# Patient Record
Sex: Female | Born: 1935 | Race: White | Hispanic: No | Smoking: Former smoker
Health system: Southern US, Community
[De-identification: ages and names within clinical notes are randomized; demographics above are authoritative.]

## PROBLEM LIST (undated history)

## (undated) DIAGNOSIS — R942 Abnormal results of pulmonary function studies: Secondary | ICD-10-CM

## (undated) DIAGNOSIS — I4891 Unspecified atrial fibrillation: Secondary | ICD-10-CM

## (undated) DIAGNOSIS — E785 Hyperlipidemia, unspecified: Secondary | ICD-10-CM

## (undated) DIAGNOSIS — R0602 Shortness of breath: Secondary | ICD-10-CM

## (undated) DIAGNOSIS — I10 Essential (primary) hypertension: Secondary | ICD-10-CM

## (undated) HISTORY — DX: Shortness of breath: R06.02

## (undated) HISTORY — DX: Essential (primary) hypertension: I10

## (undated) HISTORY — DX: Abnormal results of pulmonary function studies: R94.2

## (undated) HISTORY — DX: Hyperlipidemia, unspecified: E78.5

---
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
## 1984-10-28 HISTORY — PX: VESICOVAGINAL FISTULA CLOSURE W/ TAH: SUR271

## 1998-03-30 ENCOUNTER — Other Ambulatory Visit: Admission: RE | Admit: 1998-03-30 | Discharge: 1998-03-30 | Payer: Self-pay | Admitting: Obstetrics and Gynecology

## 1998-05-26 ENCOUNTER — Other Ambulatory Visit: Admission: RE | Admit: 1998-05-26 | Discharge: 1998-05-26 | Payer: Self-pay | Admitting: Obstetrics and Gynecology

## 1999-04-19 ENCOUNTER — Other Ambulatory Visit: Admission: RE | Admit: 1999-04-19 | Discharge: 1999-04-19 | Payer: Self-pay | Admitting: Obstetrics and Gynecology

## 2000-02-18 ENCOUNTER — Ambulatory Visit (HOSPITAL_COMMUNITY): Admission: RE | Admit: 2000-02-18 | Discharge: 2000-02-18 | Payer: Self-pay | Admitting: Cardiovascular Disease

## 2001-12-12 ENCOUNTER — Ambulatory Visit (HOSPITAL_COMMUNITY): Admission: RE | Admit: 2001-12-12 | Discharge: 2001-12-12 | Payer: Self-pay | Admitting: Specialist

## 2002-01-22 ENCOUNTER — Emergency Department (HOSPITAL_COMMUNITY): Admission: EM | Admit: 2002-01-22 | Discharge: 2002-01-22 | Payer: Self-pay | Admitting: Emergency Medicine

## 2002-01-22 ENCOUNTER — Encounter: Payer: Self-pay | Admitting: Emergency Medicine

## 2003-10-06 ENCOUNTER — Ambulatory Visit (HOSPITAL_COMMUNITY): Admission: RE | Admit: 2003-10-06 | Discharge: 2003-10-06 | Payer: Self-pay | Admitting: Specialist

## 2004-10-04 ENCOUNTER — Encounter: Admission: RE | Admit: 2004-10-04 | Discharge: 2004-10-04 | Payer: Self-pay | Admitting: Internal Medicine

## 2004-10-25 ENCOUNTER — Ambulatory Visit: Payer: Self-pay | Admitting: Gastroenterology

## 2004-10-26 ENCOUNTER — Ambulatory Visit: Payer: Self-pay | Admitting: Gastroenterology

## 2004-12-07 ENCOUNTER — Ambulatory Visit (HOSPITAL_COMMUNITY): Admission: RE | Admit: 2004-12-07 | Discharge: 2004-12-07 | Payer: Self-pay | Admitting: Gastroenterology

## 2004-12-07 ENCOUNTER — Ambulatory Visit: Payer: Self-pay | Admitting: Gastroenterology

## 2004-12-14 ENCOUNTER — Ambulatory Visit: Payer: Self-pay | Admitting: Gastroenterology

## 2008-04-03 ENCOUNTER — Ambulatory Visit: Payer: Self-pay | Admitting: Gastroenterology

## 2008-04-17 ENCOUNTER — Ambulatory Visit: Payer: Self-pay | Admitting: Gastroenterology

## 2008-04-17 ENCOUNTER — Encounter: Payer: Self-pay | Admitting: Gastroenterology

## 2008-04-18 ENCOUNTER — Encounter: Payer: Self-pay | Admitting: Gastroenterology

## 2010-07-03 ENCOUNTER — Encounter
Admission: RE | Admit: 2010-07-03 | Discharge: 2010-07-03 | Payer: Self-pay | Source: Home / Self Care | Admitting: Internal Medicine

## 2010-11-26 ENCOUNTER — Ambulatory Visit (HOSPITAL_COMMUNITY)
Admission: RE | Admit: 2010-11-26 | Discharge: 2010-11-26 | Payer: Self-pay | Source: Home / Self Care | Attending: Internal Medicine | Admitting: Internal Medicine

## 2010-12-13 ENCOUNTER — Ambulatory Visit: Payer: Self-pay | Admitting: Cardiovascular Disease

## 2010-12-14 ENCOUNTER — Telehealth (INDEPENDENT_AMBULATORY_CARE_PROVIDER_SITE_OTHER): Payer: Self-pay | Admitting: Radiology

## 2010-12-15 ENCOUNTER — Encounter (HOSPITAL_COMMUNITY)
Admission: RE | Admit: 2010-12-15 | Discharge: 2010-12-28 | Payer: Self-pay | Source: Home / Self Care | Attending: Cardiovascular Disease | Admitting: Cardiovascular Disease

## 2010-12-15 ENCOUNTER — Encounter: Payer: Self-pay | Admitting: Cardiology

## 2010-12-15 ENCOUNTER — Ambulatory Visit: Admission: RE | Admit: 2010-12-15 | Discharge: 2010-12-15 | Payer: Self-pay | Source: Home / Self Care

## 2010-12-15 ENCOUNTER — Encounter: Payer: Self-pay | Admitting: Cardiovascular Disease

## 2010-12-15 ENCOUNTER — Ambulatory Visit (HOSPITAL_COMMUNITY)
Admission: RE | Admit: 2010-12-15 | Discharge: 2010-12-15 | Payer: Self-pay | Source: Home / Self Care | Attending: Cardiovascular Disease | Admitting: Cardiovascular Disease

## 2010-12-15 ENCOUNTER — Encounter: Payer: Self-pay | Admitting: Internal Medicine

## 2010-12-17 ENCOUNTER — Telehealth: Payer: Self-pay | Admitting: Cardiovascular Disease

## 2010-12-30 NOTE — Assessment & Plan Note (Addendum)
Summary: Cardiology Nuclear Testing  Nuclear Med Background Indications for Stress Test: Evaluation for Ischemia   History: Myocardial Perfusion Study  History Comments: '05 EAV:WUJWJX, EF=77%  Symptoms: Dizziness, DOE    Nuclear Pre-Procedure Cardiac Risk Factors: Family History - CAD, History of Smoking, Hypertension, Lipids, Overweight Caffeine/Decaff Intake: None NPO After: 6:00 PM Lungs: Clear.  O2 Sat 99% on RA. IV 0.9% NS with Angio Cath: 22g     IV Site: R Antecubital IV Started by: Bonnita Levan, RN Chest Size (in) 36     Cup Size C     Height (in): 64 Weight (lb): 176 BMI: 30.32  Nuclear Med Study 1 or 2 day study:  1 day     Stress Test Type:  Stress Reading MD:  Marca Ancona, MD     Referring MD:  Kristeen Miss, MD Resting Radionuclide:  Technetium 70m Tetrofosmin     Resting Radionuclide Dose:  11.0 mCi  Stress Radionuclide:  Technetium 54m Tetrofosmin     Stress Radionuclide Dose:  33.0 mCi   Stress Protocol Exercise Time (min):  5:00 min     Max HR:  133 bpm     Predicted Max HR:  142 bpm  Max Systolic BP: 164 mm Hg     Percent Max HR:  93.66 %     METS: 6.4 Rate Pressure Product:  91478    Stress Test Technologist:  Rea [employer], CMA-N     Nuclear Technologist:  Domenic Polite, CNMT  Rest Procedure  Myocardial perfusion imaging was performed at rest 45 minutes following the intravenous administration of Technetium 71m Tetrofosmin.  Stress Procedure  The patient exercised for five minutes.  The patient stopped due to dyspnea (O2 Sat 98%) and denied any chest pain.  There were no significant ST-T wave changes, only occasional PAC's.  Technetium 27m Tetrofosmin was injected at peak exercise and myocardial perfusion imaging was performed after a brief delay.  QPS Raw Data Images:  Normal; no motion artifact; normal heart/lung ratio. Stress Images:  Normal homogeneous uptake in all areas of the myocardium. Rest Images:  Normal homogeneous uptake in all  areas of the myocardium. Subtraction (SDS):  Normal Transient Ischemic Dilatation:  1.07  (Normal <1.22)  Lung/Heart Ratio:  0.31  (Normal <0.45)  Quantitative Gated Spect Images QGS EDV:  53 ml QGS ESV:  11 ml QGS EF:  79 % QGS cine images:  Normal  Findings Normal nuclear study      Overall Impression  Exercise Capacity: Fair exercise capacity. BP Response: Normal blood pressure response. Clinical Symptoms: There is dyspnea and presyncope. Felt to be panic attack on treadmill ECG Impression: Insignificant upsloping ST segment depression. Overall Impression: Normal stress nuclear study.  Appended Document: Cardiology Nuclear Testing copy sent to Dr. Melburn Popper

## 2010-12-30 NOTE — Progress Notes (Signed)
Summary: returning call  Phone Note Call from Patient Call back at [facility] Phone [phone]   Caller: Patient Summary of Call: Pt returning call Initial call taken by: Judie Grieve,  December 17, 2010 2:26 PM  Follow-up for Phone Call        patient aware of test results. Whitney Maeola Sarah RN  December 20, 2010 8:26 AM  Follow-up by: Whitney Maeola Sarah RN,  December 20, 2010 8:26 AM

## 2010-12-30 NOTE — Progress Notes (Signed)
Summary: nuc pre-procedure  Phone Note Outgoing Call   Call placed by: Domenic Polite, CNMT,  December 14, 2010 2:16 PM Call placed to: Patient Reason for Call: Confirm/change Appt Summary of Call: Left message with information on Myoview Information Sheet (see scanned document for details).      Nuclear Med Background Indications for Stress Test: Evaluation for Ischemia   History: Myocardial Perfusion Study  History Comments: '05 MPS-nml. / EF = 77%  Symptoms: DOE    Nuclear Pre-Procedure Cardiac Risk Factors: History of Smoking, Lipids

## 2011-01-08 ENCOUNTER — Emergency Department (HOSPITAL_COMMUNITY)
Admission: EM | Admit: 2011-01-08 | Discharge: 2011-01-08 | Disposition: A | Discharge status: Home / Self Care | Payer: Medicare Other | Attending: Emergency Medicine | Admitting: Emergency Medicine

## 2011-01-08 DIAGNOSIS — K5289 Other specified noninfective gastroenteritis and colitis: Secondary | ICD-10-CM | POA: Insufficient documentation

## 2011-01-08 DIAGNOSIS — I10 Essential (primary) hypertension: Secondary | ICD-10-CM | POA: Insufficient documentation

## 2011-01-08 LAB — URINALYSIS, ROUTINE W REFLEX MICROSCOPIC
Leukocytes, UA: NEGATIVE
Protein, ur: NEGATIVE mg/dL
Specific Gravity, Urine: 1.023 (ref 1.005–1.030)
Urine Glucose, Fasting: NEGATIVE mg/dL
Urobilinogen, UA: 0.2 mg/dL (ref 0.0–1.0)

## 2011-01-08 LAB — COMPREHENSIVE METABOLIC PANEL
AST: 28 U/L (ref 0–37)
Albumin: 3.9 g/dL (ref 3.5–5.2)
BUN: 22 mg/dL (ref 6–23)
Calcium: 9.1 mg/dL (ref 8.4–10.5)
Chloride: 104 mEq/L (ref 96–112)
Creatinine, Ser: 1.24 mg/dL — ABNORMAL HIGH (ref 0.4–1.2)
GFR calc Af Amer: 51 mL/min — ABNORMAL LOW (ref >60)
GFR calc non Af Amer: 42 mL/min — ABNORMAL LOW (ref >60)
Total Bilirubin: 1 mg/dL (ref 0.3–1.2)

## 2011-01-08 LAB — URINE MICROSCOPIC-ADD ON

## 2011-01-08 LAB — POCT CARDIAC MARKERS
CKMB, poc: <1 ng/mL — ABNORMAL LOW (ref 1.0–8.0)
Myoglobin, poc: 114 ng/mL (ref 12–200)
Troponin i, poc: 0.08 ng/mL (ref 0.00–0.09)

## 2011-04-01 ENCOUNTER — Encounter: Payer: Self-pay | Admitting: Cardiovascular Disease

## 2011-04-01 DIAGNOSIS — I8390 Asymptomatic varicose veins of unspecified lower extremity: Secondary | ICD-10-CM | POA: Insufficient documentation

## 2011-04-01 DIAGNOSIS — R942 Abnormal results of pulmonary function studies: Secondary | ICD-10-CM | POA: Insufficient documentation

## 2011-04-01 DIAGNOSIS — R0602 Shortness of breath: Secondary | ICD-10-CM | POA: Insufficient documentation

## 2011-04-01 DIAGNOSIS — R079 Chest pain, unspecified: Secondary | ICD-10-CM | POA: Insufficient documentation

## 2011-04-01 DIAGNOSIS — E785 Hyperlipidemia, unspecified: Secondary | ICD-10-CM | POA: Insufficient documentation

## 2011-04-01 DIAGNOSIS — I480 Paroxysmal atrial fibrillation: Secondary | ICD-10-CM | POA: Insufficient documentation

## 2011-04-07 ENCOUNTER — Encounter: Payer: Self-pay | Admitting: Cardiovascular Disease

## 2011-04-07 ENCOUNTER — Ambulatory Visit (INDEPENDENT_AMBULATORY_CARE_PROVIDER_SITE_OTHER): Payer: Medicare Other | Admitting: Cardiovascular Disease

## 2011-04-07 DIAGNOSIS — R079 Chest pain, unspecified: Secondary | ICD-10-CM

## 2011-04-07 NOTE — Progress Notes (Signed)
Cheryl Peterson Date of Birth  07/21/1930 [facility] [address]     [address] [place], [place]  [address] [phone]  Fax  [phone]  History of Present Illness:  Cheryl Peterson presents today for followup visit. I saw her several months ago. She been having some atypical episodes of chest pain. We performed an echocardiogram which revealed normal left ventricular systolic function. She also had a stress Myoview study which revealed no evidence of ischemia.  She's felt fairly well since I last saw her.  Current Outpatient Prescriptions on File Prior to Visit  Medication Sig Dispense Refill  . atorvastatin (LIPITOR) 10 MG tablet Take 10 mg by mouth daily.        . Calcium-Vitamin D (CALTRATE 600 PLUS-VIT D PO) Take by mouth daily.        Marland Kitchen lisinopril (PRINIVIL,ZESTRIL) 20 MG tablet Take 20 mg by mouth daily.        . Misc Natural Products (BUTCHERS BROOM PO) Take 470 mg by mouth daily. 3 DAILY       . Multiple Vitamin (MULTIVITAMIN) tablet Take 1 tablet by mouth daily.        Marland Kitchen PARoxetine (PAXIL) 20 MG tablet Take 20 mg by mouth every morning.          Allergies  Allergen Reactions  . Penicillins     Past Medical History  Diagnosis Date  . Chest pain   . SOB (shortness of breath)   . Decreased diffusion capacity   . Hypertension   . Hyperlipidemia     Past Surgical History  Procedure Date  . Tonsillectomy   . Vesicovaginal fistula closure w/ tah 10/28/84    History  Smoking status  . Former Smoker  . Quit date: 11/28/1978  Smokeless tobacco  . Not on file    History  Alcohol Use No    Family History  Problem Relation Age of Onset  . Stroke Mother   . Hypertension Mother   . Deep vein thrombosis Father   . Clotting disorder Sister   . Heart disease Sister   . Heart failure Sister   . Clotting disorder Brother   . Heart disease Brother   . Heart failure Brother     Reviw of Systems:  Reviewed in the HPI.  All other  systems are negative.  Physical Exam: BP 128/72  Pulse 64  Wt 178 lb (80.74 kg) The patient is alert and oriented x 3.  The mood and affect are normal.  The skin is warm and dry.  Color is normal.  The HEENT exam reveals that the sclera are nonicteric.  The mucous membranes are moist.  The carotids are 2+ with a very soft fight bruit.  There is no thyromegaly.  There is no JVD.  The lungs are clear.  The chest wall is non tender.  The heart exam reveals a regular rate with a normal S1 and S2.  There are no murmurs, gallops, or rubs.  The PMI is not displaced.   Abdominal exam reveals good bowel sounds.  There is no guarding or rebound.  There is no hepatosplenomegaly or tenderness.  There are no masses.  Exam of the legs reveal no clubbing, cyanosis, or edema.  The legs are without rashes.  The distal pulses are intact.  Cranial nerves II - XII are intact.  Motor and sensory functions are intact.  The gait is normal.  ECG:  Assessment / Plan:

## 2011-04-07 NOTE — Assessment & Plan Note (Signed)
She's not had any recent episodes of chest pain. A recent stress test and echocardiogram were negative. We will continue with her same medications and I'll see her again in one year.

## 2011-04-15 NOTE — Op Note (Signed)
NAME:  Cheryl Peterson, Cheryl Peterson                 ACCOUNT NO.:  [id number]   MEDICAL RECORD NO.:  [id number]          PATIENT TYPE:  AMB   LOCATION:                               FACILITY:  [facility]   PHYSICIAN:  Malcolm T. Russella Dar, M.D. [facility] OF BIRTH:  Apr 08, 1930   DATE OF PROCEDURE:  12/07/2004  DATE OF DISCHARGE:                                 OPERATIVE REPORT   PROCEDURE PERFORMED:  Esophageal manometry.   INDICATIONS FOR PROCEDURE:  The patient is a 77 year old white female with  dysphagia.   Upper esophageal sphincter study reveals normal pressures and normal  relaxation.   Examination of esophageal body reveals about 90% of swallows resulting in  peristaltic contractions.  A small percentage were simultaneous and a small  percentage were nontransmitted.  The pressures appeared normal.   The lower esophageal sphincter had a mildly elevated resting pressure at  50.5 mmHg with normal relaxation with a residual pressure of 7.1 mmHg.   IMPRESSION:  Mildly hypertensive lower esophageal sphincter.   RECOMMENDATIONS:  Treat for gastroesophageal reflux disease with a proton  pump inhibitor and monitor her symptomatic response.      MTS/MEDQ  D:  12/14/2004  T:  12/14/2004  Job:  [id number]   cc:   Kari Baars, M.D.  [address]  [place], [place] [address]  Fax: [phone]

## 2011-08-29 ENCOUNTER — Emergency Department (HOSPITAL_COMMUNITY): Payer: Medicare Other

## 2011-08-29 ENCOUNTER — Inpatient Hospital Stay (HOSPITAL_COMMUNITY)
Admission: EM | Admit: 2011-08-29 | Discharge: 2011-08-31 | DRG: 315 | Disposition: A | Discharge status: Home / Self Care | Payer: Medicare Other | Attending: Internal Medicine | Admitting: Internal Medicine

## 2011-08-29 DIAGNOSIS — R51 Headache: Secondary | ICD-10-CM | POA: Diagnosis present

## 2011-08-29 DIAGNOSIS — I10 Essential (primary) hypertension: Secondary | ICD-10-CM | POA: Diagnosis present

## 2011-08-29 DIAGNOSIS — R55 Syncope and collapse: Secondary | ICD-10-CM | POA: Diagnosis present

## 2011-08-29 DIAGNOSIS — Z860100 Personal history of colon polyps, unspecified: Secondary | ICD-10-CM

## 2011-08-29 DIAGNOSIS — T46905A Adverse effect of unspecified agents primarily affecting the cardiovascular system, initial encounter: Secondary | ICD-10-CM | POA: Diagnosis present

## 2011-08-29 DIAGNOSIS — K559 Vascular disorder of intestine, unspecified: Secondary | ICD-10-CM | POA: Diagnosis present

## 2011-08-29 DIAGNOSIS — R197 Diarrhea, unspecified: Secondary | ICD-10-CM | POA: Diagnosis present

## 2011-08-29 DIAGNOSIS — R7301 Impaired fasting glucose: Secondary | ICD-10-CM | POA: Diagnosis present

## 2011-08-29 DIAGNOSIS — N179 Acute kidney failure, unspecified: Secondary | ICD-10-CM | POA: Diagnosis present

## 2011-08-29 DIAGNOSIS — E785 Hyperlipidemia, unspecified: Secondary | ICD-10-CM | POA: Diagnosis present

## 2011-08-29 DIAGNOSIS — I959 Hypotension, unspecified: Principal | ICD-10-CM | POA: Diagnosis present

## 2011-08-29 DIAGNOSIS — Z87891 Personal history of nicotine dependence: Secondary | ICD-10-CM

## 2011-08-29 DIAGNOSIS — F411 Generalized anxiety disorder: Secondary | ICD-10-CM | POA: Diagnosis present

## 2011-08-29 DIAGNOSIS — K5730 Diverticulosis of large intestine without perforation or abscess without bleeding: Secondary | ICD-10-CM | POA: Diagnosis present

## 2011-08-29 DIAGNOSIS — G3184 Mild cognitive impairment, so stated: Secondary | ICD-10-CM | POA: Diagnosis present

## 2011-08-29 DIAGNOSIS — Z79899 Other long term (current) drug therapy: Secondary | ICD-10-CM

## 2011-08-29 DIAGNOSIS — K869 Disease of pancreas, unspecified: Secondary | ICD-10-CM | POA: Diagnosis present

## 2011-08-29 LAB — COMPREHENSIVE METABOLIC PANEL
ALT: 9 U/L (ref 0–35)
AST: 14 U/L (ref 0–37)
CO2: 27 mEq/L (ref 19–32)
Calcium: 8.6 mg/dL (ref 8.4–10.5)
Chloride: 102 mEq/L (ref 96–112)
GFR calc non Af Amer: 30 mL/min — ABNORMAL LOW (ref >90)
Potassium: 4.5 mEq/L (ref 3.5–5.1)
Sodium: 138 mEq/L (ref 135–145)

## 2011-08-29 LAB — CBC
MCH: 30.6 pg (ref 26.0–34.0)
MCHC: 33.7 g/dL (ref 30.0–36.0)
MCV: 90.8 fL (ref 78.0–100.0)
Platelets: 203 10*3/uL (ref 150–400)
RDW: 13 % (ref 11.5–15.5)

## 2011-08-29 LAB — CK TOTAL AND CKMB (NOT AT ARMC): Relative Index: INVALID (ref 0.0–2.5)

## 2011-08-29 LAB — URINALYSIS, ROUTINE W REFLEX MICROSCOPIC
Bilirubin Urine: NEGATIVE
Nitrite: NEGATIVE
Protein, ur: NEGATIVE mg/dL
Urobilinogen, UA: 0.2 mg/dL (ref 0.0–1.0)

## 2011-08-29 LAB — URINE MICROSCOPIC-ADD ON

## 2011-08-29 LAB — DIFFERENTIAL
Basophils Relative: 0 % (ref 0–1)
Eosinophils Absolute: 0.1 10*3/uL (ref 0.0–0.7)
Eosinophils Relative: 1 % (ref 0–5)
Lymphs Abs: 1 10*3/uL (ref 0.7–4.0)
Monocytes Relative: 6 % (ref 3–12)

## 2011-08-29 LAB — LIPASE, BLOOD: Lipase: 17 U/L (ref 11–59)

## 2011-08-29 MED ORDER — IOHEXOL 300 MG/ML  SOLN
60.0000 mL | Freq: Once | INTRAMUSCULAR | Status: AC | PRN
  Administered 2011-08-29: 60 mL via INTRAVENOUS

## 2011-08-30 LAB — CARDIAC PANEL(CRET KIN+CKTOT+MB+TROPI)
CK, MB: 1.5 ng/mL (ref 0.3–4.0)
Relative Index: INVALID (ref 0.0–2.5)
Total CK: 50 U/L (ref 7–177)
Total CK: 60 U/L (ref 7–177)

## 2011-08-30 LAB — OCCULT BLOOD, POC DEVICE: Fecal Occult Bld: NEGATIVE

## 2011-08-30 LAB — TROPONIN I: Troponin I: <0.3 ng/mL (ref <0.30)

## 2011-08-30 LAB — CK TOTAL AND CKMB (NOT AT ARMC): Total CK: 49 U/L (ref 7–177)

## 2011-08-30 LAB — LACTIC ACID, PLASMA: Lactic Acid, Venous: 0.7 mmol/L (ref 0.5–2.2)

## 2011-08-31 LAB — BASIC METABOLIC PANEL
BUN: 12 mg/dL (ref 6–23)
Creatinine, Ser: 1.12 mg/dL — ABNORMAL HIGH (ref 0.50–1.10)
GFR calc Af Amer: 53 mL/min — ABNORMAL LOW (ref >90)
GFR calc non Af Amer: 46 mL/min — ABNORMAL LOW (ref >90)

## 2011-08-31 LAB — CBC
HCT: 31.6 % — ABNORMAL LOW (ref 36.0–46.0)
MCHC: 32.6 g/dL (ref 30.0–36.0)
MCV: 92.7 fL (ref 78.0–100.0)
Platelets: 179 10*3/uL (ref 150–400)
RDW: 13.1 % (ref 11.5–15.5)
WBC: 4.7 10*3/uL (ref 4.0–10.5)

## 2011-08-31 LAB — URINE CULTURE

## 2011-09-05 LAB — CULTURE, BLOOD (ROUTINE X 2)
Culture  Setup Time: 201210020957
Culture: NO GROWTH

## 2012-04-02 ENCOUNTER — Ambulatory Visit (INDEPENDENT_AMBULATORY_CARE_PROVIDER_SITE_OTHER): Payer: Medicare Other | Admitting: Cardiovascular Disease

## 2012-04-02 ENCOUNTER — Encounter: Payer: Self-pay | Admitting: Cardiovascular Disease

## 2012-04-02 VITALS — BP 131/69 | HR 84 | Ht 64.0 in | Wt 180.8 lb

## 2012-04-02 DIAGNOSIS — R079 Chest pain, unspecified: Secondary | ICD-10-CM

## 2012-04-02 DIAGNOSIS — E785 Hyperlipidemia, unspecified: Secondary | ICD-10-CM

## 2012-04-02 DIAGNOSIS — R0602 Shortness of breath: Secondary | ICD-10-CM

## 2012-04-02 NOTE — Progress Notes (Signed)
Cheryl Peterson Date of Birth  [birth year]       [facility] Office [address] [place], [place]  [address] [phone]   Fax  [phone]   [facility] [address] [place], [place]  [address] [phone]  Fax [phone]  Problem List: 1. History of chest pain- stress Myoview study from January, 2012 revealed normal left systolic function with an EF of 70%. She had no ischemia. Echocardiogram performed was also normal. 2. Hyperlipidemia 3. Dyspnea  History of Present Illness:  She continues to have dyspnea and some balance issues. A stress Myoview and echocardiogram performed approximately one year ago were both completely normal.  She works out approximately once or twice a month. She does the treadmill or perhaps the stationary bike.  She has never exercised regularly.  Current Outpatient Prescriptions on File Prior to Visit  Medication Sig Dispense Refill  . atorvastatin (LIPITOR) 10 MG tablet Take 10 mg by mouth daily.        . Calcium-Vitamin D (CALTRATE 600 PLUS-VIT D PO) Take by mouth daily.        Marland Kitchen lisinopril (PRINIVIL,ZESTRIL) 20 MG tablet Take 20 mg by mouth daily.        . Misc Natural Products (BUTCHERS BROOM PO) Take 470 mg by mouth daily. 3 DAILY       . Multiple Vitamin (MULTIVITAMIN) tablet Take 1 tablet by mouth daily.        Marland Kitchen PARoxetine (PAXIL) 20 MG tablet Take 20 mg by mouth every morning.          Allergies  Allergen Reactions  . Penicillins     Past Medical History  Diagnosis Date  . Chest pain   . SOB (shortness of breath)   . Decreased diffusion capacity   . Hypertension   . Hyperlipidemia     Past Surgical History  Procedure Date  . Tonsillectomy   . Vesicovaginal fistula closure w/ tah 10/28/84    History  Smoking status  . Former Smoker  . Quit date: 11/28/1978  Smokeless tobacco  . Not on file    History  Alcohol Use No    Family History  Problem Relation Age of Onset  .  Stroke Mother   . Hypertension Mother   . Deep vein thrombosis Father   . Clotting disorder Sister   . Heart disease Sister   . Heart failure Sister   . Clotting disorder Brother   . Heart disease Brother   . Heart failure Brother     Reviw of Systems:  Reviewed in the HPI.  All other systems are negative.  Physical Exam: Blood pressure 131/69, pulse 84, height 5\' 4"  (1.626 m), weight 180 lb 12.8 oz (82.01 kg). General: Well developed, well nourished, in no acute distress.  Head: Normocephalic, atraumatic, sclera non-icteric, mucus membranes are moist,   Neck: Supple. Carotids are 2 + without bruits. No JVD  Lungs: Clear bilaterally to auscultation.  Heart: regular rate.  normal  S1 S2. No murmurs, gallops or rubs.  Abdomen: Soft, non-tender, non-distended with normal bowel sounds. No hepatomegaly. No rebound/guarding. No masses.  Msk:    normal  Extremities: No clubbing or cyanosis. No edema.  Distal pedal pulses are normal.   Neuro: Alert and oriented X 3. Moves all extremities spontaneously.  Psych:  Responds to questions appropriately with a normal affect.  ECG: Apr 02, 2012-normal sinus rhythm at 81 beats a minute. EKG is normal.  Assessment /  Plan:

## 2012-04-02 NOTE — Patient Instructions (Signed)
Your physician wants you to follow-up in: 1 YEAR You will receive a reminder letter in the mail two months in advance. If you don't receive a letter, please call our office to schedule the follow-up appointment.  Your physician recommends that you return for a FASTING lipid profile: 1 YEAR OR BRING A COPY OF YOUR CHOLESTEROL RESULTS IF DONE BY DR Clelia Croft

## 2012-04-02 NOTE — Assessment & Plan Note (Signed)
She is doing well from a cardiac stand point.  I have recommended that she start on a regular exercise program.  I will see her again in 1 year.

## 2012-12-13 ENCOUNTER — Encounter: Payer: Self-pay | Admitting: Cardiovascular Disease

## 2012-12-14 ENCOUNTER — Encounter: Payer: Self-pay | Admitting: Cardiovascular Disease

## 2012-12-17 ENCOUNTER — Encounter: Payer: Self-pay | Admitting: Cardiovascular Disease

## 2013-04-01 ENCOUNTER — Encounter: Payer: Self-pay | Admitting: Gastroenterology

## 2013-04-03 ENCOUNTER — Encounter: Payer: Self-pay | Admitting: Gastroenterology

## 2013-05-28 ENCOUNTER — Encounter: Payer: Self-pay | Admitting: Gastroenterology

## 2013-05-28 ENCOUNTER — Ambulatory Visit (AMBULATORY_SURGERY_CENTER): Payer: Medicare Other | Admitting: *Deleted

## 2013-05-28 VITALS — Ht 64.0 in | Wt 160.0 lb

## 2013-05-28 DIAGNOSIS — Z86010 Personal history of colonic polyps: Secondary | ICD-10-CM

## 2013-05-28 MED ORDER — MOVIPREP 100 G PO SOLR: ORAL | Status: DC

## 2013-06-03 ENCOUNTER — Encounter: Payer: Self-pay | Admitting: Gastroenterology

## 2013-06-11 ENCOUNTER — Encounter: Payer: Self-pay | Admitting: Gastroenterology

## 2013-06-11 ENCOUNTER — Ambulatory Visit (AMBULATORY_SURGERY_CENTER): Payer: Medicare Other | Admitting: Gastroenterology

## 2013-06-11 VITALS — BP 141/71 | HR 63 | Temp 96.4°F | Resp 11 | Ht 64.0 in | Wt 160.0 lb

## 2013-06-11 DIAGNOSIS — Z86010 Personal history of colonic polyps: Secondary | ICD-10-CM

## 2013-06-11 MED ORDER — SODIUM CHLORIDE 0.9 % IV SOLN: 500.0000 mL | INTRAVENOUS | Status: DC

## 2013-06-11 NOTE — Progress Notes (Signed)
Patient did not experience any of the following events: a burn prior to discharge; a fall within the facility; wrong site/side/patient/procedure/implant event; or a hospital transfer or hospital admission upon discharge from the facility. (G8907) Patient did not have preoperative order for IV antibiotic SSI prophylaxis. (G8918)  

## 2013-06-11 NOTE — Progress Notes (Signed)
Lidocaine-40mg IV prior to Propofol InductionPropofol given over incremental dosages 

## 2013-06-11 NOTE — Op Note (Signed)
[facility] [address] [place], [address]   COLONOSCOPY PROCEDURE REPORT  PATIENT: Cheryl, Peterson  MR#: [id number] BIRTHDATE: [birth year] , 82  yrs. old GENDER: Female ENDOSCOPIST: Meryl Dare, MD, [facility] PROCEDURE DATE:  06/11/2013 PROCEDURE:   Colonoscopy, screening ASA CLASS:   Class II INDICATIONS:Patient's personal history of adenomatous colon polyps and Last colonoscopy performed 5 years ago. MEDICATIONS: MAC sedation, administered by CRNA and propofol (Diprivan) 150mg  IV DESCRIPTION OF PROCEDURE:   After the risks benefits and alternatives of the procedure were thoroughly explained, informed consent was obtained.  A digital rectal exam revealed no abnormalities of the rectum.   The LB [id number]  endoscope was introduced through the anus and advanced to the cecum, which was identified by both the appendix and ileocecal valve. No adverse events experienced.   The quality of the prep was good, using MoviPrep  The instrument was then slowly withdrawn as the colon was fully examined.  COLON FINDINGS: Moderate diverticulosis was noted in the descending colon and sigmoid colon.   The colon was otherwise normal.  There was no diverticulosis, inflammation, polyps or cancers unless previously stated.  Retroflexed views revealed small internal hemorrhoids. The time to cecum=5 minutes 00 seconds.  Withdrawal time=11 minutes 00 seconds.  The scope was withdrawn and the procedure completed.  COMPLICATIONS: There were no complications.  ENDOSCOPIC IMPRESSION: 1.   Moderate diverticulosis was noted in the descending colon and sigmoid colon 2.   Small internal hemorrhoids  RECOMMENDATIONS: 1.  High fiber diet with liberal fluid intake. 2.  Given your age, you will not need another colonoscopy for colon cancer screening or polyp surveillance.  These types of tests usually stop around the age 54.   eSigned:  Meryl Dare, MD, Clementeen Graham 06/11/2013 10:08  AM   cc: Kari Baars, MD

## 2013-06-11 NOTE — Patient Instructions (Addendum)

## 2013-06-12 ENCOUNTER — Telehealth: Payer: Self-pay | Admitting: *Deleted

## 2013-06-12 NOTE — Telephone Encounter (Signed)
  Follow up Call-  Call back number 06/11/2013  Post procedure Call Back phone  # [phone]  Permission to leave phone message Yes     Patient questions:  Do you have a fever, pain , or abdominal swelling? yes Pain Score  0 *  Have you tolerated food without any problems? yes  Have you been able to return to your normal activities? yes  Do you have any questions about your discharge instructions: Diet   no Medications  no Follow up visit  no  Do you have questions or concerns about your Care? no  Actions: * If pain score is 4 or above: No action needed, pain <4.pt did c/o weakness and dizziness last night for awhile approx. 6:30pm. Pt says she is not dizzy like that this morning and is not having any other problems. Encouraged pt to drink plenty of fluids today and call us back if this does not keep improving.

## 2013-11-15 ENCOUNTER — Encounter: Payer: Self-pay | Admitting: Cardiology

## 2014-07-04 ENCOUNTER — Ambulatory Visit (HOSPITAL_COMMUNITY)
Admission: RE | Admit: 2014-07-04 | Discharge: 2014-07-04 | Disposition: A | Discharge status: Home / Self Care | Payer: Medicare Other | Source: Ambulatory Visit | Attending: Surgery | Admitting: Surgery

## 2014-07-04 ENCOUNTER — Other Ambulatory Visit (HOSPITAL_COMMUNITY): Payer: Self-pay | Admitting: Internal Medicine

## 2014-07-04 DIAGNOSIS — R0989 Other specified symptoms and signs involving the circulatory and respiratory systems: Secondary | ICD-10-CM | POA: Insufficient documentation

## 2015-06-03 ENCOUNTER — Encounter: Payer: Self-pay | Admitting: Gastroenterology

## 2015-06-25 ENCOUNTER — Ambulatory Visit: Payer: Medicare Other | Attending: Internal Medicine | Admitting: Physical Therapy

## 2015-06-25 ENCOUNTER — Encounter: Payer: Self-pay | Admitting: Physical Therapy

## 2015-06-25 DIAGNOSIS — R269 Unspecified abnormalities of gait and mobility: Secondary | ICD-10-CM | POA: Diagnosis not present

## 2015-06-26 ENCOUNTER — Encounter: Payer: Self-pay | Admitting: Physical Therapy

## 2015-06-26 NOTE — Therapy (Signed)
[facility] [address] [place], [place], [address] Phone: [phone]   Fax:  [phone]  Physical Therapy Evaluation  Patient Details  Name: Cheryl Peterson MRN: [id number] Date of Birth: Jun 11, 1930 Referring Provider:  Martha Clan, MD  Encounter Date: 06/25/2015      PT End of Session - 06/26/15 1026    Visit Number 1   Number of Visits 9   Date for PT Re-Evaluation 07/26/15   Authorization Type [employer] Medicare   Authorization Time Period 06-25-15 - 08-24-15   PT Start Time 1103   PT Stop Time 1152   PT Time Calculation (min) 49 min      Past Medical History  Diagnosis Date  . Chest pain   . SOB (shortness of breath)   . Decreased diffusion capacity   . Hypertension   . Hyperlipidemia     Past Surgical History  Procedure Laterality Date  . Tonsillectomy  1939  . Vesicovaginal fistula closure w/ tah  10/28/84    There were no vitals filed for this visit.  Visit Diagnosis:  Abnormality of gait - Plan: PT plan of care cert/re-cert      Subjective Assessment - 06/25/15 1026    Subjective Pt. reports problems with walking which has progressively worsened within past several years; was evaluated by ENT to rule out Meniere's disease, Dr. Jearld Fenton ruled out; pt reports difficulties with walking down inclines, descension on steps, and unable to walk in straight path on a sidewalk                                                                                                                                                                                             Pertinent History Paroxysmal atrial fibrillation, arthritis   Patient Stated Goals Improve balance   Currently in Pain? No/denies            [facility] PT Assessment - 06/26/15 0001    Assessment   Medical Diagnosis Gait abnormality   Onset Date/Surgical Date --  worsened over past couple of years (2014)   Precautions   Precautions Fall   Balance  Screen   Has the patient fallen in the past 6 months Yes   How many times? 1   Has the patient had a decrease in activity level because of a fear of falling?  No   Is the patient reluctant to leave their home because of a fear of falling?  No   Home Environment   Living Environment Private residence   Type of Home House   Home Access Level entry   Prior  Function   Level of Independence Independent with community mobility without device   ROM / Strength   AROM / PROM / Strength Strength   Strength   Overall Strength Within functional limits for tasks performed   Ambulation/Gait   Ambulation/Gait Yes   Ambulation/Gait Assistance 6: Modified independent (Device/Increase time)   Ambulation Distance (Feet) 100 Feet   Assistive device --  none   Gait Pattern Wide base of support;Decreased step length - right;Decreased step length - left   Ambulation Surface Level;Indoor   Gait velocity 3.35   9.78 secs   Stairs Yes   Stairs Assistance 6: Modified independent (Device/Increase time)   Stair Management Technique Two rails;Alternating pattern   Number of Stairs 4   Height of Stairs 6   Ramp 5: Supervision   Berg Balance Test   Sit to Stand Able to stand without using hands and stabilize independently   Standing Unsupported Able to stand safely 2 minutes   Sitting with Back Unsupported but Feet Supported on Floor or Stool Able to sit safely and securely 2 minutes   Stand to Sit Sits safely with minimal use of hands   Transfers Able to transfer safely, minor use of hands   Standing Unsupported with Eyes Closed Able to stand 10 seconds with supervision   Standing Ubsupported with Feet Together Able to place feet together independently and stand for 1 minute with supervision   From Standing, Reach Forward with Outstretched Arm Can reach confidently >25 cm (10")   From Standing Position, Pick up Object from Floor Able to pick up shoe, needs supervision   From Standing Position, Turn to Look  Behind Over each Shoulder Turn sideways only but maintains balance   Turn 360 Degrees Able to turn 360 degrees safely in 4 seconds or less   Standing Unsupported, Alternately Place Feet on Step/Stool Able to complete >2 steps/needs minimal assist   Standing Unsupported, One Foot in Front Able to take small step independently and hold 30 seconds   Standing on One Leg Tries to lift leg/unable to hold 3 seconds but remains standing independently   Total Score 43   Timed Up and Go Test   Normal TUG (seconds) 10.38  no device used                           PT Education - 06/26/15 1024    Education provided Yes   Education Details instructed in fall prevention techniques and compensation techniques for decr. vestibular function, i.e. use of night lights at night for incr. visual input   Person(s) Educated Patient   Methods Explanation;Demonstration   Comprehension Verbalized understanding             PT Long Term Goals - 06/26/15 1046    PT LONG TERM GOAL #1   Title Pt. will improve Berg balance test score to >/= 48/56 to decr. fall risk  (target date 07-26-15)   Baseline 43/56   Time 4   Period Weeks   Status New   PT LONG TERM GOAL #2   Title Pt. will negotiate steps using a step over step sequence with use of 1 hand rail to demo improved balance.   (07-26-15)   Baseline 2 rails required   Time 4   Period Weeks   Status New   PT LONG TERM GOAL #3   Title Improve Functional gait test score by at least 3 points to demo improved  balance with amb.  (07-26-15)   Baseline TBA   Time 4   Period Weeks   Status New   PT LONG TERM GOAL #4   Title Improve FOTO by at least 10 points for improved pt satisfaction  (07-26-15)   Baseline To be completed   Time 4   Period Weeks   Status New   PT LONG TERM GOAL #5   Title Independent in HEP for balance exercises   (07-26-15)   Time 4   Period Weeks   Status New               Plan - 06/26/15 1039    Clinical  Impression Statement Pt. presents with gait and balance deficits with deviations including wide BOS with difficulty maintaining balance with narrow BOS; pt. also has difficulty amb. at slower speeds and is unable to perform single limb stance for greater than 1 sec on either leg;  pt reports diffculty amb. down declines and reports difficulty/anxiety with heights if not enclosed; pt would benefit from neurology consult to further evaluate movement disorder                                                                                                                                                                                                                       Pt will benefit from skilled therapeutic intervention in order to improve on the following deficits Abnormal gait;Decreased balance;Decreased mobility;Decreased strength;Decreased activity tolerance   Rehab Potential Good   PT Frequency 2x / week   PT Duration 4 weeks   PT Treatment/Interventions ADLs/Self Care Home Management;Functional mobility training;Stair training;Gait training;Therapeutic activities;Therapeutic exercise;Balance training;Neuromuscular re-education;Patient/family education   PT Next Visit Plan do functional gait assessment; begin HEP for balance and core stabilization   PT Home Exercise Plan balance   Consulted and Agree with Plan of Care Patient          G-Codes - Jul 11, 2015 1053    Functional Assessment Tool Used TUG score 10.38 secs; gait velocity 3.35 ft/sec;  Berg score 43/56; significant gait deviations present including wide BOS   Functional Limitation Mobility: Walking and moving around   Mobility: Walking and Moving Around Current Status [id number]) At least 40 percent but less than 60 percent impaired, limited or restricted   Mobility: Walking and Moving Around Goal Status (U0454) At least 20 percent but less than 40 percent impaired, limited or restricted     Pt would benefit from a neurology consult to  further  assess movement disorder/gait abnormality - pt agrees with  this recommendation  Problem List Patient Active Problem List   Diagnosis Date Noted  . Chest pain   . SOB (shortness of breath)   . Hyperlipidemia   . PAF (paroxysmal atrial fibrillation)   . Varicose veins   . Decreased diffusion capacity     Kary Kos, PT 06/26/2015, 10:59 AM  [facility] [address] [place], [place], [address] Phone: [phone]   Fax:  [phone]

## 2015-06-29 ENCOUNTER — Encounter: Payer: Self-pay | Admitting: Physical Therapy

## 2015-06-29 ENCOUNTER — Ambulatory Visit: Payer: Medicare Other | Attending: Internal Medicine | Admitting: Physical Therapy

## 2015-06-29 DIAGNOSIS — R279 Unspecified lack of coordination: Secondary | ICD-10-CM | POA: Diagnosis present

## 2015-06-29 DIAGNOSIS — R269 Unspecified abnormalities of gait and mobility: Secondary | ICD-10-CM | POA: Diagnosis present

## 2015-06-29 NOTE — Therapy (Signed)
[facility] [address] [place], [place], [address] Phone: [phone]   Fax:  [phone]  Physical Therapy Treatment  Patient Details  Name: Cheryl Peterson MRN: [id number] Date of Birth: 09-09-1930 Referring Provider:  Martha Clan, MD  Encounter Date: 06/29/2015      PT End of Session - 06/29/15 1349    Visit Number 2  G2   Number of Visits 9   Date for PT Re-Evaluation 07/26/15   Authorization Type [employer] Medicare   Authorization Time Period 06-25-15 - 08-24-15   PT Start Time 0931   PT Stop Time 1029   PT Time Calculation (min) 58 min      Past Medical History  Diagnosis Date  . Chest pain   . SOB (shortness of breath)   . Decreased diffusion capacity   . Hypertension   . Hyperlipidemia     Past Surgical History  Procedure Laterality Date  . Tonsillectomy  1939  . Vesicovaginal fistula closure w/ tah  10/28/84    There were no vitals filed for this visit.  Visit Diagnosis:  Abnormality of gait  Lack of coordination      Subjective Assessment - 06/29/15 1339    Subjective Pt. reports she has much more difficulty with walking slowly than she does with fast ambulation; reports no new changes since initial eval last week    Pertinent History Paroxysmal atrial fibrillation, arthritis   Patient Stated Goals Improve balance   Currently in Pain? No/denies            [facility] PT Assessment - 06/29/15 0932    Functional Gait  Assessment   Gait assessed  Yes   Gait Level Surface Walks 20 ft in less than 5.5 sec, no assistive devices, good speed, no evidence for imbalance, normal gait pattern, deviates no more than 6 in outside of the 12 in walkway width.  5.4, 5.37 secs   Change in Gait Speed Able to change speed, demonstrates mild gait deviations, deviates 6-10 in outside of the 12 in walkway width, or no gait deviations, unable to achieve a major change in velocity, or uses a change in velocity, or  uses an assistive device.   Gait with Horizontal Head Turns Performs head turns with moderate changes in gait velocity, slows down, deviates 10-15 in outside 12 in walkway width but recovers, can continue to walk.   Gait with Vertical Head Turns Performs task with moderate change in gait velocity, slows down, deviates 10-15 in outside 12 in walkway width but recovers, can continue to walk.   Gait and Pivot Turn Pivot turns safely within 3 sec and stops quickly with no loss of balance.   Step Over Obstacle Is able to step over one shoe box (4.5 in total height) without changing gait speed. No evidence of imbalance.   Gait with Narrow Base of Support Ambulates less than 4 steps heel to toe or cannot perform without assistance.   Gait with Eyes Closed Walks 20 ft, slow speed, abnormal gait pattern, evidence for imbalance, deviates 10-15 in outside 12 in walkway width. Requires more than 9 sec to ambulate 20 ft.  13.9 secs   Ambulating Backwards Walks 20 ft, uses assistive device, slower speed, mild gait deviations, deviates 6-10 in outside 12 in walkway width.  12.35 secs   Steps Alternating feet, must use rail.   Total Score 17  [facility] Adult PT Treatment/Exercise - 06/29/15 0945    Ambulation/Gait   Ambulation/Gait Yes   Ambulation/Gait Assistance 5: Supervision   Ambulation/Gait Assistance Details 240   Assistive device --  none   Gait Pattern Wide base of support;Decreased step length - right;Decreased step length - left   Ambulation Surface Level;Indoor             Balance Exercises - 06/29/15 1343    Balance Exercises: Standing   Standing Eyes Closed Wide (BOA);Foam/compliant surface;2 reps;10 secs   Partial Tandem Stance Eyes open;1 rep;20 secs   Other Standing Exercises alternate stepping up and back x 10 reps with cues to weight shift anteriorly; stepping laterally 10 reps with cues to weight shift     Instructed in balance      PT  Education - 06/29/15 1348    Education Details Balance HEP   Person(s) Educated Patient   Methods Explanation;Demonstration;Handout   Comprehension Verbalized understanding;Returned demonstration             PT Long Term Goals - 06/26/15 1046    PT LONG TERM GOAL #1   Title Pt. will improve Berg balance test score to >/= 48/56 to decr. fall risk  (target date 07-26-15)   Baseline 43/56   Time 4   Period Weeks   Status New   PT LONG TERM GOAL #2   Title Pt. will negotiate steps using a step over step sequence with use of 1 hand rail to demo improved balance.   (07-26-15)   Baseline 2 rails required   Time 4   Period Weeks   Status New   PT LONG TERM GOAL #3   Title Improve Functional gait test score by at least 3 points to demo improved balance with amb.  (07-26-15)   Baseline TBA   Time 4   Period Weeks   Status New   PT LONG TERM GOAL #4   Title Improve FOTO by at least 10 points for improved pt satisfaction  (07-26-15)   Baseline To be completed   Time 4   Period Weeks   Status New   PT LONG TERM GOAL #5   Title Independent in HEP for balance exercises   (07-26-15)   Time 4   Period Weeks   Status New               Plan - 06/29/15 1350    Clinical Impression Statement Pt. had tremors in bil. legs when standing on compliant surface and with challenging balance activities; pt.'s single limb stance on LLE is not as good as on RLE; some slight ataxia noted in bil. LE's with coordination activities   Pt will benefit from skilled therapeutic intervention in order to improve on the following deficits Abnormal gait;Decreased balance;Decreased mobility;Decreased strength;Decreased activity tolerance   Rehab Potential Good   PT Frequency 2x / week   PT Duration 4 weeks   PT Treatment/Interventions ADLs/Self Care Home Management;Functional mobility training;Stair training;Gait training;Therapeutic activities;Therapeutic exercise;Balance training;Neuromuscular  re-education;Patient/family education   PT Next Visit Plan check HEP ; give fall prevention handout; cont with balance and gait training   PT Home Exercise Plan balance   Consulted and Agree with Plan of Care Patient        Problem List Patient Active Problem List   Diagnosis Date Noted  . Chest pain   . SOB (shortness of breath)   . Hyperlipidemia   . PAF (paroxysmal atrial fibrillation)   . Varicose veins   .  Decreased diffusion capacity     Nilam Quakenbush, Donavan Burnet, PT 06/29/2015, 1:57 PM  [facility] [address] [place], [place], [address] Phone: [phone]   Fax:  [phone]

## 2015-06-29 NOTE — Patient Instructions (Signed)
Tandem Stance   Right foot in front of left, heel touching toe both feet "straight ahead". Stand on Foot Triangle of Support with both feet. Balance in this position _30__ seconds. Do with left foot in front of right.  Copyright  VHI. All rights reserved.  SINGLE LIMB STANCE   Stance: single leg on floor. Raise leg. Hold _5-10__ seconds. Repeat with other leg. __2_ reps per set, _2__ sets per day, 5___ days per week  Copyright  VHI. All rights reserved.  FUNCTIONAL MOBILITY: Marching - Standing   March in place by lifting left leg up, then right. Alternate. _10__ reps per set, _2__ sets per day, _5__ days per week Hold onto a support.  Copyright  VHI. All rights reserved.  Hip Abduction (Standing)   Stand with support. Squeeze pelvic floor and hold. Lift right leg out to side, keeping toe forward. Hold for 2___ seconds. Relax for _3__ seconds.  Repeat _10__ times. Do _2__ times a day. Repeat with other leg.   Copyright  VHI. All rights reserved.  Flexion and Extension - Standing   Stand and support self while swinging uninvolved leg and hip forward and backward _10__ times. Repeat with involved leg and hip. Do 2___ times per day.  Copyright  VHI. All rights reserved.

## 2015-06-30 ENCOUNTER — Encounter: Payer: Self-pay | Admitting: Physical Therapy

## 2015-07-02 ENCOUNTER — Ambulatory Visit: Payer: Medicare Other | Admitting: Physical Therapy

## 2015-07-02 ENCOUNTER — Encounter: Payer: Self-pay | Admitting: Physical Therapy

## 2015-07-02 DIAGNOSIS — R269 Unspecified abnormalities of gait and mobility: Secondary | ICD-10-CM | POA: Diagnosis not present

## 2015-07-02 DIAGNOSIS — R279 Unspecified lack of coordination: Secondary | ICD-10-CM

## 2015-07-02 NOTE — Patient Instructions (Signed)
Crossovers   Move to side: 1) cross right leg in front, then 2) bring back leg out to side. Repeat sequence in same direction. Reverse sequence, moving in opposite direction. Repeat sequence _10___ times per session. Do _1-2___ sessions per day. Do along counter at home. .Also do stepping behind along counter at home - 3 reps  1-2x/day  Copyright  VHI. All rights reserved.

## 2015-07-02 NOTE — Therapy (Signed)
[facility] [address] [place], [place], [address] Phone: [phone]   Fax:  [phone]  Physical Therapy Treatment  Patient Details  Name: Cheryl Peterson MRN: [id number] Date of Birth: 1930/06/27 Referring Provider:  Martha Clan, MD  Encounter Date: 07/02/2015      PT End of Session - 07/02/15 2120    Visit Number 3  G3   Number of Visits 9   Date for PT Re-Evaluation 07/26/15   Authorization Type [employer] Medicare   Authorization Time Period 06-25-15 - 08-24-15   PT Start Time 0935   PT Stop Time 1018   PT Time Calculation (min) 43 min      Past Medical History  Diagnosis Date  . Chest pain   . SOB (shortness of breath)   . Decreased diffusion capacity   . Hypertension   . Hyperlipidemia     Past Surgical History  Procedure Laterality Date  . Tonsillectomy  1939  . Vesicovaginal fistula closure w/ tah  10/28/84    There were no vitals filed for this visit.  Visit Diagnosis:  Abnormality of gait  Lack of coordination      Subjective Assessment - 07/02/15 2112    Subjective Pt. reports she has been doing exercises at home but is unable to completely let goof counter top due to lack of balance   Pertinent History Paroxysmal atrial fibrillation, arthritis   Patient Stated Goals Improve balance   Currently in Pain? No/denies                         [facility] Adult PT Treatment/Exercise - 07/02/15 0001    Transfers   Transfers Sit to Stand   Sit to Stand 4: Min guard  standing on green compliant oval foam   Number of Reps --  5 reps             Balance Exercises - 07/02/15 2113    Balance Exercises: Standing   Standing Eyes Opened Foam/compliant surface;Head turns;5 reps;Wide (BOA)   Standing Eyes Closed Wide (BOA);Solid surface;3 reps;10 secs;Foam/compliant surface  with SBA on floor; with min to mod assist on foam   Stepping Strategy Lateral;5 reps  with CGA   Sidestepping 2 reps  along counter   Cone Rotation Solid surface;R/L  some mild LOB with turns   Other Standing Exercises pt performed amb. on heels 15' x 2 reps and amb. on tip toes 15' x 2 reps;     Pt. Performed cone taps on floor - and then performed tipping cone over and standing back upright with min assist with  LOB:  Figure 8's around 4 cones with min assist due to LOB x1 with turn  Rockerboard x 1' aneriorly/posteriorly with UE assist prn inside bars Marching in place with horizontal head turns with min to mod assist (on floor) Pt. Also performed stepping over/back of orange hurdle x 10 reps with RLE and 10 reps with LLE for improved single  Limb stance - with mod hand held assist     PT Education - 07/02/15 2117    Education provided Yes   Education Details added crossovers in front and stepping behind to HEP- 6' x 3 reps each along countertop at home 1-2x/day   Person(s) Educated Patient   Methods Explanation;Demonstration;Handout   Comprehension Verbalized understanding;Returned demonstration             PT Long Term Goals - 06/26/15 1046  PT LONG TERM GOAL #1   Title Pt. will improve Berg balance test score to >/= 48/56 to decr. fall risk  (target date 07-26-15)   Baseline 43/56   Time 4   Period Weeks   Status New   PT LONG TERM GOAL #2   Title Pt. will negotiate steps using a step over step sequence with use of 1 hand rail to demo improved balance.   (07-26-15)   Baseline 2 rails required   Time 4   Period Weeks   Status New   PT LONG TERM GOAL #3   Title Improve Functional gait test score by at least 3 points to demo improved balance with amb.  (07-26-15)   Baseline TBA   Time 4   Period Weeks   Status New   PT LONG TERM GOAL #4   Title Improve FOTO by at least 10 points for improved pt satisfaction  (07-26-15)   Baseline To be completed   Time 4   Period Weeks   Status New   PT LONG TERM GOAL #5   Title Independent in HEP for balance exercises    (07-26-15)   Time 4   Period Weeks   Status New               Plan - 07/02/15 2121    Clinical Impression Statement Pt. has decreased high level balance skills, i.e. decr. single limb stance with more difficulty with SLS on RLE than on LLE; pt deomonstrating improvement with tandem stance but cont to have difficulty maintaining balance on compliant surface   Pt will benefit from skilled therapeutic intervention in order to improve on the following deficits Abnormal gait;Decreased balance;Decreased mobility;Decreased strength;Decreased activity tolerance   Rehab Potential Good   PT Frequency 2x / week   PT Duration 4 weeks   PT Treatment/Interventions ADLs/Self Care Home Management;Functional mobility training;Stair training;Gait training;Therapeutic activities;Therapeutic exercise;Balance training;Neuromuscular re-education;Patient/family education   PT Next Visit Plan Cont with dynamic gait training and high level balance training; check new ex added to HEP - crossovers and stepping behind - for any problems encountered at home   PT Home Exercise Plan balance   Consulted and Agree with Plan of Care Patient        Problem List Patient Active Problem List   Diagnosis Date Noted  . Chest pain   . SOB (shortness of breath)   . Hyperlipidemia   . PAF (paroxysmal atrial fibrillation)   . Varicose veins   . Decreased diffusion capacity     Advika Mclelland, Donavan Burnet, PT 07/02/2015, 9:26 PM  [facility] [address] [place], [place], [address] Phone: [phone]   Fax:  [phone]

## 2015-07-06 ENCOUNTER — Encounter: Payer: Self-pay | Admitting: Physical Therapy

## 2015-07-06 ENCOUNTER — Ambulatory Visit: Payer: Medicare Other | Admitting: Physical Therapy

## 2015-07-06 DIAGNOSIS — R279 Unspecified lack of coordination: Secondary | ICD-10-CM

## 2015-07-06 DIAGNOSIS — R269 Unspecified abnormalities of gait and mobility: Secondary | ICD-10-CM

## 2015-07-06 NOTE — Therapy (Signed)
[facility] [address] [place], [place], [address] Phone: [phone]   Fax:  [phone]  Physical Therapy Treatment  Patient Details  Name: Cheryl Peterson MRN: [id number] Date of Birth: 10/04/1930 Referring Provider:  Martha Clan, MD  Encounter Date: 07/06/2015      PT End of Session - 07/06/15 1526    PT Start Time 1315   PT Stop Time 1400   PT Time Calculation (min) 45 min   Activity Tolerance Patient tolerated treatment well   Behavior During Therapy [facility] for tasks assessed/performed      Past Medical History  Diagnosis Date  . Chest pain   . SOB (shortness of breath)   . Decreased diffusion capacity   . Hypertension   . Hyperlipidemia     Past Surgical History  Procedure Laterality Date  . Tonsillectomy  1939  . Vesicovaginal fistula closure w/ tah  10/28/84    There were no vitals filed for this visit.  Visit Diagnosis:  Abnormality of gait  Lack of coordination      Subjective Assessment - 07/06/15 1319    Subjective Cheryl Peterson stated she has been working on her HEP; stated she hasn't seen a big improvment in daily routine as of yet but she can do the ex's a little better; like tandem stance and single leg stance   Currently in Pain? No/denies                         [facility] Adult PT Treatment/Exercise - 07/06/15 0001    High Level Balance   High Level Balance Activities Head turns  Walking 6 laps; turning head side to side read large cards   Neuro Re-ed    Neuro Re-ed Details  --  Step reflex; multi bouts of fours step square test; step ove      Neuro ed;  Ankle reflex; standing on Bosu ball; x 5 min;  Eyes open closed; progressively less UE support    Wall kiss (squat) 1' away from wall / no UE support  X5, x 10; progressively further from wall           PT Education - 07/06/15 1524    Education provided Yes   Education Details Instructed on balance reactions  and focus of single leg stance ex's and dynamic balance ex's as well as importance of strength and flexibility training   Person(s) Educated Patient   Methods Explanation             PT Long Term Goals - 06/26/15 1046    PT LONG TERM GOAL #1   Title Pt. will improve Berg balance test score to >/= 48/56 to decr. fall risk  (target date 07-26-15)   Baseline 43/56   Time 4   Period Weeks   Status New   PT LONG TERM GOAL #2   Title Pt. will negotiate steps using a step over step sequence with use of 1 hand rail to demo improved balance.   (07-26-15)   Baseline 2 rails required   Time 4   Period Weeks   Status New   PT LONG TERM GOAL #3   Title Improve Functional gait test score by at least 3 points to demo improved balance with amb.  (07-26-15)   Baseline TBA   Time 4   Period Weeks   Status New   PT LONG TERM GOAL #4   Title Improve FOTO by  at least 10 points for improved pt satisfaction  (07-26-15)   Baseline To be completed   Time 4   Period Weeks   Status New   PT LONG TERM GOAL #5   Title Independent in HEP for balance exercises   (07-26-15)   Time 4   Period Weeks   Status New               Plan - 07/06/15 1527    Clinical Impression Statement Pt tolerated the increased difficulty balance e'xs well; she had several loss of balance that she recovered from indpendently;  she scored  14 seconds on average with FSST putting her at low to moderated fall risk; demod poor ankle straegy on the bosu ball  but she did better as we progressed; expect her to continue to make gains and be a low fall risk by DC        Problem List Patient Active Problem List   Diagnosis Date Noted  . Chest pain   . SOB (shortness of breath)   . Hyperlipidemia   . PAF (paroxysmal atrial fibrillation)   . Varicose veins   . Decreased diffusion capacity     [name]  PT, DPT 07/06/2015, 3:31 PM  [facility] [address]  [address] [place], [place], [address] Phone: [phone]   Fax:  [phone]

## 2015-07-09 ENCOUNTER — Ambulatory Visit: Payer: Medicare Other | Admitting: Physical Therapy

## 2015-07-09 DIAGNOSIS — R269 Unspecified abnormalities of gait and mobility: Secondary | ICD-10-CM | POA: Diagnosis not present

## 2015-07-09 DIAGNOSIS — R279 Unspecified lack of coordination: Secondary | ICD-10-CM

## 2015-07-09 NOTE — Patient Instructions (Signed)
Gathering the [employer] in [place] the [place] position. Shift weight onto one foot. Turn pelvis, torso, other leg outward 20-45 pivoting on heel, while arms open expressively. Keep body upright and low back relaxed. Maintain knee alignment over weighted foot. Repeat ___ times each side.  Copyright  VHI. All rights reserved.  Gathering the [employer] in [place] the [place] position. Shift weight onto one foot. Turn pelvis, torso, other leg outward 20-45 pivoting on heel, while arms open expressively. Keep body upright and low back relaxed. Maintain knee alignment over weighted foot. Repeat __10_ times each side-Go as slow as possible  Copyright  VHI. All rights reserved.  Gathering the [employer] in [place] the [place] position. Shift weight onto one foot. Turn pelvis, torso, other leg outward 20-45 pivoting on heel, while arms open expressively. Keep body upright and low back relaxed. Maintain knee alignment over weighted foot. Repeat _5__ times each side.  Copyright  VHI. All rights reserved.  Flying [facility] with toes pointing out 20-45, arms relaxed at sides. Bend knee while shifting weight 100% onto one foot. Lift knee of unweighted foot to front while raising arms to shoulder height at sides, leading with wrists, fingers touching thumbs. Return to start position while lowering arms, leading with wrists. Repeat _5__ times each side.  Copyright  VHI. All rights reserved.  Flying [facility] with toes pointing out 20-45, arms relaxed at sides. Bend knee while shifting weight 100% onto one foot. Lift knee of unweighted foot to front while raising arms to shoulder height at sides, leading with wrists, fingers touching thumbs. Return to start position while lowering arms, leading with wrists. Repeat ___ times each side.  Copyright  VHI. All rights reserved.  Dancing [facility] with toes pointing out 20-45. Arms in Holding the [place] position. Bend knee while  shifting weight 100% onto one foot. Lift unweighted leg to diagonal. Simultaneously open arms expressively. Repeat 5___ times each side.  Copyright  VHI. All rights reserved.  Dancing [facility] with toes pointing out 20-45. Arms in Holding the [place] position. Bend knee while shifting weight 100% onto one foot. Lift unweighted leg to diagonal. Simultaneously open arms expressively. Repeat ___ times each side.  Copyright  VHI. All rights reserved.

## 2015-07-09 NOTE — Therapy (Signed)
[facility] [address] [place], [place], [address] Phone: [phone]   Fax:  [phone]  Physical Therapy Treatment  Patient Details  Name: Cheryl Peterson MRN: [id number] Date of Birth: May 16, 1930 Referring Provider:  Marton Redwood, MD  Encounter Date: 07/09/2015      PT End of Session - 07/09/15 1401    Visit Number 5   Number of Visits 9   PT Start Time 0938   PT Stop Time 1400   PT Time Calculation (min) 45 min   Equipment Utilized During Treatment Gait belt      Past Medical History  Diagnosis Date  . Chest pain   . SOB (shortness of breath)   . Decreased diffusion capacity   . Hypertension   . Hyperlipidemia     Past Surgical History  Procedure Laterality Date  . Tonsillectomy  1939  . Vesicovaginal fistula closure w/ tah  10/28/84    There were no vitals filed for this visit.  Visit Diagnosis:  Abnormality of gait  Lack of coordination      Subjective Assessment - 07/09/15 1324    Subjective No new complaints; Stated she has been working on single leg stance (tree pose) and tandem standing;  she had been standing on her couch for a compliant surface- i recommended and asked she please not do that for saftey sake.   Currently in Pain? No/denies      Neuromusclar re-ed Tai chi ex's  ( see patient instruction section for full details); Single leg stance weight shifting; multi bouts;                             PT Education - 07/09/15 1400    Education provided Yes   Education Details 3 tai chi balance ex's   Person(s) Educated Patient   Methods Explanation   Comprehension Returned demonstration;Need further instruction             PT Long Term Goals - 06/26/15 1046    PT LONG TERM GOAL #1   Title Pt. will improve Berg balance test score to >/= 48/56 to decr. fall risk  (target date 07-26-15)   Baseline 43/56   Time 4   Period Weeks   Status New   PT  LONG TERM GOAL #2   Title Pt. will negotiate steps using a step over step sequence with use of 1 hand rail to demo improved balance.   (07-26-15)   Baseline 2 rails required   Time 4   Period Weeks   Status New   PT LONG TERM GOAL #3   Title Improve Functional gait test score by at least 3 points to demo improved balance with amb.  (07-26-15)   Baseline TBA   Time 4   Period Weeks   Status New   PT LONG TERM GOAL #4   Title Improve FOTO by at least 10 points for improved pt satisfaction  (07-26-15)   Baseline To be completed   Time 4   Period Weeks   Status New   PT LONG TERM GOAL #5   Title Independent in HEP for balance exercises   (07-26-15)   Time 4   Period Weeks   Status New               Plan - 07/09/15 1402    Clinical Impression Statement Pt liked the tai chi ex's ; they are  a maximal effort for her; she was sweating  a lot with the ex's;  she has poor control of single leg stance and weight shifting;  as she improves expect her continue to lower fall risk        Problem List Patient Active Problem List   Diagnosis Date Noted  . Chest pain   . SOB (shortness of breath)   . Hyperlipidemia   . PAF (paroxysmal atrial fibrillation)   . Varicose veins   . Decreased diffusion capacity     Jana Hakim PT DPT 07/09/2015, 3:03 PM  [facility] [address] [place], [place], [address] Phone: [phone]   Fax:  [phone]

## 2015-07-13 ENCOUNTER — Ambulatory Visit: Payer: Medicare Other | Admitting: Physical Therapy

## 2015-07-13 DIAGNOSIS — R269 Unspecified abnormalities of gait and mobility: Secondary | ICD-10-CM | POA: Diagnosis not present

## 2015-07-13 DIAGNOSIS — R279 Unspecified lack of coordination: Secondary | ICD-10-CM

## 2015-07-14 ENCOUNTER — Encounter: Payer: Self-pay | Admitting: Physical Therapy

## 2015-07-14 NOTE — Therapy (Signed)
[facility] [address] [place], [place], [address] Phone: [phone]   Fax:  [phone]  Physical Therapy Treatment  Patient Details  Name: Cheryl Peterson MRN: [id number] Date of Birth: 09-16-1930 Referring Provider:  Marton Redwood, MD  Encounter Date: 07/13/2015      PT End of Session - 07/14/15 2140    Visit Number 6   Number of Visits 9   Date for PT Re-Evaluation 07/26/15   Authorization Type U[employer] Medicare   Authorization Time Period 06-25-15 - 08-24-15   PT Start Time 1316   PT Stop Time 1403   PT Time Calculation (min) 47 min   Equipment Utilized During Treatment Gait belt      Past Medical History  Diagnosis Date  . Chest pain   . SOB (shortness of breath)   . Decreased diffusion capacity   . Hypertension   . Hyperlipidemia     Past Surgical History  Procedure Laterality Date  . Tonsillectomy  1939  . Vesicovaginal fistula closure w/ tah  10/28/84    There were no vitals filed for this visit.  Visit Diagnosis:  Abnormality of gait  Lack of coordination      Subjective Assessment - 07/14/15 2135    Subjective Pt. states that she felt that the Tai Chi exercises were too difficult for her "I can't do them"; states balance is main problem but needs easier exercises   Pertinent History Paroxysmal atrial fibrillation, arthritis   Patient Stated Goals Improve balance   Currently in Pain? No/denies                         [facility] Adult PT Treatment/Exercise - 07/14/15 0001    Transfers   Transfers Sit to Stand   Sit to Stand 5: Supervision  on compliant surface 5 reps without UE support   Lumbar Exercises: Aerobic   Stationary Bike 5" on Nustep level 4             Balance Exercises - 07/14/15 2137    Balance Exercises: Standing   SLS Eyes open;2 reps   Rockerboard Anterior/posterior;Lateral;EO;30 seconds;Intermittent UE support   Sidestepping 2 reps  inside bars   Other Standing Exercises crossovers and backward stepping inside bars; stepping over orange hurdles; tapping balance bubbles for coordination with UE support at counter prn                                                       PT Long Term Goals - 06/26/15 1046    PT LONG TERM GOAL #1   Title Pt. will improve Berg balance test score to >/= 48/56 to decr. fall risk  (target date 07-26-15)   Baseline 43/56   Time 4   Period Weeks   Status New   PT LONG TERM GOAL #2   Title Pt. will negotiate steps using a step over step sequence with use of 1 hand rail to demo improved balance.   (07-26-15)   Baseline 2 rails required   Time 4   Period Weeks   Status New   PT LONG TERM GOAL #3   Title Improve Functional gait test score by at least 3 points to demo improved balance with amb.  (07-26-15)   Baseline TBA  Time 4   Period Weeks   Status New   PT LONG TERM GOAL #4   Title Improve FOTO by at least 10 points for improved pt satisfaction  (07-26-15)   Baseline To be completed   Time 4   Period Weeks   Status New   PT LONG TERM GOAL #5   Title Independent in HEP for balance exercises   (07-26-15)   Time 4   Period Weeks   Status New               Plan - 07/14/15 2141    Clinical Impression Statement Pt. has decr. single limb stance iwth LLE more involved than RLE; some decr. coordination noted with RLE with touching targets   Pt will benefit from skilled therapeutic intervention in order to improve on the following deficits Abnormal gait;Decreased balance;Decreased mobility;Decreased strength;Decreased activity tolerance   Rehab Potential Good   PT Frequency 2x / week   PT Duration 4 weeks   PT Treatment/Interventions ADLs/Self Care Home Management;Functional mobility training;Stair training;Gait training;Therapeutic activities;Therapeutic exercise;Balance training;Neuromuscular re-education;Patient/family education   PT Next Visit Plan single limb stance exs   PT Home  Exercise Plan balance   Consulted and Agree with Plan of Care Patient        Problem List Patient Active Problem List   Diagnosis Date Noted  . Chest pain   . SOB (shortness of breath)   . Hyperlipidemia   . PAF (paroxysmal atrial fibrillation)   . Varicose veins   . Decreased diffusion capacity     Alda Lea, PT 07/14/2015, 9:44 PM  [facility] [address] [place], [place], [address] Phone: [phone]   Fax:  [phone]

## 2015-07-16 ENCOUNTER — Ambulatory Visit: Payer: Medicare Other | Admitting: Physical Therapy

## 2015-07-16 DIAGNOSIS — R269 Unspecified abnormalities of gait and mobility: Secondary | ICD-10-CM

## 2015-07-16 DIAGNOSIS — R279 Unspecified lack of coordination: Secondary | ICD-10-CM

## 2015-07-17 ENCOUNTER — Encounter: Payer: Self-pay | Admitting: Physical Therapy

## 2015-07-17 NOTE — Therapy (Signed)
[facility] [address] [place], [place], [address] Phone: [phone]   Fax:  [phone]  Physical Therapy Treatment  Patient Details  Name: Cheryl Peterson MRN: [id number] Date of Birth: July 23, 1930 Referring Provider:  Martha Clan, MD  Encounter Date: 07/16/2015      PT End of Session - 07/17/15 1710    Visit Number 7   Number of Visits 9   Date for PT Re-Evaluation 07/26/15   Authorization Type UHC Medicare   Authorization Time Period 06-25-15 - 08-24-15   PT Start Time 1449   PT Stop Time 1530   PT Time Calculation (min) 41 min      Past Medical History  Diagnosis Date  . Chest pain   . SOB (shortness of breath)   . Decreased diffusion capacity   . Hypertension   . Hyperlipidemia     Past Surgical History  Procedure Laterality Date  . Tonsillectomy  1939  . Vesicovaginal fistula closure w/ tah  10/28/84    There were no vitals filed for this visit.  Visit Diagnosis:  Abnormality of gait  Lack of coordination      Subjective Assessment - 07/17/15 1706    Subjective Pt. reports feeling that balance exercises are helping "I'm doing better"   Pertinent History Paroxysmal atrial fibrillation, arthritis   Patient Stated Goals Improve balance   Currently in Pain? No/denies                         [facility] Adult PT Treatment/Exercise - 07/17/15 0001    Transfers   Transfers Sit to Stand   Sit to Stand 5: Supervision  on compliant surface 5 reps without UE support   Ambulation/Gait   Ambulation/Gait Yes   Ambulation/Gait Assistance 5: Supervision   Ambulation/Gait Assistance Details 125   Gait Pattern Wide base of support;Decreased step length - right;Decreased step length - left   Ambulation Surface Level;Indoor   Stairs Yes   Stair Management Technique No rails   Number of Stairs 4   Height of Stairs 6   High Level Balance   High Level Balance Activities Head turns   Walking 6 laps; turning head side to side read large cards   Neuro Re-ed    Neuro Re-ed Details  single limb stance activities on noncompliant surface with CGA to min assist                                           Lumbar Exercises: Standing   Heel Raises 10 reps     alternate tap ups to 1st, 2nd steps with each leg - 10 reps each; rockerboard 1" anterior/posteriorly with UE support prn  Amb. 120' tossing ball naming foods A-N for multi-tasking with gait with CGA              PT Long Term Goals - 06/26/15 1046    PT LONG TERM GOAL #1   Title Pt. will improve Berg balance test score to >/= 48/56 to decr. fall risk  (target date 07-26-15)   Baseline 43/56   Time 4   Period Weeks   Status New   PT LONG TERM GOAL #2   Title Pt. will negotiate steps using a step over step sequence with use of 1 hand rail to demo improved balance.   (07-26-15)  Baseline 2 rails required   Time 4   Period Weeks   Status New   PT LONG TERM GOAL #3   Title Improve Functional gait test score by at least 3 points to demo improved balance with amb.  (07-26-15)   Baseline TBA   Time 4   Period Weeks   Status New   PT LONG TERM GOAL #4   Title Improve FOTO by at least 10 points for improved pt satisfaction  (07-26-15)   Baseline To be completed   Time 4   Period Weeks   Status New   PT LONG TERM GOAL #5   Title Independent in HEP for balance exercises   (07-26-15)   Time 4   Period Weeks   Status New               Plan - 07/17/15 1711    Clinical Impression Statement Pt demonstrating improved balance as evidenced bu negotiating steps without use of rail today - some unsteadiness noted but pt able to recover and ascend/descend without using rail   Pt will benefit from skilled therapeutic intervention in order to improve on the following deficits Abnormal gait;Decreased balance;Decreased mobility;Decreased strength;Decreased activity tolerance   Rehab Potential Good   PT Frequency 2x /  week   PT Duration 4 weeks   PT Treatment/Interventions ADLs/Self Care Home Management;Functional mobility training;Stair training;Gait training;Therapeutic activities;Therapeutic exercise;Balance training;Neuromuscular re-education;Patient/family education   PT Next Visit Plan single limb stance exs   PT Home Exercise Plan balance   Consulted and Agree with Plan of Care Patient        Problem List Patient Active Problem List   Diagnosis Date Noted  . Chest pain   . SOB (shortness of breath)   . Hyperlipidemia   . PAF (paroxysmal atrial fibrillation)   . Varicose veins   . Decreased diffusion capacity     Cheryl Peterson, PT 07/17/2015, 5:14 PM  [facility] [address] [place], [place], [address] Phone: [phone]   Fax:  [phone]

## 2015-07-20 ENCOUNTER — Ambulatory Visit: Payer: Medicare Other | Admitting: Physical Therapy

## 2015-07-20 ENCOUNTER — Encounter: Payer: Self-pay | Admitting: Physical Therapy

## 2015-07-20 DIAGNOSIS — R269 Unspecified abnormalities of gait and mobility: Secondary | ICD-10-CM

## 2015-07-20 DIAGNOSIS — R279 Unspecified lack of coordination: Secondary | ICD-10-CM

## 2015-07-20 NOTE — Therapy (Signed)
[facility] [address] [place], [place], [address] Phone: [phone]   Fax:  [phone]  Physical Therapy Treatment  Patient Details  Name: Cheryl Peterson MRN: [id number] Date of Birth: Jul 21, 1930 Referring Provider:  Martha Clan, MD  Encounter Date: 07/20/2015      PT End of Session - 07/20/15 1953    Visit Number 8   Number of Visits 9   Date for PT Re-Evaluation 07/26/15   Authorization Type UHC Medicare   Authorization Time Period 06-25-15 - 08-24-15   PT Start Time 1320   PT Stop Time 1405   PT Time Calculation (min) 45 min      Past Medical History  Diagnosis Date  . Chest pain   . SOB (shortness of breath)   . Decreased diffusion capacity   . Hypertension   . Hyperlipidemia     Past Surgical History  Procedure Laterality Date  . Tonsillectomy  1939  . Vesicovaginal fistula closure w/ tah  10/28/84    There were no vitals filed for this visit.  Visit Diagnosis:  Abnormality of gait  Lack of coordination      Subjective Assessment - 07/20/15 1948    Subjective Pt. reports doing better - "I think another 4 weeks will help"   Pertinent History Paroxysmal atrial fibrillation, arthritis   Patient Stated Goals Improve balance   Currently in Pain? No/denies                         [facility] Adult PT Treatment/Exercise - 07/20/15 0001    Ambulation/Gait   Ambulation/Gait Yes   Ambulation/Gait Assistance 5: Supervision   Ambulation Distance (Feet) 125 Feet   Assistive device None   Gait Pattern Wide base of support;Decreased step length - right;Decreased step length - left   Ambulation Surface Level;Indoor   Stairs Yes   Stairs Assistance 5: Supervision   Stair Management Technique No rails   Number of Stairs 4   Height of Stairs 6   Neuro Re-ed    Neuro Re-ed Details  single limb stance activities on noncompliant surface with CGA to min assist                                            Lumbar Exercises: Aerobic   Stationary Bike 5" on Nustep level 4   Lumbar Exercises: Standing   Heel Raises 10 reps             Balance Exercises - 07/20/15 1952    Balance Exercises: Standing   Sidestepping 2 reps  crossovers with CGA     Marching with head turns 10 reps with CGA: standing on BOSU with moving each leg up/back and out and in 10 reps each; Stepping over and back of bolster with UE support with CGA Crossover steps 5 reps each leg inside bars with CGA Alternate stepping on incline and decline 5 reps with min assist for improved single limb stance          PT Long Term Goals - 06/26/15 1046    PT LONG TERM GOAL #1   Title Pt. will improve Berg balance test score to >/= 48/56 to decr. fall risk  (target date 07-26-15)   Baseline 43/56   Time 4   Period Weeks   Status New   PT  LONG TERM GOAL #2   Title Pt. will negotiate steps using a step over step sequence with use of 1 hand rail to demo improved balance.   (07-26-15)   Baseline 2 rails required   Time 4   Period Weeks   Status New   PT LONG TERM GOAL #3   Title Improve Functional gait test score by at least 3 points to demo improved balance with amb.  (07-26-15)   Baseline TBA   Time 4   Period Weeks   Status New   PT LONG TERM GOAL #4   Title Improve FOTO by at least 10 points for improved pt satisfaction  (07-26-15)   Baseline To be completed   Time 4   Period Weeks   Status New   PT LONG TERM GOAL #5   Title Independent in HEP for balance exercises   (07-26-15)   Time 4   Period Weeks   Status New               Plan - 07/20/15 1953    Clinical Impression Statement Pt. cont to have single limb stance deficits with RLE weaker than LLE and less SLS on RLE than on LLE; improving with balance overall   Pt will benefit from skilled therapeutic intervention in order to improve on the following deficits Abnormal gait;Decreased balance;Decreased mobility;Decreased  strength;Decreased activity tolerance   Rehab Potential Good   PT Frequency 2x / week   PT Duration 4 weeks   PT Treatment/Interventions ADLs/Self Care Home Management;Functional mobility training;Stair training;Gait training;Therapeutic activities;Therapeutic exercise;Balance training;Neuromuscular re-education;Patient/family education   PT Next Visit Plan check STG's and renew   PT Home Exercise Plan balance   Consulted and Agree with Plan of Care Patient        Problem List Patient Active Problem List   Diagnosis Date Noted  . Chest pain   . SOB (shortness of breath)   . Hyperlipidemia   . PAF (paroxysmal atrial fibrillation)   . Varicose veins   . Decreased diffusion capacity     Kary Kos, PT 07/20/2015, 7:57 PM  [facility] [address] [place], [place], [address] Phone: [phone]   Fax:  [phone]

## 2015-07-23 ENCOUNTER — Ambulatory Visit: Payer: Medicare Other | Admitting: Physical Therapy

## 2015-07-23 DIAGNOSIS — R269 Unspecified abnormalities of gait and mobility: Secondary | ICD-10-CM

## 2015-07-23 DIAGNOSIS — R279 Unspecified lack of coordination: Secondary | ICD-10-CM

## 2015-07-25 NOTE — Therapy (Signed)
[facility] [address] [place], [place], [address] Phone: [phone]   Fax:  [phone]  Physical Therapy Treatment  Patient Details  Name: Cheryl Peterson MRN: [id number] Date of Birth: 1930/05/08 Referring Provider:  Marton Redwood, MD  Encounter Date: 07/23/2015      PT End of Session - 07/25/15 1408    Visit Number 9  G1   Number of Visits 9   Date for PT Re-Evaluation 08/23/15   Authorization Type UHC Medicare   Authorization Time Period 06-25-15 - 08-24-15   PT Start Time 1404   PT Stop Time 1450   PT Time Calculation (min) 46 min      Past Medical History  Diagnosis Date  . Chest pain   . SOB (shortness of breath)   . Decreased diffusion capacity   . Hypertension   . Hyperlipidemia     Past Surgical History  Procedure Laterality Date  . Tonsillectomy  1939  . Vesicovaginal fistula closure w/ tah  10/28/84    There were no vitals filed for this visit.  Visit Diagnosis:  Abnormality of gait - Plan: PT plan of care cert/re-cert  Lack of coordination - Plan: PT plan of care cert/re-cert      Subjective Assessment - 07/25/15 1353    Subjective Pt states that "today is not a good day - I'm very tired today - My [employer] club went to [place] on Wed. for lunch and I was tired when I got home"   Pertinent History Paroxysmal atrial fibrillation, arthritis   Patient Stated Goals Improve balance   Currently in Pain? No/denies                         [facility] Adult PT Treatment/Exercise - 07/25/15 0001    Ambulation/Gait   Stairs Yes   Stairs Assistance 4: Min guard   Stair Management Technique No rails   Number of Stairs 4   Height of Stairs 6   Berg Balance Test   Sit to Stand Able to stand without using hands and stabilize independently   Standing Unsupported Able to stand safely 2 minutes   Sitting with Back Unsupported but Feet Supported on Floor or Stool Able to sit safely and  securely 2 minutes   Stand to Sit Sits safely with minimal use of hands   Transfers Able to transfer safely, minor use of hands   Standing Unsupported with Eyes Closed Able to stand 10 seconds with supervision   Standing Ubsupported with Feet Together Able to place feet together independently and stand for 1 minute with supervision   From Standing, Reach Forward with Outstretched Arm Can reach confidently >25 cm (10")   From Standing Position, Pick up Object from Floor Able to pick up shoe safely and easily   From Standing Position, Turn to Look Behind Over each Shoulder Looks behind from both sides and weight shifts well   Turn 360 Degrees Able to turn 360 degrees safely in 4 seconds or less   Standing Unsupported, Alternately Place Feet on Step/Stool Able to complete >2 steps/needs minimal assist   Standing Unsupported, One Foot in Front Able to plae foot ahead of the other independently and hold 30 seconds   Standing on One Leg Tries to lift leg/unable to hold 3 seconds but remains standing independently   Total Score 47  PT Long Term Goals - 07/25/15 1356    PT LONG TERM GOAL #1   Title Pt. will improve Berg balance test score to >/= 48/56 to decr. fall risk  (target date 07-26-15)   Baseline 47/56   Time 4   Period Weeks   Status On-going   PT LONG TERM GOAL #2   Title Pt. will negotiate steps using a step over step sequence with use of 1 hand rail to demo improved balance.   (07-26-15)   Status Achieved   PT LONG TERM GOAL #3   Title Improve Functional gait test score by at least 3 points to demo improved balance with amb.  (07-26-15)   Status On-going   PT LONG TERM GOAL #4   Title Improve FOTO by at least 10 points for improved pt satisfaction  (07-26-15)   Status On-going   PT LONG TERM GOAL #5   Title Independent in HEP for balance exercises   (07-26-15)   Status Achieved   Additional Long Term Goals   Additional Long Term Goals Yes   PT LONG  TERM GOAL #6   Title Berg score >/= 50/56 for decr. fall risk  (08-23-15)   Baseline 47/56   Time 4   Period Weeks   Status New   PT LONG TERM GOAL #7   Title Negotiate steps without rail using a step by step sequence  (08-23-15)   Time 4   Period Weeks   Status New   PT LONG TERM GOAL #8   Title Floor to stand transfer with S using UE support on chair/mat   (08-23-15)   Time 4   Period Weeks   Status New               Plan - 07/25/15 1411    Clinical Impression Statement Pt. met LTG# 5 with LTG #1 closely approximated with Merrilee Jansky score 47/56 (goal 48/56): pt improving with gait and balance    Pt will benefit from skilled therapeutic intervention in order to improve on the following deficits Abnormal gait;Decreased balance;Decreased mobility;Decreased strength;Decreased activity tolerance   Rehab Potential Good   PT Frequency 2x / week   PT Duration 4 weeks   PT Treatment/Interventions ADLs/Self Care Home Management;Functional mobility training;Stair training;Gait training;Therapeutic activities;Therapeutic exercise;Balance training;Neuromuscular re-education;Patient/family education   PT Next Visit Plan cont balance and gait training   PT Home Exercise Plan balance   Consulted and Agree with Plan of Care Patient        Problem List Patient Active Problem List   Diagnosis Date Noted  . Chest pain   . SOB (shortness of breath)   . Hyperlipidemia   . PAF (paroxysmal atrial fibrillation)   . Varicose veins   . Decreased diffusion capacity   Physical Therapy Progress Note  Dates of Reporting Period: 06-25-15 to 07-23-15  Objective Reports of Subjective Statement: Berg score 47/56;  TUG score 9.69 secs with no device  Objective Measurements:  See above - pt feels that her balance is slowly improving and gait is improving without use of assistive device  Goal Update: LTG# 1 closely approximated; LTG #2 met; LTG #3-4 ongoing as renewal is being completed with these goals  continuing (see above for status towards goals) Plan: Continued gait and balance training with HEP for balance exercises  Reason Skilled Services are Required: Decr. Coordination, decr. Balance and decr. Safety with gait   Lorna Dibble 07/25/2015, 2:40 PM  [facility]  [address] [place], [place], [address] Phone: [phone]   Fax:  [phone]

## 2015-07-25 NOTE — Therapy (Signed)
[facility] [address] [place], [place], [address] Phone: [phone]   Fax:  [phone]  Physical Therapy Treatment  Patient Details  Name: Cheryl Peterson MRN: [id number] Date of Birth: 1929/12/31 Referring Provider:  Marton Redwood, MD  Encounter Date: 07/23/2015      PT End of Session - 07/25/15 1408    Visit Number 9  G1   Number of Visits 9   Date for PT Re-Evaluation 08/23/15   Authorization Type [employer] Medicare   Authorization Time Period 06-25-15 - 08-24-15   PT Start Time 1404   PT Stop Time 1450   PT Time Calculation (min) 46 min      Past Medical History  Diagnosis Date  . Chest pain   . SOB (shortness of breath)   . Decreased diffusion capacity   . Hypertension   . Hyperlipidemia     Past Surgical History  Procedure Laterality Date  . Tonsillectomy  1939  . Vesicovaginal fistula closure w/ tah  10/28/84    There were no vitals filed for this visit.  Visit Diagnosis:  Abnormality of gait - Plan: PT plan of care cert/re-cert  Lack of coordination - Plan: PT plan of care cert/re-cert      Subjective Assessment - 07/25/15 1353    Subjective Pt states that "today is not a good day - I'm very tired today - My [employer] club went to [place] on Wed. for lunch and I was tired when I got home"   Pertinent History Paroxysmal atrial fibrillation, arthritis   Patient Stated Goals Improve balance   Currently in Pain? No/denies                         [facility] Adult PT Treatment/Exercise - 07/25/15 0001    Ambulation/Gait   Stairs Yes   Stairs Assistance 4: Min guard   Stair Management Technique No rails   Number of Stairs 4   Height of Stairs 6   Berg Balance Test   Sit to Stand Able to stand without using hands and stabilize independently   Standing Unsupported Able to stand safely 2 minutes   Sitting with Back Unsupported but Feet Supported on Floor or Stool Able to sit safely and  securely 2 minutes   Stand to Sit Sits safely with minimal use of hands   Transfers Able to transfer safely, minor use of hands   Standing Unsupported with Eyes Closed Able to stand 10 seconds with supervision   Standing Ubsupported with Feet Together Able to place feet together independently and stand for 1 minute with supervision   From Standing, Reach Forward with Outstretched Arm Can reach confidently >25 cm (10")   From Standing Position, Pick up Object from Floor Able to pick up shoe safely and easily   From Standing Position, Turn to Look Behind Over each Shoulder Looks behind from both sides and weight shifts well   Turn 360 Degrees Able to turn 360 degrees safely in 4 seconds or less   Standing Unsupported, Alternately Place Feet on Step/Stool Able to complete >2 steps/needs minimal assist   Standing Unsupported, One Foot in Front Able to plae foot ahead of the other independently and hold 30 seconds   Standing on One Leg Tries to lift leg/unable to hold 3 seconds but remains standing independently   Total Score 47     TherEx;  Heel raises 10 reps bil. LE's  Sit to stand 5 reps without UE support on blue foam                Bil. Hip ext. and abduction with green theraband x 10 reps each leg                PT Long Term Goals - 07/25/15 1356    PT LONG TERM GOAL #1   Title Pt. will improve Berg balance test score to >/= 48/56 to decr. fall risk  (target date 07-26-15)   Baseline 47/56   Time 4   Period Weeks   Status On-going   PT LONG TERM GOAL #2   Title Pt. will negotiate steps using a step over step sequence with use of 1 hand rail to demo improved balance.   (07-26-15)   Status Achieved   PT LONG TERM GOAL #3   Title Improve Functional gait test score by at least 3 points to demo improved balance with amb.  (07-26-15)   Status On-going   PT LONG TERM GOAL #4   Title Improve FOTO by at least 10 points for improved pt satisfaction  (07-26-15)   Status  On-going   PT LONG TERM GOAL #5   Title Independent in HEP for balance exercises   (07-26-15)   Status Achieved   Additional Long Term Goals   Additional Long Term Goals Yes   PT LONG TERM GOAL #6   Title Berg score >/= 50/56 for decr. fall risk  (08-23-15)   Baseline 47/56   Time 4   Period Weeks   Status New   PT LONG TERM GOAL #7   Title Negotiate steps without rail using a step by step sequence  (08-23-15)   Time 4   Period Weeks   Status New   PT LONG TERM GOAL #8   Title Floor to stand transfer with S using UE support on chair/mat   (08-23-15)   Time 4   Period Weeks   Status New               Plan - 07/25/15 1411    Clinical Impression Statement Pt. met LTG# 5 with LTG #1 closely approximated with Merrilee Jansky score 47/56 (goal 48/56): pt improving with gait and balance    Pt will benefit from skilled therapeutic intervention in order to improve on the following deficits Abnormal gait;Decreased balance;Decreased mobility;Decreased strength;Decreased activity tolerance   Rehab Potential Good   PT Frequency 2x / week   PT Duration 4 weeks   PT Treatment/Interventions ADLs/Self Care Home Management;Functional mobility training;Stair training;Gait training;Therapeutic activities;Therapeutic exercise;Balance training;Neuromuscular re-education;Patient/family education   PT Next Visit Plan cont balance and gait training   PT Home Exercise Plan balance   Consulted and Agree with Plan of Care Patient        Problem List Patient Active Problem List   Diagnosis Date Noted  . Chest pain   . SOB (shortness of breath)   . Hyperlipidemia   . PAF (paroxysmal atrial fibrillation)   . Varicose veins   . Decreased diffusion capacity     Franklyn Cafaro, [place], PT 07/25/2015, 2:29 PM  [facility] [address] [place], [place], [address] Phone: [phone]   Fax:  [phone]

## 2015-07-28 ENCOUNTER — Ambulatory Visit: Payer: Medicare Other | Admitting: Physical Therapy

## 2015-07-28 DIAGNOSIS — R269 Unspecified abnormalities of gait and mobility: Secondary | ICD-10-CM

## 2015-07-28 DIAGNOSIS — R279 Unspecified lack of coordination: Secondary | ICD-10-CM

## 2015-07-30 ENCOUNTER — Ambulatory Visit: Payer: Medicare Other | Attending: Internal Medicine | Admitting: Physical Therapy

## 2015-07-30 DIAGNOSIS — R269 Unspecified abnormalities of gait and mobility: Secondary | ICD-10-CM | POA: Diagnosis present

## 2015-07-30 DIAGNOSIS — R279 Unspecified lack of coordination: Secondary | ICD-10-CM | POA: Insufficient documentation

## 2015-07-30 NOTE — Patient Instructions (Addendum)
EXTENSION: Standing - Resistance Band (Active)   Stand, both feet flat. Against yellow resistance band, draw right leg behind body as far as possible. Complete __1_ sets of __10_ repetitions. Perform __2_ sessions per day.  http://gtsc.exer.us/83   Copyright  VHI. All rights reserved.  ABDUCTION: Standing - Resistance Band (Active)   Stand, feet flat. Against yellow resistance band, lift right leg out to side. Complete 1___ sets of _10__ repetitions. Perform _2__ sessions per day.  http://gtsc.exer.us/117   Copyright  VHI. All rights reserved.

## 2015-07-31 ENCOUNTER — Encounter: Payer: Self-pay | Admitting: Physical Therapy

## 2015-07-31 NOTE — Therapy (Signed)
[facility] [address] [place], [place], [address] Phone: [phone]   Fax:  [phone]  Physical Therapy Treatment  Patient Details  Name: Cheryl Peterson MRN: [id number] Date of Birth: 01/23/1930 Referring Provider:  Martha Clan, MD  Encounter Date: 07/30/2015      PT End of Session - 07/31/15 2132    Visit Number 11  G2   Number of Visits 17   Date for PT Re-Evaluation 08/23/15   Authorization Type [employer] Medicare   Authorization Time Period 07-23-15 - 09-21-15   PT Start Time 1537   PT Stop Time 1626   PT Time Calculation (min) 49 min   Equipment Utilized During Treatment Gait belt      Past Medical History  Diagnosis Date  . Chest pain   . SOB (shortness of breath)   . Decreased diffusion capacity   . Hypertension   . Hyperlipidemia     Past Surgical History  Procedure Laterality Date  . Tonsillectomy  1939  . Vesicovaginal fistula closure w/ tah  10/28/84    There were no vitals filed for this visit.  Visit Diagnosis:  Abnormality of gait  Lack of coordination      Subjective Assessment - 07/31/15 2128    Subjective "doing better" pt reports no problems and no falls since previous session on Tuesday   Pertinent History Paroxysmal atrial fibrillation, arthritis   Patient Stated Goals Improve balance   Currently in Pain? No/denies                         [facility] Adult PT Treatment/Exercise - 07/31/15 0001    Ambulation/Gait   Stairs Yes   Stairs Assistance 4: Min guard   Stair Management Technique Forwards;Alternating pattern;No rails  use of rail with LOB with descending steps   Number of Stairs 4   Height of Stairs 6             Balance Exercises - 07/31/15 1044    Balance Exercises: Standing   Standing Eyes Opened Foam/compliant surface;Head turns;5 reps;Wide (BOA)   Standing Eyes Closed Wide (BOA);Solid surface;3 reps;10 secs;Foam/compliant surface  with  SBA on floor; with min to mod assist on foam   SLS Eyes open   Rockerboard Anterior/posterior;Lateral;EO   Sidestepping 3 reps  crossovers front added on reps 2 & 3   Other Standing Exercises standing on BOSU - weight shifts 10 times front to back and attempted moving each leg up and back with bil UE support;  alternate tap ups to 6" step 10 reps with each LE                                                  TherEx:  Standing hip extension and abduction with 4# weight x 10 reps each in standing                Heel raises x 10 reps in standing;  Toe raises in standing x 10 reps in standing                Nustep level 4 x 5" with UE's and LE's           PT Long Term Goals - 07/25/15 1356    PT LONG TERM GOAL #1   Title  Pt. will improve Berg balance test score to >/= 48/56 to decr. fall risk  (target date 07-26-15)   Baseline 47/56   Time 4   Period Weeks   Status On-going   PT LONG TERM GOAL #2   Title Pt. will negotiate steps using a step over step sequence with use of 1 hand rail to demo improved balance.   (07-26-15)   Status Achieved   PT LONG TERM GOAL #3   Title Improve Functional gait test score by at least 3 points to demo improved balance with amb.  (07-26-15)   Status On-going   PT LONG TERM GOAL #4   Title Improve FOTO by at least 10 points for improved pt satisfaction  (07-26-15)   Status On-going   PT LONG TERM GOAL #5   Title Independent in HEP for balance exercises   (07-26-15)   Status Achieved   Additional Long Term Goals   Additional Long Term Goals Yes   PT LONG TERM GOAL #6   Title Berg score >/= 50/56 for decr. fall risk  (08-23-15)   Baseline 47/56   Time 4   Period Weeks   Status New   PT LONG TERM GOAL #7   Title Negotiate steps without rail using a step by step sequence  (08-23-15)   Time 4   Period Weeks   Status New   PT LONG TERM GOAL #8   Title Floor to stand transfer with S using UE support on chair/mat   (08-23-15)   Time 4   Period Weeks    Status New               Plan - 07/31/15 2133    Clinical Impression Statement Pt. progressing well towards LTG's - cont to have decr. coordination and decr. single limb stance   Pt will benefit from skilled therapeutic intervention in order to improve on the following deficits Abnormal gait;Decreased balance;Decreased mobility;Decreased strength;Decreased activity tolerance   Rehab Potential Good   PT Frequency 2x / week   PT Duration 4 weeks   PT Treatment/Interventions ADLs/Self Care Home Management;Functional mobility training;Stair training;Gait training;Therapeutic activities;Therapeutic exercise;Balance training;Neuromuscular re-education;Patient/family education   PT Next Visit Plan cont balance and gait training   PT Home Exercise Plan balance   Consulted and Agree with Plan of Care Patient        Problem List Patient Active Problem List   Diagnosis Date Noted  . Chest pain   . SOB (shortness of breath)   . Hyperlipidemia   . PAF (paroxysmal atrial fibrillation)   . Varicose veins   . Decreased diffusion capacity     Kary Kos, PT 07/31/2015, 9:36 PM  [facility] [address] [place], [place], [address] Phone: [phone]   Fax:  [phone]

## 2015-07-31 NOTE — Therapy (Signed)
[facility] [address] [place], [place], [address] Phone: [phone]   Fax:  [phone]  Physical Therapy Treatment  Patient Details  Name: Cheryl Peterson MRN: [id number] Date of Birth: 11-30-1929 Referring Provider:  Martha Clan, MD  Encounter Date: 07/28/2015      PT End of Session - 07/31/15 1047    Visit Number 10  G2   Number of Visits 17   Date for PT Re-Evaluation 08/23/15   Authorization Type [employer] Medicare   Authorization Time Period 07-23-15 - 09-21-15   PT Start Time 1535   PT Stop Time 1620   PT Time Calculation (min) 45 min   Equipment Utilized During Treatment Gait belt      Past Medical History  Diagnosis Date  . Chest pain   . SOB (shortness of breath)   . Decreased diffusion capacity   . Hypertension   . Hyperlipidemia     Past Surgical History  Procedure Laterality Date  . Tonsillectomy  1939  . Vesicovaginal fistula closure w/ tah  10/28/84    There were no vitals filed for this visit.  Visit Diagnosis:  Abnormality of gait  Lack of coordination      Subjective Assessment - 07/31/15 1042    Subjective "I feel much better today"   Pertinent History Paroxysmal atrial fibrillation, arthritis   Patient Stated Goals Improve balance   Currently in Pain? No/denies                         [facility] Adult PT Treatment/Exercise - 07/31/15 0001    Ambulation/Gait   Stairs Yes   Stairs Assistance 4: Min guard   Stair Management Technique Forwards;Alternating pattern;No rails  use of rail with LOB with descending steps   Number of Stairs 4   Height of Stairs 6             Balance Exercises - 07/31/15 1044    Balance Exercises: Standing   Standing Eyes Opened Foam/compliant surface;Head turns;5 reps;Wide (BOA)   Standing Eyes Closed Wide (BOA);Solid surface;3 reps;10 secs;Foam/compliant surface  with SBA on floor; with min to mod assist on foam   SLS Eyes  open   Rockerboard Anterior/posterior;Lateral;EO   Sidestepping 3 reps  crossovers front added on reps 2 & 3   Other Standing Exercises standing on BOSU - weight shifts 10 times front to back and attempted moving each leg up and back with bil UE support;  alternate tap ups to 6" step 10 reps with each LE                                                  TherEx;  Bil. Hip extension and abduction with green theraband x 10 reps each leg in standing  Nustep level 7 (per pt's request to attempt this level) x 2"; changed to level 4 for remaining 3" - with UE and LE's           PT Long Term Goals - 07/25/15 1356    PT LONG TERM GOAL #1   Title Pt. will improve Berg balance test score to >/= 48/56 to decr. fall risk  (target date 07-26-15)   Baseline 47/56   Time 4   Period Weeks   Status On-going   PT LONG TERM  GOAL #2   Title Pt. will negotiate steps using a step over step sequence with use of 1 hand rail to demo improved balance.   (07-26-15)   Status Achieved   PT LONG TERM GOAL #3   Title Improve Functional gait test score by at least 3 points to demo improved balance with amb.  (07-26-15)   Status On-going   PT LONG TERM GOAL #4   Title Improve FOTO by at least 10 points for improved pt satisfaction  (07-26-15)   Status On-going   PT LONG TERM GOAL #5   Title Independent in HEP for balance exercises   (07-26-15)   Status Achieved   Additional Long Term Goals   Additional Long Term Goals Yes   PT LONG TERM GOAL #6   Title Berg score >/= 50/56 for decr. fall risk  (08-23-15)   Baseline 47/56   Time 4   Period Weeks   Status New   PT LONG TERM GOAL #7   Title Negotiate steps without rail using a step by step sequence  (08-23-15)   Time 4   Period Weeks   Status New   PT LONG TERM GOAL #8   Title Floor to stand transfer with S using UE support on chair/mat   (08-23-15)   Time 4   Period Weeks   Status New               Plan - 07/31/15 1050    Clinical Impression  Statement Pt improving with balance and gait - cont to have more difficulty with R single limb stance than with L single limb stance   Pt will benefit from skilled therapeutic intervention in order to improve on the following deficits Abnormal gait;Decreased balance;Decreased mobility;Decreased strength;Decreased activity tolerance   Rehab Potential Good   PT Frequency 2x / week   PT Duration 4 weeks   PT Treatment/Interventions ADLs/Self Care Home Management;Functional mobility training;Stair training;Gait training;Therapeutic activities;Therapeutic exercise;Balance training;Neuromuscular re-education;Patient/family education   PT Next Visit Plan cont balance and gait training   PT Home Exercise Plan balance   Consulted and Agree with Plan of Care Patient        Problem List Patient Active Problem List   Diagnosis Date Noted  . Chest pain   . SOB (shortness of breath)   . Hyperlipidemia   . PAF (paroxysmal atrial fibrillation)   . Varicose veins   . Decreased diffusion capacity     Kary Kos, PT 07/31/2015, 10:52 AM  [facility] [address] [place], [place], [address] Phone: [phone]   Fax:  [phone]

## 2015-08-04 ENCOUNTER — Ambulatory Visit: Payer: Medicare Other | Admitting: Physical Therapy

## 2015-08-04 DIAGNOSIS — R269 Unspecified abnormalities of gait and mobility: Secondary | ICD-10-CM | POA: Diagnosis not present

## 2015-08-04 DIAGNOSIS — R279 Unspecified lack of coordination: Secondary | ICD-10-CM

## 2015-08-06 ENCOUNTER — Ambulatory Visit: Payer: Medicare Other | Admitting: Physical Therapy

## 2015-08-06 DIAGNOSIS — R269 Unspecified abnormalities of gait and mobility: Secondary | ICD-10-CM

## 2015-08-06 DIAGNOSIS — R279 Unspecified lack of coordination: Secondary | ICD-10-CM

## 2015-08-07 ENCOUNTER — Encounter: Payer: Self-pay | Admitting: Physical Therapy

## 2015-08-07 NOTE — Therapy (Signed)
[facility] [address] [place], [place], [address] Phone: [phone]   Fax:  [phone]  Physical Therapy Treatment  Patient Details  Name: Cheryl Peterson MRN: [id number] Date of Birth: [birth year] Referring Provider:  Martha Clan, MD  Encounter Date: 08/04/2015      PT End of Session - 08/07/15 0901    Visit Number --  G4      Past Medical History  Diagnosis Date  . Chest pain   . SOB (shortness of breath)   . Decreased diffusion capacity   . Hypertension   . Hyperlipidemia     Past Surgical History  Procedure Laterality Date  . Tonsillectomy  1939  . Vesicovaginal fistula closure w/ tah  10/28/84    There were no vitals filed for this visit.  Visit Diagnosis:  Abnormality of gait  Lack of coordination      Subjective Assessment - 08/07/15 0848    Subjective "Balance seems better"   Pertinent History Paroxysmal atrial fibrillation, arthritis   Patient Stated Goals Improve balance   Currently in Pain? No/denies                         [facility] Adult PT Treatment/Exercise - 08/07/15 0001    Transfers   Transfers Sit to Stand   Sit to Stand 5: Supervision  on compliant surface 5 reps without UE support   Number of Reps 10 reps   Ambulation/Gait   Stairs Yes   Stairs Assistance 4: Min guard   Stair Management Technique Forwards;Alternating pattern;No rails  use of rail with LOB with descending steps   Number of Stairs 4   Height of Stairs 6   Ramp 6: Modified independent (Device)   Curb 6: Modified independent (Device/increase time)   Lumbar Exercises: Aerobic   Stationary Bike 5" on Nustep level 4     TherEx:  Heel raises x 10 reps bil. LE's; 10 reps RLE and 10 reps LLE single limb        Balance Exercises - 08/07/15 0858    Balance Exercises: Standing   Standing Eyes Opened Narrow base of support (BOS);Foam/compliant surface;2 reps;20 secs   Standing Eyes Closed  Wide (BOA);Foam/compliant surface;Solid surface;1 rep;10 secs   SLS Eyes open;Solid surface;2 reps;10 secs   Rockerboard Anterior/posterior;30 seconds   Step Ups Forward;6 inch;UE support 2   Gait with Head Turns Forward;1 rep   Sidestepping 1 rep   Other Standing Exercises standing on BOSU - weight shifts 10 times front to back and attempted moving each leg up and back with bil UE support;  alternate tap ups to 6" step 10 reps with each LE                                                             PT Long Term Goals - 07/25/15 1356    PT LONG TERM GOAL #1   Title Pt. will improve Berg balance test score to >/= 48/56 to decr. fall risk  (target date 07-26-15)   Baseline 47/56   Time 4   Period Weeks   Status On-going   PT LONG TERM GOAL #2   Title Pt. will negotiate steps using a step over step sequence with use of  1 hand rail to demo improved balance.   (07-26-15)   Status Achieved   PT LONG TERM GOAL #3   Title Improve Functional gait test score by at least 3 points to demo improved balance with amb.  (07-26-15)   Status On-going   PT LONG TERM GOAL #4   Title Improve FOTO by at least 10 points for improved pt satisfaction  (07-26-15)   Status On-going   PT LONG TERM GOAL #5   Title Independent in HEP for balance exercises   (07-26-15)   Status Achieved   Additional Long Term Goals   Additional Long Term Goals Yes   PT LONG TERM GOAL #6   Title Berg score >/= 50/56 for decr. fall risk  (08-23-15)   Baseline 47/56   Time 4   Period Weeks   Status New   PT LONG TERM GOAL #7   Title Negotiate steps without rail using a step by step sequence  (08-23-15)   Time 4   Period Weeks   Status New   PT LONG TERM GOAL #8   Title Floor to stand transfer with S using UE support on chair/mat   (08-23-15)   Time 4   Period Weeks   Status New               Plan - 08/07/15 1610    Clinical Impression Statement Pt. cont to have decr. high level balanc skills but is  progressing with gait and balance   Pt will benefit from skilled therapeutic intervention in order to improve on the following deficits Abnormal gait;Decreased balance;Decreased mobility;Decreased strength;Decreased activity tolerance   Rehab Potential Good   PT Frequency 2x / week   PT Duration 4 weeks   PT Treatment/Interventions ADLs/Self Care Home Management;Functional mobility training;Stair training;Gait training;Therapeutic activities;Therapeutic exercise;Balance training;Neuromuscular re-education;Patient/family education   PT Next Visit Plan cont balance and gait training   PT Home Exercise Plan balance   Consulted and Agree with Plan of Care Patient        Problem List Patient Active Problem List   Diagnosis Date Noted  . Chest pain   . SOB (shortness of breath)   . Hyperlipidemia   . PAF (paroxysmal atrial fibrillation)   . Varicose veins   . Decreased diffusion capacity     Kary Kos, PT 08/07/2015, 9:03 AM  [facility] [address] [place], [place], [address] Phone: [phone]   Fax:  [phone]

## 2015-08-09 ENCOUNTER — Encounter: Payer: Self-pay | Admitting: Physical Therapy

## 2015-08-09 NOTE — Therapy (Signed)
[facility] [address] [place], [place], [address] Phone: [phone]   Fax:  [phone]  Physical Therapy Treatment  Patient Details  Name: Cheryl Peterson MRN: [id number] Date of Birth: 21-Apr-1930 Referring Provider:  Martha Clan, MD  Encounter Date: 08/06/2015      PT End of Session - 08/09/15 1504    Visit Number 13  G5   Number of Visits 17   Date for PT Re-Evaluation 08/23/15   Authorization Type [employer] Medicare   Authorization Time Period 07-23-15 - 09-21-15   PT Start Time 1104   PT Stop Time 1150   PT Time Calculation (min) 46 min   Equipment Utilized During Treatment Gait belt      Past Medical History  Diagnosis Date  . Chest pain   . SOB (shortness of breath)   . Decreased diffusion capacity   . Hypertension   . Hyperlipidemia     Past Surgical History  Procedure Laterality Date  . Tonsillectomy  1939  . Vesicovaginal fistula closure w/ tah  10/28/84    There were no vitals filed for this visit.  Visit Diagnosis:  Abnormality of gait  Lack of coordination      Subjective Assessment - 08/09/15 1501    Subjective Pt. denies changes/falls "doing ok"; states she has checked into joining [facility] after D/C from PT   Pertinent History Paroxysmal atrial fibrillation, arthritis   Patient Stated Goals Improve balance   Currently in Pain? No/denies                              Balance Exercises - 08/09/15 1502    Balance Exercises: Standing   Standing Eyes Opened Narrow base of support (BOS);Foam/compliant surface;2 reps;20 secs   Standing Eyes Closed Wide (BOA);Foam/compliant surface;Solid surface;1 rep;10 secs   Tandem Stance Eyes open;Intermittent upper extremity support;2 reps;10 secs   SLS Eyes open;Solid surface;2 reps;10 secs   Rockerboard Anterior/posterior;30 seconds   Step Ups Forward;6 inch;UE support 2   Gait with Head Turns Forward;1 rep  100'  with horizontal head turns   Tandem Gait Forward;1 rep   Sidestepping 2 reps   Other Standing Exercises standing on BOSU - weight shifts 10 times front to back and attempted moving each leg up and back with bil UE support;  alternate tap ups to 6" step 10 reps with each LE                                                             PT Long Term Goals - 07/25/15 1356    PT LONG TERM GOAL #1   Title Pt. will improve Berg balance test score to >/= 48/56 to decr. fall risk  (target date 07-26-15)   Baseline 47/56   Time 4   Period Weeks   Status On-going   PT LONG TERM GOAL #2   Title Pt. will negotiate steps using a step over step sequence with use of 1 hand rail to demo improved balance.   (07-26-15)   Status Achieved   PT LONG TERM GOAL #3   Title Improve Functional gait test score by at least 3 points to demo improved balance with amb.  (07-26-15)  Status On-going   PT LONG TERM GOAL #4   Title Improve FOTO by at least 10 points for improved pt satisfaction  (07-26-15)   Status On-going   PT LONG TERM GOAL #5   Title Independent in HEP for balance exercises   (07-26-15)   Status Achieved   Additional Long Term Goals   Additional Long Term Goals Yes   PT LONG TERM GOAL #6   Title Berg score >/= 50/56 for decr. fall risk  (08-23-15)   Baseline 47/56   Time 4   Period Weeks   Status New   PT LONG TERM GOAL #7   Title Negotiate steps without rail using a step by step sequence  (08-23-15)   Time 4   Period Weeks   Status New   PT LONG TERM GOAL #8   Title Floor to stand transfer with S using UE support on chair/mat   (08-23-15)   Time 4   Period Weeks   Status New               Plan - 08/09/15 1506    Clinical Impression Statement Pt. progressing well - pt is now able to perform tandem position for approx. 5 sec hold without UE support in standing   Pt will benefit from skilled therapeutic intervention in order to improve on the following deficits Abnormal  gait;Decreased balance;Decreased mobility;Decreased strength;Decreased activity tolerance   Rehab Potential Good   PT Frequency 2x / week   PT Duration 4 weeks   PT Treatment/Interventions ADLs/Self Care Home Management;Functional mobility training;Stair training;Gait training;Therapeutic activities;Therapeutic exercise;Balance training;Neuromuscular re-education;Patient/family education   PT Next Visit Plan cont balance and gait training   PT Home Exercise Plan balance   Consulted and Agree with Plan of Care Patient        Problem List Patient Active Problem List   Diagnosis Date Noted  . Chest pain   . SOB (shortness of breath)   . Hyperlipidemia   . PAF (paroxysmal atrial fibrillation)   . Varicose veins   . Decreased diffusion capacity     Kary Kos, PT 08/09/2015, 3:08 PM  [facility] [address] [place], [place], [address] Phone: [phone]   Fax:  [phone]

## 2015-08-10 ENCOUNTER — Ambulatory Visit: Payer: Medicare Other | Admitting: Physical Therapy

## 2015-08-10 DIAGNOSIS — R279 Unspecified lack of coordination: Secondary | ICD-10-CM

## 2015-08-10 DIAGNOSIS — R269 Unspecified abnormalities of gait and mobility: Secondary | ICD-10-CM | POA: Diagnosis not present

## 2015-08-11 ENCOUNTER — Encounter: Payer: Self-pay | Admitting: Physical Therapy

## 2015-08-11 NOTE — Therapy (Signed)
[facility] [address] [place], [place], [address] Phone: [phone]   Fax:  [phone]  Physical Therapy Treatment  Patient Details  Name: Cheryl Peterson MRN: [id number] Date of Birth: 03-Jul-1930 Referring Provider:  Martha Clan, MD  Encounter Date: 08/10/2015      PT End of Session - 08/11/15 2138    Visit Number 14  G6   Number of Visits 17   Date for PT Re-Evaluation 08/23/15   Authorization Type [employer] Medicare   Authorization Time Period 07-23-15 - 09-21-15   PT Start Time 1404   PT Stop Time 1450   PT Time Calculation (min) 46 min      Past Medical History  Diagnosis Date  . Chest pain   . SOB (shortness of breath)   . Decreased diffusion capacity   . Hypertension   . Hyperlipidemia     Past Surgical History  Procedure Laterality Date  . Tonsillectomy  1939  . Vesicovaginal fistula closure w/ tah  10/28/84    There were no vitals filed for this visit.  Visit Diagnosis:  Abnormality of gait  Lack of coordination      Subjective Assessment - 08/11/15 2133    Subjective Pt reports she is tired today - had a busy morning with meetings and other appts.   Pertinent History Paroxysmal atrial fibrillation, arthritis   Patient Stated Goals Improve balance   Currently in Pain? No/denies                         [facility] Adult PT Treatment/Exercise - 08/11/15 0001    Transfers   Transfers Sit to Stand   Sit to Stand 5: Supervision  on compliant surface 5 reps without UE support   Number of Reps 10 reps;1 set   Ambulation/Gait   Ambulation/Gait Yes   Ambulation/Gait Assistance 5: Supervision   Ambulation/Gait Assistance Details cues for incr. step length and arm swing   Ambulation Distance (Feet) 250 Feet   Assistive device None   Gait Pattern Wide base of support;Decreased step length - right;Decreased step length - left   Ambulation Surface Level;Indoor   Stairs Yes    Stairs Assistance 4: Min guard   Stair Management Technique Forwards;Alternating pattern;No rails  use of rail with LOB with descending steps   Number of Stairs 4   Height of Stairs 6   Ramp 6: Modified independent (Device)   Curb 6: Modified independent (Device/increase time)   Lumbar Exercises: Aerobic   Stationary Bike 6" on Nustep level 4   Lumbar Exercises: Standing   Heel Raises 10 reps             Balance Exercises - 08/11/15 2136    Balance Exercises: Standing   Standing Eyes Opened Wide (BOA);Head turns;Foam/compliant surface;2 reps;10 secs   Standing Eyes Closed Wide (BOA);Foam/compliant surface;1 rep;10 secs   Tandem Stance Eyes open;Intermittent upper extremity support;5 reps;10 secs   Stepping Strategy Anterior;10 reps   Step Ups Forward;6 inch;UE support 1   Tandem Gait Forward;2 reps;Upper extremity support   Sidestepping 3 reps  braiding inside bars                PT Long Term Goals - 07/25/15 1356    PT LONG TERM GOAL #1   Title Pt. will improve Berg balance test score to >/= 48/56 to decr. fall risk  (target date 07-26-15)   Baseline 47/56   Time 4  Period Weeks   Status On-going   PT LONG TERM GOAL #2   Title Pt. will negotiate steps using a step over step sequence with use of 1 hand rail to demo improved balance.   (07-26-15)   Status Achieved   PT LONG TERM GOAL #3   Title Improve Functional gait test score by at least 3 points to demo improved balance with amb.  (07-26-15)   Status On-going   PT LONG TERM GOAL #4   Title Improve FOTO by at least 10 points for improved pt satisfaction  (07-26-15)   Status On-going   PT LONG TERM GOAL #5   Title Independent in HEP for balance exercises   (07-26-15)   Status Achieved   Additional Long Term Goals   Additional Long Term Goals Yes   PT LONG TERM GOAL #6   Title Berg score >/= 50/56 for decr. fall risk  (08-23-15)   Baseline 47/56   Time 4   Period Weeks   Status New   PT LONG TERM GOAL #7    Title Negotiate steps without rail using a step by step sequence  (08-23-15)   Time 4   Period Weeks   Status New   PT LONG TERM GOAL #8   Title Floor to stand transfer with S using UE support on chair/mat   (08-23-15)   Time 4   Period Weeks   Status New               Plan - 08/11/15 2139    Clinical Impression Statement Pt. cont to progress well towards LTG's - plan discharge next week; coordination is improving in bil. LE's   Pt will benefit from skilled therapeutic intervention in order to improve on the following deficits Abnormal gait;Decreased balance;Decreased mobility;Decreased strength;Decreased activity tolerance   Rehab Potential Good   PT Frequency 2x / week   PT Duration 4 weeks   PT Treatment/Interventions ADLs/Self Care Home Management;Functional mobility training;Stair training;Gait training;Therapeutic activities;Therapeutic exercise;Balance training;Neuromuscular re-education;Patient/family education   PT Next Visit Plan cont balance and gait training   PT Home Exercise Plan balance   Consulted and Agree with Plan of Care Patient        Problem List Patient Active Problem List   Diagnosis Date Noted  . Chest pain   . SOB (shortness of breath)   . Hyperlipidemia   . PAF (paroxysmal atrial fibrillation)   . Varicose veins   . Decreased diffusion capacity     Kary Kos, PT 08/11/2015, 9:42 PM  [facility] [address] [place], [place], [address] Phone: [phone]   Fax:  [phone]

## 2015-08-13 ENCOUNTER — Ambulatory Visit: Payer: Medicare Other | Admitting: Physical Therapy

## 2015-08-13 DIAGNOSIS — R269 Unspecified abnormalities of gait and mobility: Secondary | ICD-10-CM | POA: Diagnosis not present

## 2015-08-13 DIAGNOSIS — R279 Unspecified lack of coordination: Secondary | ICD-10-CM

## 2015-08-16 ENCOUNTER — Encounter: Payer: Self-pay | Admitting: Physical Therapy

## 2015-08-16 NOTE — Therapy (Signed)
[facility] [address] [place], [place], [address] Phone: [phone]   Fax:  [phone]  Physical Therapy Treatment  Patient Details  Name: Cheryl Peterson MRN: [id number] Date of Birth: 1930-04-07 Referring Provider:  Martha Clan, MD  Encounter Date: 08/13/2015      PT End of Session - 08/16/15 2143    Visit Number 15   Number of Visits 17   Date for PT Re-Evaluation 08/23/15   Authorization Type [employer] Medicare   Authorization Time Period 07-23-15 - 09-21-15   PT Start Time 1315   PT Stop Time 1401   PT Time Calculation (min) 46 min      Past Medical History  Diagnosis Date  . Chest pain   . SOB (shortness of breath)   . Decreased diffusion capacity   . Hypertension   . Hyperlipidemia     Past Surgical History  Procedure Laterality Date  . Tonsillectomy  1939  . Vesicovaginal fistula closure w/ tah  10/28/84    There were no vitals filed for this visit.  Visit Diagnosis:  Abnormality of gait  Lack of coordination      Subjective Assessment - 08/16/15 2139    Subjective Pt reports feeling better  - feels that balance continues to improve and states that she is able to walk straighter   Pertinent History Paroxysmal atrial fibrillation, arthritis   Patient Stated Goals Improve balance   Currently in Pain? No/denies                         [facility] Adult PT Treatment/Exercise - 08/16/15 0001    Transfers   Transfers Sit to Stand   Sit to Stand 5: Supervision  on compliant surfaces   Number of Reps 10 reps   Ambulation/Gait   Ambulation/Gait Yes   Ambulation/Gait Assistance 6: Modified independent (Device/Increase time)   Ambulation/Gait Assistance Details cues for incr. step length and incr. arm swing   Ambulation Distance (Feet) 250 Feet   Assistive device None   Gait Pattern Within Functional Limits   Ambulation Surface Level;Indoor   Stairs Yes   Stairs Assistance 4: Min  guard   Stair Management Technique No rails;Forwards   Number of Stairs 4   Height of Stairs 6   Ramp 6: Modified independent (Device)   Curb 6: Modified independent (Device/increase time)             Balance Exercises - 08/16/15 2142    Balance Exercises: Standing   Standing Eyes Opened Wide (BOA);Head turns;Foam/compliant surface;2 reps;10 secs   Standing Eyes Closed Wide (BOA);Foam/compliant surface;1 rep;10 secs   Tandem Stance Eyes open;Intermittent upper extremity support;5 reps;10 secs   SLS Eyes open;Solid surface;2 reps;10 secs   Stepping Strategy Anterior;10 reps   Rockerboard Anterior/posterior;30 seconds   Step Ups Forward;6 inch;UE support 1   Gait with Head Turns Forward;1 rep  100' with horizontal head turns   Tandem Gait Forward;2 reps;Upper extremity support   Cone Rotation Solid surface;R/L  some mild LOB with turns   Other Standing Exercises standing on BOSU - weight shifts 10 times front to back and attempted moving each leg up and back with bil UE support;  alternate tap ups to 6" step 10 reps with each LE  TherEx; bil. Hip extension and abduction with green theraband x 10 reps each leg; heel raises x 10 reps Nustep level 4 x 5"           PT Long Term Goals - 07/25/15 1356    PT LONG TERM GOAL #1   Title Pt. will improve Berg balance test score to >/= 48/56 to decr. fall risk  (target date 07-26-15)   Baseline 47/56   Time 4   Period Weeks   Status On-going   PT LONG TERM GOAL #2   Title Pt. will negotiate steps using a step over step sequence with use of 1 hand rail to demo improved balance.   (07-26-15)   Status Achieved   PT LONG TERM GOAL #3   Title Improve Functional gait test score by at least 3 points to demo improved balance with amb.  (07-26-15)   Status On-going   PT LONG TERM GOAL #4   Title Improve FOTO by at least 10 points for improved pt satisfaction  (07-26-15)   Status On-going    PT LONG TERM GOAL #5   Title Independent in HEP for balance exercises   (07-26-15)   Status Achieved   Additional Long Term Goals   Additional Long Term Goals Yes   PT LONG TERM GOAL #6   Title Berg score >/= 50/56 for decr. fall risk  (08-23-15)   Baseline 47/56   Time 4   Period Weeks   Status New   PT LONG TERM GOAL #7   Title Negotiate steps without rail using a step by step sequence  (08-23-15)   Time 4   Period Weeks   Status New   PT LONG TERM GOAL #8   Title Floor to stand transfer with S using UE support on chair/mat   (08-23-15)   Time 4   Period Weeks   Status New               Plan - 08/16/15 2145    Clinical Impression Statement Pt.'s gait is improving with less sway noted - pt is progressing towards LTG's   Pt will benefit from skilled therapeutic intervention in order to improve on the following deficits Abnormal gait;Decreased balance;Decreased mobility;Decreased strength;Decreased activity tolerance   Rehab Potential Good   PT Frequency 2x / week   PT Duration 4 weeks   PT Treatment/Interventions ADLs/Self Care Home Management;Functional mobility training;Stair training;Gait training;Therapeutic activities;Therapeutic exercise;Balance training;Neuromuscular re-education;Patient/family education   PT Next Visit Plan cont balance and gait training   PT Home Exercise Plan balance   Consulted and Agree with Plan of Care Patient        Problem List Patient Active Problem List   Diagnosis Date Noted  . Chest pain   . SOB (shortness of breath)   . Hyperlipidemia   . PAF (paroxysmal atrial fibrillation)   . Varicose veins   . Decreased diffusion capacity     Kary Kos, PT 08/16/2015, 9:49 PM  [facility] [address] [place], [place], [address] Phone: [phone]   Fax:  [phone]

## 2015-08-17 ENCOUNTER — Ambulatory Visit: Payer: Medicare Other | Admitting: Physical Therapy

## 2015-08-17 DIAGNOSIS — R279 Unspecified lack of coordination: Secondary | ICD-10-CM

## 2015-08-17 DIAGNOSIS — R269 Unspecified abnormalities of gait and mobility: Secondary | ICD-10-CM

## 2015-08-19 ENCOUNTER — Encounter: Payer: Self-pay | Admitting: Physical Therapy

## 2015-08-19 NOTE — Therapy (Signed)
[facility] [address] [place], [place], [address] Phone: [phone]   Fax:  [phone]  Physical Therapy Treatment  Patient Details  Name: Cheryl Peterson MRN: [id number] Date of Birth: 05/11/1930 Referring Provider:  Martha Clan, MD  Encounter Date: 08/17/2015      PT End of Session - 08/19/15 0905    Visit Number 16   Number of Visits 17   Date for PT Re-Evaluation 08/23/15   Authorization Type [employer] Medicare   Authorization Time Period 07-23-15 - 09-21-15   PT Start Time 1316   PT Stop Time 1402   PT Time Calculation (min) 46 min      Past Medical History  Diagnosis Date  . Chest pain   . SOB (shortness of breath)   . Decreased diffusion capacity   . Hypertension   . Hyperlipidemia     Past Surgical History  Procedure Laterality Date  . Tonsillectomy  1939  . Vesicovaginal fistula closure w/ tah  10/28/84    There were no vitals filed for this visit.  Visit Diagnosis:  Abnormality of gait  Lack of coordination      Subjective Assessment - 08/19/15 0902    Subjective Pt reports no chnages - continues to feel that she is getting stronger   Pertinent History Paroxysmal atrial fibrillation, arthritis   Patient Stated Goals Improve balance   Currently in Pain? No/denies                         [facility] Adult PT Treatment/Exercise - 08/19/15 0001    Transfers   Transfers Sit to Stand   Sit to Stand 5: Supervision  on compliant surfaces   Number of Reps --  5 reps   Ambulation/Gait   Stairs Yes   Stairs Assistance 4: Min guard   Number of Stairs 4   Height of Stairs 6   Ramp 6: Modified independent (Device)   Curb 6: Modified independent (Device/increase time)   Lumbar Exercises: Aerobic   Stationary Bike 5" on Nustep level 4   Lumbar Exercises: Standing   Heel Raises 10 reps             Balance Exercises - 08/19/15 0905    Balance Exercises: Standing   Standing Eyes Opened Wide (BOA);Head turns;Foam/compliant surface;2 reps;10 secs   Tandem Stance Eyes open;Intermittent upper extremity support;5 reps;10 secs   SLS Eyes open;Solid surface;2 reps;10 secs   Rockerboard Anterior/posterior;30 seconds   Step Ups Forward;6 inch;UE support 1   Tandem Gait Forward;2 reps;Upper extremity support   Partial Tandem Stance Eyes open;2 reps;20 secs   Sidestepping 2 reps  braiding 10' x 4 reps with UE support prn   Other Standing Exercises standing on BOSU - weight shifts 10 times front to back and attempted moving each leg up and back with bil UE support;  alternate tap ups to 6" step 10 reps with each LE                                                             PT Long Term Goals - 07/25/15 1356    PT LONG TERM GOAL #1   Title Pt. will improve Berg balance test score to >/= 48/56 to decr.  fall risk  (target date 07-26-15)   Baseline 47/56   Time 4   Period Weeks   Status On-going   PT LONG TERM GOAL #2   Title Pt. will negotiate steps using a step over step sequence with use of 1 hand rail to demo improved balance.   (07-26-15)   Status Achieved   PT LONG TERM GOAL #3   Title Improve Functional gait test score by at least 3 points to demo improved balance with amb.  (07-26-15)   Status On-going   PT LONG TERM GOAL #4   Title Improve FOTO by at least 10 points for improved pt satisfaction  (07-26-15)   Status On-going   PT LONG TERM GOAL #5   Title Independent in HEP for balance exercises   (07-26-15)   Status Achieved   Additional Long Term Goals   Additional Long Term Goals Yes   PT LONG TERM GOAL #6   Title Berg score >/= 50/56 for decr. fall risk  (08-23-15)   Baseline 47/56   Time 4   Period Weeks   Status New   PT LONG TERM GOAL #7   Title Negotiate steps without rail using a step by step sequence  (08-23-15)   Time 4   Period Weeks   Status New   PT LONG TERM GOAL #8   Title Floor to stand transfer with S using UE support  on chair/mat   (08-23-15)   Time 4   Period Weeks   Status New               Plan - 08/19/15 0906    Clinical Impression Statement Pt's gait is more steady with less sway noted - balance is improving - pt feels ready for discharge this week   Pt will benefit from skilled therapeutic intervention in order to improve on the following deficits Abnormal gait;Decreased balance;Decreased mobility;Decreased strength;Decreased activity tolerance   Rehab Potential Good   PT Frequency 2x / week   PT Duration 4 weeks   PT Treatment/Interventions ADLs/Self Care Home Management;Functional mobility training;Stair training;Gait training;Therapeutic activities;Therapeutic exercise;Balance training;Neuromuscular re-education;Patient/family education   PT Next Visit Plan cont balance and gait training - plan discharge next session   PT Home Exercise Plan balance   Consulted and Agree with Plan of Care Patient        Problem List Patient Active Problem List   Diagnosis Date Noted  . Chest pain   . SOB (shortness of breath)   . Hyperlipidemia   . PAF (paroxysmal atrial fibrillation)   . Varicose veins   . Decreased diffusion capacity     Kary Kos, PT 08/19/2015, 9:11 AM  [facility] [address] [place], [place], [address] Phone: [phone]   Fax:  [phone]

## 2015-08-20 ENCOUNTER — Ambulatory Visit: Payer: Medicare Other | Admitting: Physical Therapy

## 2015-08-20 DIAGNOSIS — R279 Unspecified lack of coordination: Secondary | ICD-10-CM

## 2015-08-20 DIAGNOSIS — R269 Unspecified abnormalities of gait and mobility: Secondary | ICD-10-CM | POA: Diagnosis not present

## 2015-08-22 ENCOUNTER — Encounter: Payer: Self-pay | Admitting: Physical Therapy

## 2015-08-22 NOTE — Therapy (Signed)
[facility] [address] [place], [place], [address] Phone: [phone]   Fax:  [phone]  Physical Therapy Treatment  Patient Details  Name: Cheryl Peterson MRN: [id number] Date of Birth: [birth year] Referring Provider:  Marton Redwood, MD  Encounter Date: 08/20/2015      PT End of Session - 08/22/15 0920    Visit Number 17   Number of Visits 17   Date for PT Re-Evaluation 08/23/15   Authorization Type [employer]   Authorization Time Period 07-23-15 - 09-21-15   PT Start Time 1447   PT Stop Time 1530   PT Time Calculation (min) 43 min      Past Medical History  Diagnosis Date  . Chest pain   . SOB (shortness of breath)   . Decreased diffusion capacity   . Hypertension   . Hyperlipidemia     Past Surgical History  Procedure Laterality Date  . Tonsillectomy  1939  . Vesicovaginal fistula closure w/ tah  10/28/84    There were no vitals filed for this visit.  Visit Diagnosis:  Abnormality of gait  Lack of coordination      Subjective Assessment - 08/22/15 0911    Subjective Pt reports she is doing much better - plans on [place] to cont exercise after discharge from PT - wants to participate in exercise classes   Pertinent History Paroxysmal atrial fibrillation, arthritis   Patient Stated Goals Improve balance   Currently in Pain? No/denies            [facility] PT Assessment - 08/22/15 0001    Functional Gait  Assessment   Gait assessed  Yes   Gait Level Surface Walks 20 ft in less than 5.5 sec, no assistive devices, good speed, no evidence for imbalance, normal gait pattern, deviates no more than 6 in outside of the 12 in walkway width.   Change in Gait Speed Able to smoothly change walking speed without loss of balance or gait deviation. Deviate no more than 6 in outside of the 12 in walkway width.   Gait with Horizontal Head Turns Performs head turns smoothly with slight change in  gait velocity (eg, minor disruption to smooth gait path), deviates 6-10 in outside 12 in walkway width, or uses an assistive device.   Gait with Vertical Head Turns Performs task with slight change in gait velocity (eg, minor disruption to smooth gait path), deviates 6 - 10 in outside 12 in walkway width or uses assistive device   Gait and Pivot Turn Pivot turns safely within 3 sec and stops quickly with no loss of balance.   Step Over Obstacle Is able to step over one shoe box (4.5 in total height) without changing gait speed. No evidence of imbalance.   Gait with Narrow Base of Support Ambulates 4-7 steps.   Gait with Eyes Closed Walks 20 ft, slow speed, abnormal gait pattern, evidence for imbalance, deviates 10-15 in outside 12 in walkway width. Requires more than 9 sec to ambulate 20 ft.   Ambulating Backwards Walks 20 ft, uses assistive device, slower speed, mild gait deviations, deviates 6-10 in outside 12 in walkway width.   Steps Alternating feet, no rail.   Total Score 22   FGA comment: some difficulty with naroow base of support                     [facility] Adult PT Treatment/Exercise - 08/22/15 0001  Ambulation/Gait   Ambulation/Gait Yes   Ambulation/Gait Assistance 7: Independent   Ambulation Distance (Feet) 150 Feet   Assistive device None   Gait Pattern Within Functional Limits   Ambulation Surface Level;Indoor   Gait velocity 3.81   Stairs Yes   Stairs Assistance 5: Supervision   Stair Management Technique No rails;Forwards   Number of Stairs 4   Height of Stairs 6   Ramp 6: Modified independent (Device)   Curb 6: Modified independent (Device/increase time)   Berg Balance Test   Sit to Stand Able to stand without using hands and stabilize independently   Standing Unsupported Able to stand safely 2 minutes   Sitting with Back Unsupported but Feet Supported on Floor or Stool Able to sit safely and securely 2 minutes   Stand to Sit Sits safely with minimal use  of hands   Transfers Able to transfer safely, minor use of hands   Standing Unsupported with Eyes Closed Able to stand 10 seconds safely   Standing Ubsupported with Feet Together Able to place feet together independently and stand 1 minute safely   From Standing, Reach Forward with Outstretched Arm Can reach confidently >25 cm (10")   From Standing Position, Pick up Object from Floor Able to pick up shoe safely and easily   From Standing Position, Turn to Look Behind Over each Shoulder Looks behind from both sides and weight shifts well   Turn 360 Degrees Able to turn 360 degrees safely in 4 seconds or less   Standing Unsupported, Alternately Place Feet on Step/Stool Able to stand independently and safely and complete 8 steps in 20 seconds   Standing Unsupported, One Foot in Front Able to plae foot ahead of the other independently and hold 30 seconds   Standing on One Leg Tries to lift leg/unable to hold 3 seconds but remains standing independently   Total Score 52                     PT Long Term Goals - 08/22/15 0925    PT LONG TERM GOAL #1   Title Pt. will improve Berg balance test score to >/= 48/56 to decr. fall risk  (target date 07-26-15)   Baseline score 52/56 on 08-20-15   Status Achieved   PT LONG TERM GOAL #2   Title Pt. will negotiate steps using a step over step sequence with use of 1 hand rail to demo improved balance.   (07-26-15)   Status Achieved   PT LONG TERM GOAL #3   Title Improve Functional gait test score by at least 3 points to demo improved balance with amb.  (07-26-15)   Baseline improved from 17/30 to 22/30 on 08-20-15   Status Achieved   PT LONG TERM GOAL #4   Title Improve FOTO by at least 10 points for improved pt satisfaction  (07-26-15)   Baseline initial FOTO was not captured   Status Deferred   PT LONG TERM GOAL #5   Title Independent in HEP for balance exercises   (07-26-15)   Status Achieved   PT LONG TERM GOAL #6   Title Berg score >/=  50/56 for decr. fall risk  (08-23-15)   Baseline 52/56 on 08-20-15   Status Achieved   PT LONG TERM GOAL #7   Title Negotiate steps without rail using a step by step sequence  (08-23-15)   Baseline met 08-20-15   Status Achieved   PT LONG TERM GOAL #8  Title Floor to stand transfer with S using UE support on chair/mat   (08-23-15)   Baseline met 08-20-15   Status Achieved               Plan - 08/22/15 0927    Clinical Impression Statement Pt has met all LTG's - has made significant progress and is ready for discharge - pt to cont. at [facility] for participation in exercise classes to maintain current functional status achieved in PT   Pt will benefit from skilled therapeutic intervention in order to improve on the following deficits Abnormal gait;Decreased balance;Decreased mobility;Decreased strength;Decreased activity tolerance   Rehab Potential Good   PT Frequency 2x / week   PT Duration 4 weeks   PT Treatment/Interventions ADLs/Self Care Home Management;Functional mobility training;Stair training;Gait training;Therapeutic activities;Therapeutic exercise;Balance training;Neuromuscular re-education;Patient/family education   PT Next Visit Plan D/C   PT Home Exercise Plan balance and [facility]   Consulted and Agree with Plan of Care Patient        Problem List Patient Active Problem List   Diagnosis Date Noted  . Chest pain   . SOB (shortness of breath)   . Hyperlipidemia   . PAF (paroxysmal atrial fibrillation)   . Varicose veins   . Decreased diffusion capacity   PHYSICAL THERAPY DISCHARGE SUMMARY  Visits from Start of Care: 17  Current functional level related to goals / functional outcomes: All LTG's met - see above for progress   Remaining deficits: Continued decreased high level balance skills including decr. Single limb stance and decr. Tandem stance   Education / Equipment: Pt has been instructed in a balance HEP and reports compliance with this  program; pt has been given information on community Exercise programs and reports plan to join [employer] for participation in on-going exercise classes to maintain current functional level. Plan: Patient agrees to discharge.  Patient goals were met. Patient is being discharged due to meeting the stated rehab goals.  ?????       Alda Lea, PT 08/22/2015, 9:36 AM  [facility] [address] [place], [place], [address] Phone: [phone]   Fax:  [phone]

## 2015-10-19 ENCOUNTER — Ambulatory Visit (INDEPENDENT_AMBULATORY_CARE_PROVIDER_SITE_OTHER): Payer: Medicare Other | Admitting: Family Medicine

## 2015-10-19 VITALS — BP 132/80 | HR 85 | Temp 97.8°F | Resp 18 | Wt 161.2 lb

## 2015-10-19 DIAGNOSIS — S61402A Unspecified open wound of left hand, initial encounter: Secondary | ICD-10-CM | POA: Diagnosis not present

## 2015-10-19 NOTE — Progress Notes (Signed)
Patient ID: Cheryl Peterson, female    DOB: 1929-12-21  Age: 81 y.o. MRN: [id number]  Chief Complaint  Patient presents with  . Hand Injury    left hand     Subjective:   Patient was getting a toy out of her truck and bumped her back of her left hand. It caused a pretty significant cut. Her last tetanus shot was a year ago. She has no other major complaints. She says she can move her fingers fine. She came on over here to get sewed up. She is allergic to penicillin but no other meds.  Tetanus shot is up-to-date  Current allergies, medications, problem list, past/family and social histories reviewed.  Objective:  BP 132/80 mmHg  Pulse 85  Temp(Src) 97.8 F (36.6 C) (Oral)  Resp 18  Wt 161 lb 3.2 oz (73.12 kg)  SpO2 97%  No major acute distress. Has a 6 cm laceration on the back of her left hand, somewhat C-shaped. It is not just a skin tear, being cut down through the tissue levels. Does not appear to involve tendons. Functional fingers is fine as is sensation. Vascular intact.  Assessment & Plan:   Assessment: 1. Open wound of hand with complication, left, initial encounter       Plan: PA will repair of wound.  No orders of the defined types were placed in this encounter.    No orders of the defined types were placed in this encounter.         Patient Instructions  Return in about 10 days for sutures to be removed.  Return sooner at anytime if problems or concerns.  WOUND CARE Please return in 10 days to have your stitches/staples removed or sooner if you have concerns. Marland Kitchen. Keep area clean and dry for 24 hours. Do not remove bandage, if applied. . After 24 hours, remove bandage and wash wound gently with mild soap and warm water. Reapply a new bandage after cleaning wound, if directed. . Continue daily cleansing with soap and water until stitches/staples are removed. . Do not apply any ointments or creams to the wound while stitches/staples are in place, as  this may cause delayed healing. . Notify the office if you experience any of the following signs of infection: Swelling, redness, pus drainage, streaking, fever >101.0 F . Notify the office if you experience excessive bleeding that does not stop after 15-20 minutes of constant, firm pressure.      Return in about 10 days (around 10/29/2015).   Ivan Lacher, MD 10/19/2015

## 2015-10-19 NOTE — Progress Notes (Signed)
Risk and benefits discussed and verbal consent obtained. Anesthetic allergies reviewed. Patient anesthetized using 1:1 mix of 2% lidocaine with epi and Marcaine. The wound was cleansed thoroughly with soap and water. Sterile prep and drape. Wound closed with 7 thows throws using 4-0 Ethilon suture material. Hemostasis achieved. Mupirocin applied to the wound and bandage placed. The patient tolerated well. Wound instructions were provided and the patient is to return in 10 days for suture removal. Deliah BostonMichael Clark, MS, PA-C 4:27 PM, 10/19/2015

## 2015-10-19 NOTE — Patient Instructions (Addendum)
Return in about 10 days for sutures to be removed.  Return sooner at anytime if problems or concerns.  WOUND CARE Please return in 10 days to have your stitches/staples removed or sooner if you have concerns. Marland Kitchen. Keep area clean and dry for 24 hours. Do not remove bandage, if applied. . After 24 hours, remove bandage and wash wound gently with mild soap and warm water. Reapply a new bandage after cleaning wound, if directed. . Continue daily cleansing with soap and water until stitches/staples are removed. . Do not apply any ointments or creams to the wound while stitches/staples are in place, as this may cause delayed healing. . Notify the office if you experience any of the following signs of infection: Swelling, redness, pus drainage, streaking, fever >101.0 F . Notify the office if you experience excessive bleeding that does not stop after 15-20 minutes of constant, firm pressure.

## 2015-10-24 ENCOUNTER — Ambulatory Visit (INDEPENDENT_AMBULATORY_CARE_PROVIDER_SITE_OTHER): Payer: Medicare Other | Admitting: Physician Assistant

## 2015-10-24 DIAGNOSIS — S61402D Unspecified open wound of left hand, subsequent encounter: Secondary | ICD-10-CM

## 2015-10-24 NOTE — Progress Notes (Signed)
[facility] [address], [place] [address] [phone]  Date:  10/24/2015   Name:  Cheryl MeekerJean S Peterson   DOB:  [birth year]   MRN:  [id number]  PCP:  Martha ClanShaw, William, MD    Chief Complaint: No chief complaint on file.   History of Present Illness:  This is a 81 y.o. female who is presenting for wound care. She was seen here 10/19/15 for laceration repair on wound on her left hand. She states the bandage came lose last night and she feels she bought the wrong wrap for hand. She is having some mild pain she states "underneath the skin". Redness of the skin has not changed. No drainage, fever or chills. She has been applying vaseline and neosporin and keeping covered at all times.  Review of Systems:  Review of Systems See HPI  Patient Active Problem List   Diagnosis Date Noted  . Chest pain   . SOB (shortness of breath)   . Hyperlipidemia   . PAF (paroxysmal atrial fibrillation) (HCC)   . Varicose veins   . Decreased diffusion capacity     Prior to Admission medications   Medication Sig Start Date End Date Taking? Authorizing Provider  atorvastatin (LIPITOR) 10 MG tablet Take 10 mg by mouth daily.      Historical Provider, MD  PARoxetine (PAXIL) 20 MG tablet Take 20 mg by mouth every morning.      Historical Provider, MD    Allergies  Allergen Reactions  . Penicillins Swelling    Past Surgical History  Procedure Laterality Date  . Tonsillectomy  1939  . Vesicovaginal fistula closure w/ tah  10/28/84    Social History  Substance Use Topics  . Smoking status: Former Smoker    Quit date: 11/28/1978  . Smokeless tobacco: Never Used  . Alcohol Use: No    Family History  Problem Relation Age of Onset  . Stroke Mother   . Hypertension Mother   . Deep vein thrombosis Father   . Clotting disorder Sister   . Heart disease Sister   . Heart failure Sister   . Clotting disorder Brother   . Heart disease Brother   . Heart failure Brother   . Colon  cancer Neg Hx     Medication list has been reviewed and updated.  Physical Examination:  Physical Exam  Constitutional: She is oriented to person, place, and time. She appears well-developed and well-nourished. No distress.  HENT:  Head: Normocephalic and atraumatic.  Right Ear: Hearing normal.  Left Ear: Hearing normal.  Nose: Nose normal.  Eyes: Conjunctivae and lids are normal. Right eye exhibits no discharge. Left eye exhibits no discharge. No scleral icterus.  Pulmonary/Chest: Effort normal. No respiratory distress.  Musculoskeletal: Normal range of motion.  Neurological: She is alert and oriented to person, place, and time.  Skin: Skin is warm and dry.  Laceration on left dorsal hand with sutures in place. Wound looks a little macerated. Local erythema present. Mild swelling. Mild tenderness lateral to wound. No drainage or induration. Wound cleansed with soap and water. Small amount necrotic tissue removed. Dressing placed.  Psychiatric: She has a normal mood and affect. Her speech is normal and behavior is normal. Thought content normal.   There were no vitals taken for this visit.  Assessment and Plan:  1. Open wound of hand with complication, left, subsequent encounter Wound cleansed and redressed. Local erythema present and mild ttp. No purulence or induration. Wound a  little macerated looking. Does not appear infected. Advised she stop applying ointments and she may leave open to air for a few hours a day until wound has scabbed. Return 12/2 to have sutures removed.   [place]well Miners Dyke Brackett, MHS [facility]  10/24/2015

## 2015-10-29 ENCOUNTER — Ambulatory Visit (INDEPENDENT_AMBULATORY_CARE_PROVIDER_SITE_OTHER): Payer: Medicare Other | Admitting: Physician Assistant

## 2015-10-29 VITALS — BP 126/82 | HR 91 | Temp 98.0°F | Resp 17 | Ht 64.5 in | Wt 162.0 lb

## 2015-10-29 DIAGNOSIS — Z4802 Encounter for removal of sutures: Secondary | ICD-10-CM

## 2015-11-01 ENCOUNTER — Encounter: Payer: Self-pay | Admitting: Physician Assistant

## 2015-11-01 NOTE — Progress Notes (Signed)
[facility] [address], [place] [address] [phone]  Date:  10/29/2015   Name:  Cheryl MeekerJean S Peterson   DOB:  1930/03/31   MRN:  [id number]  PCP:  Cheryl ClanShaw, William, MD    History of Present Illness:  Cheryl Peterson is a 81 y.o. female patient [facility] laceration status post suture repair at right hand.  Patient reports that it is healing well.  She is keeping wound clean, and places vaseline along the wound.  No drainage, bleeding, swelling, or erythema.       Patient Active Problem List   Diagnosis Date Noted  . Chest pain   . SOB (shortness of breath)   . Hyperlipidemia   . PAF (paroxysmal atrial fibrillation) (HCC)   . Varicose veins   . Decreased diffusion capacity     Past Medical History  Diagnosis Date  . Chest pain   . SOB (shortness of breath)   . Decreased diffusion capacity   . Hypertension   . Hyperlipidemia     Past Surgical History  Procedure Laterality Date  . Tonsillectomy  1939  . Vesicovaginal fistula closure w/ tah  10/28/84    Social History  Substance Use Topics  . Smoking status: Former Smoker    Quit date: 11/28/1978  . Smokeless tobacco: Never Used  . Alcohol Use: No    Family History  Problem Relation Age of Onset  . Stroke Mother   . Hypertension Mother   . Deep vein thrombosis Father   . Clotting disorder Sister   . Heart disease Sister   . Heart failure Sister   . Clotting disorder Brother   . Heart disease Brother   . Heart failure Brother   . Colon cancer Neg Hx     Allergies  Allergen Reactions  . Penicillins Swelling    Medication list has been reviewed and updated.  Current Outpatient Prescriptions on File Prior to Visit  Medication Sig Dispense Refill  . atorvastatin (LIPITOR) 10 MG tablet Take 10 mg by mouth daily.      Marland Kitchen. PARoxetine (PAXIL) 20 MG tablet Take 20 mg by mouth every morning.       No current facility-administered medications on file prior to  visit.    ROS ROS otherwise unremarkable unless listed above.   Physical Examination: BP 126/82 mmHg  Pulse 91  Temp(Src) 98 F (36.7 C) (Oral)  Resp 17  Ht 5' 4.5" (1.638 m)  Wt 162 lb (73.483 kg)  BMI 27.39 kg/m2  SpO2 97% Ideal Body Weight: Weight in (lb) to have BMI = 25: 147.6  Physical Exam  Constitutional: She is oriented to person, place, and time. She appears well-developed and well-nourished. No distress.  HENT:  Head: Normocephalic and atraumatic.  Right Ear: External ear normal.  Left Ear: External ear normal.  Eyes: Conjunctivae and EOM are normal. Pupils are equal, round, and reactive to light.  Cardiovascular: Normal rate.   Pulmonary/Chest: Effort normal. No respiratory distress.  Neurological: She is alert and oriented to person, place, and time.  Skin: She is not diaphoretic.  Sutures are intact without dehiscence.  Skin is fragile and thin, due to age.   No tenderness at the laceration site.  Erythema and ecchymosis localized adjacently proximal to the hand.  Psychiatric: She has a normal mood and affect. Her behavior is normal.     Assessment and Plan: Cheryl MeekerJean S Muns is a 81 y.o.  female who is here today for suture removal.  Sutures were removed with little difficulty.  Lidocaine cream applied at patient's request and concern of pain.   Advised alarming sxs to warrant immediate return.  After care discussed.  Encounter for removal of sutures   Trena Platt, PA-C [facility] 11/01/2015 1:02 PM

## 2015-11-02 NOTE — Progress Notes (Signed)
  Medical screening examination/treatment/procedure(s) were performed by non-physician practitioner and as supervising physician I was immediately available for consultation/collaboration.     

## 2016-02-14 ENCOUNTER — Observation Stay (HOSPITAL_COMMUNITY)
Admission: EM | Admit: 2016-02-14 | Discharge: 2016-02-16 | Disposition: A | Discharge status: Home / Self Care | Payer: Medicare Other | Attending: Internal Medicine | Admitting: Internal Medicine

## 2016-02-14 ENCOUNTER — Encounter (HOSPITAL_COMMUNITY): Payer: Self-pay | Admitting: Emergency Medicine

## 2016-02-14 DIAGNOSIS — R197 Diarrhea, unspecified: Secondary | ICD-10-CM | POA: Insufficient documentation

## 2016-02-14 DIAGNOSIS — A0811 Acute gastroenteropathy due to Norwalk agent: Secondary | ICD-10-CM

## 2016-02-14 DIAGNOSIS — E785 Hyperlipidemia, unspecified: Secondary | ICD-10-CM | POA: Diagnosis not present

## 2016-02-14 DIAGNOSIS — Z87891 Personal history of nicotine dependence: Secondary | ICD-10-CM | POA: Diagnosis not present

## 2016-02-14 DIAGNOSIS — Z880 Allergy status to penicillin: Secondary | ICD-10-CM | POA: Diagnosis not present

## 2016-02-14 DIAGNOSIS — Z79899 Other long term (current) drug therapy: Secondary | ICD-10-CM | POA: Diagnosis not present

## 2016-02-14 DIAGNOSIS — R112 Nausea with vomiting, unspecified: Principal | ICD-10-CM | POA: Diagnosis present

## 2016-02-14 DIAGNOSIS — I10 Essential (primary) hypertension: Secondary | ICD-10-CM | POA: Diagnosis present

## 2016-02-14 DIAGNOSIS — I480 Paroxysmal atrial fibrillation: Secondary | ICD-10-CM | POA: Diagnosis present

## 2016-02-14 DIAGNOSIS — E860 Dehydration: Secondary | ICD-10-CM

## 2016-02-14 DIAGNOSIS — R42 Dizziness and giddiness: Secondary | ICD-10-CM | POA: Insufficient documentation

## 2016-02-14 LAB — CBC WITH DIFFERENTIAL/PLATELET
BASOS ABS: 0 10*3/uL (ref 0.0–0.1)
BASOS PCT: 0 %
EOS ABS: 0.1 10*3/uL (ref 0.0–0.7)
EOS PCT: 1 %
HCT: 41.4 % (ref 36.0–46.0)
Hemoglobin: 13.5 g/dL (ref 12.0–15.0)
Lymphocytes Relative: 4 %
Lymphs Abs: 0.6 10*3/uL — ABNORMAL LOW (ref 0.7–4.0)
MCH: 30.8 pg (ref 26.0–34.0)
MCHC: 32.6 g/dL (ref 30.0–36.0)
MCV: 94.3 fL (ref 78.0–100.0)
MONO ABS: 1.1 10*3/uL — AB (ref 0.1–1.0)
Monocytes Relative: 7 %
Neutro Abs: 14.2 10*3/uL — ABNORMAL HIGH (ref 1.7–7.7)
Neutrophils Relative %: 88 %
PLATELETS: 208 10*3/uL (ref 150–400)
RBC: 4.39 MIL/uL (ref 3.87–5.11)
RDW: 13.5 % (ref 11.5–15.5)
WBC: 16.1 10*3/uL — ABNORMAL HIGH (ref 4.0–10.5)

## 2016-02-14 LAB — BASIC METABOLIC PANEL
ANION GAP: 8 (ref 5–15)
BUN: 21 mg/dL — ABNORMAL HIGH (ref 6–20)
CALCIUM: 9 mg/dL (ref 8.9–10.3)
CO2: 25 mmol/L (ref 22–32)
Chloride: 106 mmol/L (ref 101–111)
Creatinine, Ser: 1.15 mg/dL — ABNORMAL HIGH (ref 0.44–1.00)
GFR, EST AFRICAN AMERICAN: 50 mL/min — AB (ref >60)
GFR, EST NON AFRICAN AMERICAN: 43 mL/min — AB (ref >60)
Glucose, Bld: 159 mg/dL — ABNORMAL HIGH (ref 65–99)
POTASSIUM: 4.4 mmol/L (ref 3.5–5.1)
SODIUM: 139 mmol/L (ref 135–145)

## 2016-02-14 MED ORDER — SODIUM CHLORIDE 0.9 % IV BOLUS (SEPSIS)
1000.0000 mL | Freq: Once | INTRAVENOUS | Status: AC
  Administered 2016-02-14: 1000 mL via INTRAVENOUS

## 2016-02-14 MED ORDER — ONDANSETRON 4 MG PO TBDP: 4.0000 mg | ORAL_TABLET | Freq: Once | ORAL | Status: DC

## 2016-02-14 MED ORDER — ONDANSETRON HCL 4 MG/2ML IJ SOLN
4.0000 mg | Freq: Once | INTRAMUSCULAR | Status: AC
  Administered 2016-02-14: 4 mg via INTRAVENOUS
  Filled 2016-02-14: qty 2

## 2016-02-14 NOTE — ED Notes (Signed)
Bed: WA12 Expected date:  Expected time:  Means of arrival:  Comments: EMS 

## 2016-02-14 NOTE — ED Provider Notes (Addendum)
CSN: [id number]     Arrival date & time 02/14/16  2208 History   First MD Initiated Contact with Patient 02/14/16 2228     Chief Complaint  Patient presents with  . Emesis  . Diarrhea    HPI Comments: 82 year old generally healthy female presents with acute onset of nausea, vomiting, and diarrhea since 8PM this evening. She states she ate at about 2pm at a [employer] and had a tossed salad. She subsequently had 3 episodes of vomiting and diarrhea. Denies blood in emesis or stool. She reports associated dizziness and weakness after each episode but denies LOC. Denies fever, URI symptoms, chest pain, SOB, cough, abdominal pain, dysuria. She was transported by EMS who gave her Zofran 4mg  IV which improved her symptoms.  The history is provided by the patient.    Past Medical History  Diagnosis Date  . Chest pain   . SOB (shortness of breath)   . Decreased diffusion capacity   . Hypertension   . Hyperlipidemia    Past Surgical History  Procedure Laterality Date  . Tonsillectomy  1939  . Vesicovaginal fistula closure w/ tah  10/28/84   Family History  Problem Relation Age of Onset  . Stroke Mother   . Hypertension Mother   . Deep vein thrombosis Father   . Clotting disorder Sister   . Heart disease Sister   . Heart failure Sister   . Clotting disorder Brother   . Heart disease Brother   . Heart failure Brother   . Colon cancer Neg Hx    Social History  Substance Use Topics  . Smoking status: Former Smoker    Quit date: 11/28/1978  . Smokeless tobacco: Never Used  . Alcohol Use: No   OB History    No data available     Review of Systems  Constitutional: Negative for fever and chills.  HENT: Negative for congestion.   Respiratory: Negative for chest tightness, shortness of breath and wheezing.   Cardiovascular: Negative for chest pain.  Gastrointestinal: Negative for nausea, vomiting, abdominal pain and diarrhea.  Genitourinary: Negative for dysuria.  Musculoskeletal:  Negative for myalgias.  Skin: Negative for rash.  Neurological: Positive for light-headedness.    Allergies  Penicillins  Home Medications   Prior to Admission medications   Medication Sig Start Date End Date Taking? Authorizing Provider  atorvastatin (LIPITOR) 10 MG tablet Take 10 mg by mouth daily.     Yes Historical Provider, MD  hydroxypropyl methylcellulose / hypromellose (ISOPTO TEARS / GONIOVISC) 2.5 % ophthalmic solution Place 1-2 drops into both eyes 3 (three) times daily as needed for dry eyes.   Yes Historical Provider, MD  PARoxetine (PAXIL) 20 MG tablet Take 20 mg by mouth every morning.     Yes Historical Provider, MD   BP 131/80 mmHg  Pulse 70  Temp(Src) 98 F (36.7 C) (Oral)  Resp 18  SpO2 98%   Physical Exam  Constitutional: She is oriented to person, place, and time. She appears well-developed and well-nourished. No distress.  Elderly female  HENT:  Head: Normocephalic and atraumatic.  Eyes: Conjunctivae are normal. Pupils are equal, round, and reactive to light. Right eye exhibits no discharge. Left eye exhibits no discharge. No scleral icterus.  Neck: Normal range of motion.  Cardiovascular: Normal rate and regular rhythm.  Exam reveals no gallop and no friction rub.   No murmur heard. Pulmonary/Chest: Effort normal and breath sounds normal. No respiratory distress. She has no wheezes. She has  no rales. She exhibits no tenderness.  Abdominal: Soft. Bowel sounds are normal. She exhibits no distension and no mass. There is tenderness. There is no rebound and no guarding.  Generalized tenderness  Neurological: She is alert and oriented to person, place, and time.  Skin: Skin is warm and dry.  Psychiatric: She has a normal mood and affect.    ED Course  Procedures (including critical care time) Labs Review Labs Reviewed  CBC WITH DIFFERENTIAL/PLATELET - Abnormal; Notable for the following:    WBC 16.1 (*)    Neutro Abs 14.2 (*)    Lymphs Abs 0.6 (*)     Monocytes Absolute 1.1 (*)    All other components within normal limits  BASIC METABOLIC PANEL - Abnormal; Notable for the following:    Glucose, Bld 159 (*)    BUN 21 (*)    Creatinine, Ser 1.15 (*)    GFR calc non Af Amer 43 (*)    GFR calc Af Amer 50 (*)    All other components within normal limits    Imaging Review No results found. I have personally reviewed and evaluated these images and lab results as part of my medical decision-making.   EKG Interpretation None      MDM   Final diagnoses:  Nausea vomiting and diarrhea   Pt is an 82 year old female who lives at home by herself presents with acute onset of N/V/D. Zofran given IV by EMS improved symptoms however 2nd dose did not improve symptoms here in the ED. No additional episodes of vomiting or diarrhea while here. Phenegan and 1L NS given. Pt is not orthostatic. Vital signs are stable, she is afebrile but has slightly elevated WBC. Shared visit with Dr. Estell Harpin who saw the patient and recommended Obs admission due to continued nausea and the fact that she lives alone. PA Browning placed consult to Hospitalist [facility].  Bethel Born, PA-C 02/15/16 0031  Bethel Born, PA-C 02/15/16 0032  Bethann Berkshire, MD 02/17/16 [address] PA-C 04/06/16 1445  Bethann Berkshire, MD 04/06/16 [phone]

## 2016-02-14 NOTE — ED Notes (Signed)
Brought in by EMS from home with c/o emesis and diarrhea.  Pt reports that she started having nausea since after she ate her lunch, and then tonight she started vomiting and also loose stool.  Pt denies abdominal pain.  Pt was given Zofran 4 mg IV en route to ED.

## 2016-02-15 ENCOUNTER — Observation Stay (HOSPITAL_COMMUNITY): Payer: Medicare Other

## 2016-02-15 ENCOUNTER — Encounter (HOSPITAL_COMMUNITY): Payer: Self-pay | Admitting: Family Medicine

## 2016-02-15 DIAGNOSIS — R112 Nausea with vomiting, unspecified: Secondary | ICD-10-CM | POA: Diagnosis not present

## 2016-02-15 DIAGNOSIS — N183 Chronic kidney disease, stage 3 unspecified: Secondary | ICD-10-CM | POA: Insufficient documentation

## 2016-02-15 DIAGNOSIS — I10 Essential (primary) hypertension: Secondary | ICD-10-CM | POA: Diagnosis not present

## 2016-02-15 DIAGNOSIS — R1110 Vomiting, unspecified: Secondary | ICD-10-CM | POA: Diagnosis not present

## 2016-02-15 DIAGNOSIS — N1831 Chronic kidney disease, stage 3a: Secondary | ICD-10-CM | POA: Insufficient documentation

## 2016-02-15 DIAGNOSIS — R197 Diarrhea, unspecified: Secondary | ICD-10-CM

## 2016-02-15 LAB — URINE MICROSCOPIC-ADD ON

## 2016-02-15 LAB — GASTROINTESTINAL PANEL BY PCR, STOOL (REPLACES STOOL CULTURE)
ADENOVIRUS F40/41: NOT DETECTED
ASTROVIRUS: NOT DETECTED
CAMPYLOBACTER SPECIES: NOT DETECTED
Cryptosporidium: NOT DETECTED
Cyclospora cayetanensis: NOT DETECTED
E. coli O157: NOT DETECTED
ENTEROAGGREGATIVE E COLI (EAEC): NOT DETECTED
ENTEROPATHOGENIC E COLI (EPEC): NOT DETECTED
Entamoeba histolytica: NOT DETECTED
Enterotoxigenic E coli (ETEC): NOT DETECTED
GIARDIA LAMBLIA: NOT DETECTED
NOROVIRUS GI/GII: DETECTED — AB
PLESIMONAS SHIGELLOIDES: NOT DETECTED
Rotavirus A: NOT DETECTED
Salmonella species: NOT DETECTED
Sapovirus (I, II, IV, and V): NOT DETECTED
Shiga like toxin producing E coli (STEC): NOT DETECTED
Shigella/Enteroinvasive E coli (EIEC): NOT DETECTED
Vibrio cholerae: NOT DETECTED
Vibrio species: NOT DETECTED
YERSINIA ENTEROCOLITICA: NOT DETECTED

## 2016-02-15 LAB — HEPATIC FUNCTION PANEL
ALT: 15 U/L (ref 14–54)
AST: 21 U/L (ref 15–41)
Albumin: 3.8 g/dL (ref 3.5–5.0)
Alkaline Phosphatase: 78 U/L (ref 38–126)
BILIRUBIN DIRECT: 0.2 mg/dL (ref 0.1–0.5)
BILIRUBIN INDIRECT: 0.4 mg/dL (ref 0.3–0.9)
Total Bilirubin: 0.6 mg/dL (ref 0.3–1.2)
Total Protein: 6.3 g/dL — ABNORMAL LOW (ref 6.5–8.1)

## 2016-02-15 LAB — BASIC METABOLIC PANEL
Anion gap: 6 (ref 5–15)
BUN: 19 mg/dL (ref 6–20)
CALCIUM: 7.9 mg/dL — AB (ref 8.9–10.3)
CO2: 23 mmol/L (ref 22–32)
CREATININE: 1.04 mg/dL — AB (ref 0.44–1.00)
Chloride: 111 mmol/L (ref 101–111)
GFR calc Af Amer: 56 mL/min — ABNORMAL LOW (ref >60)
GFR calc non Af Amer: 48 mL/min — ABNORMAL LOW (ref >60)
GLUCOSE: 146 mg/dL — AB (ref 65–99)
Potassium: 4.2 mmol/L (ref 3.5–5.1)
Sodium: 140 mmol/L (ref 135–145)

## 2016-02-15 LAB — LACTIC ACID, PLASMA
LACTIC ACID, VENOUS: 1.1 mmol/L (ref 0.5–2.0)
LACTIC ACID, VENOUS: 0.7 mmol/L (ref 0.5–2.0)

## 2016-02-15 LAB — URINALYSIS, ROUTINE W REFLEX MICROSCOPIC
BILIRUBIN URINE: NEGATIVE
Glucose, UA: NEGATIVE mg/dL
HGB URINE DIPSTICK: NEGATIVE
Ketones, ur: 15 mg/dL — AB
Nitrite: NEGATIVE
Protein, ur: NEGATIVE mg/dL
SPECIFIC GRAVITY, URINE: 1.02 (ref 1.005–1.030)
pH: 5.5 (ref 5.0–8.0)

## 2016-02-15 LAB — TROPONIN I
Troponin I: <0.03 ng/mL (ref <0.031)
Troponin I: <0.03 ng/mL (ref <0.031)

## 2016-02-15 LAB — LIPASE, BLOOD: LIPASE: 23 U/L (ref 11–51)

## 2016-02-15 LAB — PROCALCITONIN

## 2016-02-15 MED ORDER — ONDANSETRON HCL 4 MG PO TABS: 4.0000 mg | ORAL_TABLET | Freq: Four times a day (QID) | ORAL | Status: DC | PRN

## 2016-02-15 MED ORDER — ONDANSETRON HCL 4 MG/2ML IJ SOLN
4.0000 mg | Freq: Four times a day (QID) | INTRAMUSCULAR | Status: DC | PRN
Start: 2016-02-15 — End: 2016-02-16

## 2016-02-15 MED ORDER — ENOXAPARIN SODIUM 40 MG/0.4ML ~~LOC~~ SOLN
40.0000 mg | SUBCUTANEOUS | Status: DC
  Administered 2016-02-15: 40 mg via SUBCUTANEOUS
  Filled 2016-02-15 (×2): qty 0.4

## 2016-02-15 MED ORDER — POLYVINYL ALCOHOL 1.4 % OP SOLN
1.0000 [drp] | Freq: Three times a day (TID) | OPHTHALMIC | Status: DC | PRN
  Filled 2016-02-15: qty 15

## 2016-02-15 MED ORDER — HYDROCODONE-ACETAMINOPHEN 5-325 MG PO TABS: 1.0000 | ORAL_TABLET | ORAL | Status: DC | PRN

## 2016-02-15 MED ORDER — SODIUM CHLORIDE 0.9 % IV SOLN
INTRAVENOUS | Status: AC
  Filled 2016-02-15: qty 1000

## 2016-02-15 MED ORDER — FAMOTIDINE 20 MG PO TABS
20.0000 mg | ORAL_TABLET | Freq: Two times a day (BID) | ORAL | Status: DC
  Administered 2016-02-15 – 2016-02-16 (×3): 20 mg via ORAL
  Filled 2016-02-15 (×3): qty 1

## 2016-02-15 MED ORDER — ACETAMINOPHEN 650 MG RE SUPP: 650.0000 mg | Freq: Four times a day (QID) | RECTAL | Status: DC | PRN

## 2016-02-15 MED ORDER — MORPHINE SULFATE (PF) 2 MG/ML IV SOLN: 1.0000 mg | INTRAVENOUS | Status: DC | PRN

## 2016-02-15 MED ORDER — ACETAMINOPHEN 325 MG PO TABS: 650.0000 mg | ORAL_TABLET | Freq: Four times a day (QID) | ORAL | Status: DC | PRN

## 2016-02-15 MED ORDER — PROMETHAZINE HCL 25 MG PO TABS
12.5000 mg | ORAL_TABLET | Freq: Once | ORAL | Status: AC
  Administered 2016-02-15: 12.5 mg via ORAL
  Filled 2016-02-15: qty 1

## 2016-02-15 MED ORDER — ATORVASTATIN CALCIUM 10 MG PO TABS
10.0000 mg | ORAL_TABLET | Freq: Every day | ORAL | Status: DC
Start: 2016-02-15 — End: 2016-02-16
  Administered 2016-02-15: 10 mg via ORAL
  Filled 2016-02-15 (×2): qty 1

## 2016-02-15 MED ORDER — SODIUM CHLORIDE 0.9 % IV SOLN
INTRAVENOUS | Status: AC
  Administered 2016-02-15: 01:00:00 via INTRAVENOUS

## 2016-02-15 MED ORDER — PAROXETINE HCL 20 MG PO TABS
20.0000 mg | ORAL_TABLET | Freq: Every day | ORAL | Status: DC
  Administered 2016-02-15 – 2016-02-16 (×2): 20 mg via ORAL
  Filled 2016-02-15 (×3): qty 1

## 2016-02-15 MED ORDER — METOCLOPRAMIDE HCL 5 MG/ML IJ SOLN: 10.0000 mg | Freq: Once | INTRAMUSCULAR | Status: DC

## 2016-02-15 MED ORDER — SODIUM CHLORIDE 0.9 % IV SOLN: INTRAVENOUS | Status: DC

## 2016-02-15 NOTE — H&P (Signed)
[facility] History and Physical  Cheryl Peterson QJJ:[id number] DOB: 1930/05/21 DOA: 02/14/2016  Referring physician: ED physician PCP: Marton Redwood, MD  Specialists: Dr. Fuller Plan (GI), Dr. Acie Fredrickson (cardiology)   Chief Complaint:  Intractable nausea & vomiting, diarrhea  HPI: Cheryl Peterson is a 82 y.o. female with PMH of hypertension and hyperlipidemia who presents to the ED after development of nausea with vomiting and diarrhea that has worsened over the course of the night. Patient generally enjoys good health and was in her usual state until shortly after eating lunch on 02/14/2016 at [employer] where she had country chicken and tossed salad. Proximally 30 minutes after eating the meal, she developed nausea which has progressed become severe. Has been associated with multiple episodes of nonbloody, nonbilious vomiting and watery diarrhea. She denies any abdominal pain, fever, chills, melena, or hematochezia. There has been no sick contacts or long distance travel. She denies dysuria or flank pain. As her symptoms continued to worsen overnight, she activated EMS for transport to the hospital.  In ED, patient was found to be afebrile, saturating well on room air, and with vital signs stable. BMP features a serum creatinine of 1.15, corresponding to a eGFR of approximately 40, which is consistent with her baseline. CBC features a leukocytosis to 16,100. A 1 L normal saline bolus was administered. Patient was treated with 1 dose of Zofran and 1 dose of Phenergan and monitored in the emergency department but continued to experience nausea with vomiting and has been unable to tolerate any PO intake. She will be admitted under observation status for ongoing evaluation and management of acute onset intractable nausea, vomiting, and diarrhea.  Where does patient live?   At home    Can patient participate in ADLs?  Yes        Review of Systems:   General: no fevers, chills, sweats, weight change,  poor appetite, or fatigue HEENT: no blurry vision, hearing changes or sore throat Pulm: no dyspnea, cough, or wheeze CV: no chest pain or palpitations Abd: no abdominal pain or constipation. Nausea, vomiting, diarrhea GU: no dysuria, hematuria, increased urinary frequency, or urgency  Ext: no leg edema Neuro: no focal weakness, numbness, or tingling, no vision change or hearing loss Skin: no rash, no wounds MSK: No muscle spasm, no deformity, no red, hot, or swollen joint Heme: No easy bruising or bleeding Travel history: No recent long distant travel    Allergy:  Allergies  Allergen Reactions  . Penicillins Swelling    Has patient had a PCN reaction causing immediate rash, facial/tongue/throat swelling, SOB or lightheadedness with hypotension:No Has patient had a PCN reaction causing severe rash involving mucus membranes or skin necrosis:unsure Has patient had a PCN reaction that required hospitalization:Yes Has patient had a PCN reaction occurring within the last 10 years:No If all of the above answers are "NO", then may proceed with Cephalosporin use.     Past Medical History  Diagnosis Date  . Chest pain   . SOB (shortness of breath)   . Decreased diffusion capacity   . Hypertension   . Hyperlipidemia     Past Surgical History  Procedure Laterality Date  . Tonsillectomy  1939  . Vesicovaginal fistula closure w/ tah  10/28/84    Social History:  reports that she quit smoking about 37 years ago. She has never used smokeless tobacco. She reports that she does not drink alcohol or use illicit drugs.  Family History:  Family History  Problem Relation Age  of Onset  . Stroke Mother   . Hypertension Mother   . Deep vein thrombosis Father   . Clotting disorder Sister   . Heart disease Sister   . Heart failure Sister   . Clotting disorder Brother   . Heart disease Brother   . Heart failure Brother   . Colon cancer Neg Hx      Prior to Admission medications    Medication Sig Start Date End Date Taking? Authorizing Provider  atorvastatin (LIPITOR) 10 MG tablet Take 10 mg by mouth daily.     Yes Historical Provider, MD  hydroxypropyl methylcellulose / hypromellose (ISOPTO TEARS / GONIOVISC) 2.5 % ophthalmic solution Place 1-2 drops into both eyes 3 (three) times daily as needed for dry eyes.   Yes Historical Provider, MD  PARoxetine (PAXIL) 20 MG tablet Take 20 mg by mouth every morning.     Yes Historical Provider, MD    Physical Exam: Filed Vitals:   02/14/16 2330 02/14/16 2359 02/15/16 0000 02/15/16 0030  BP: 118/48  136/56 122/52  Pulse: 77  76 75  Temp:      TempSrc:      Resp:      SpO2: 88% 98% 97% 98%   General: Not in acute respiratory distress, but obvious discomfort HEENT:       Eyes: PERRL, EOMI, no scleral icterus or conjunctival pallor.       ENT: No discharge from the ears or nose, no pharyngeal ulcers, oral mucosa dry.        Neck: No JVD, no bruit, no appreciable mass Heme: No cervical adenopathy, no pallor Cardiac: S1/S2, RRR, No murmurs, No gallops or rubs. Pulm: Good air movement bilaterally. No rales, wheezing, rhonchi or rubs. Abd: Soft, nondistended, nontender, no rebound pain or gaurding, no mass or organomegaly, BS hypoactive. Ext: Trace LE edema bilaterally. 2+DP/PT pulse bilaterally. Musculoskeletal: No gross deformity, no red, hot, swollen joints, no limitation in ROM  Skin: No rashes or wounds on exposed surfaces. Poor turgor Neuro: Alert, oriented X3, cranial nerves II-XII grossly intact. No focal findings Psych: Patient is not overtly psychotic, appropriate mood and affect.  Labs on Admission:  Basic Metabolic Panel:  Recent Labs Lab 02/14/16 2301  NA 139  K 4.4  CL 106  CO2 25  GLUCOSE 159*  BUN 21*  CREATININE 1.15*  CALCIUM 9.0   Liver Function Tests: No results for input(s): AST, ALT, ALKPHOS, BILITOT, PROT, ALBUMIN in the last 168 hours. No results for input(s): LIPASE, AMYLASE in the last  168 hours. No results for input(s): AMMONIA in the last 168 hours. CBC:  Recent Labs Lab 02/14/16 2301  WBC 16.1*  NEUTROABS 14.2*  HGB 13.5  HCT 41.4  MCV 94.3  PLT 208   Cardiac Enzymes: No results for input(s): CKTOTAL, CKMB, CKMBINDEX, TROPONINI in the last 168 hours.  BNP (last 3 results) No results for input(s): BNP in the last 8760 hours.  ProBNP (last 3 results) No results for input(s): PROBNP in the last 8760 hours.  CBG: No results for input(s): GLUCAP in the last 168 hours.  Radiological Exams on Admission: No results found.  EKG: Ordered and pending    Assessment/Plan  1. Intractable nausea & vomiting, diarrhea  - No sick contacts or recent travel  - No recent abx use; no dysuria, hematuria, or flank pain  - Has not responded to Zofran and Phenergan in ED, not tolerating sips  - Doubt bacterial etiology without systemic infectious signs, bloody diarrhea,  or typical risk factors - SBO unlikely with diarrhea earlier in day, but has just been vomiting here and BS are difficult to appreciate on auscultation; with hx of abdominal surgery, will check a KUB  - GI pathogen panel ordered, enteric precautions  - Hydrate with NS at 100 cc/hr overnight  - Check hepatic function panel as only BMET obtained in ED; check UA, lipase   2. CKD stage III  - SCr 1.15 on admission, which appears to be her baseline  - Hydrating with NS overnight  - Avoiding nephrotoxins where possible   3. Hypertension - Has reported hx of such, but not currently on antihypertensive medications and diastolic pressure is on low side  - Hydrating with NS, monitoring    DVT ppx:  SQ Lovenox     Code Status: Full code Family Communication: None at bed side.                Disposition Plan: Admit to inpatient   Date of Service 02/15/2016    Vianne Bulls, MD [facility] Pager [phone]  If 7PM-7AM, please contact night-coverage [email or web address] Password [email or web address] 02/15/2016, 1:01  AM

## 2016-02-15 NOTE — Progress Notes (Signed)
PROGRESS NOTE  Cheryl Peterson ZOX:[id number] DOB: 09-19-1930 DOA: 02/14/2016 PCP: Martha Clan, MD Brief History 82 year-old female with a history of hypertension (not on meds) and hyperlipidemia presented with one-day history of nausea, vomiting, and diarrhea. The patient ate lunch at [employer] on 02/14/2016. Around approximately 7 PM, the patient began having intractable nausea, vomiting, and diarrhea. She denied any hematemesis, hematochezia, melena, fevers, chills. She did have some crampy abdominal pain. No one else with whom she ate at [employer] got sick. She denies any recent travels or sick contacts. She denies any dysuria, back pain, flank pain, rashes, synovitis. She denied any chest discomfort, shortness breath, dizziness, syncope. In emergency department, the patient was afebrile and hemodynamically stable. Hepatic enzymes and lipase were unremarkable, and lactic acid was 1.1. WBC was 16.1.  Assessment/Plan: Intractable nausea, vomiting, diarrhea -No sick contacts or recent travel -No recent antibiotic use, dysuria, hematuria -Suspect underlying viral gastroenteritis -Certainly may have been related to her recent visit to [employer] -KUB negative for obstruction or ileus; abdominal exam benign -Continue IV fluids -Stool for GI pathogen panel -Stool lactoferrin -Hepatic enzymes and lipase unremarkable -UA with 6-30 WBC--> add urine culture -pepcid to help with component of gastritis CKD stage III  -Baseline creatinine 1.0-1.1 Hyperglycemia -Check hemoglobin A1c Hyperlipidemia -Continue Lipitor Hypertension -Blood pressure controlled without medications  Family Communication:   No family at beside Disposition Plan:   Home 3/21 if stable       Procedures/Studies: Abd 1 View (kub)  02/15/2016  CLINICAL DATA:  Nausea vomiting and diarrhea, acute onset at 20:00. EXAM: ABDOMEN - 1 VIEW COMPARISON:  CT 08/29/2011 FINDINGS: There is moderate gaseous distention of the stomach. No  evidence of small bowel obstruction. No extraluminal air. No biliary or urinary calculi are evident. IMPRESSION: Gaseous distention of the stomach. Electronically Signed   By: Ellery Plunk M.D.   On: 02/15/2016 01:28         Subjective: Patient denies fevers, chills, headache, chest pain, dyspnea, nausea, vomiting, diarrhea, dysuria, hematuria. She has some crampy abdominal pain   Objective: Filed Vitals:   02/15/16 0600 02/15/16 0630 02/15/16 0700 02/15/16 0730  BP: 117/58 114/53 117/52 126/60  Pulse: 78 73 73 80  Temp:      TempSrc:      Resp: SpO2: 96% 96% 94% 97%    Intake/Output Summary (Last 24 hours) at 02/15/16 0914 Last data filed at 02/14/16 2354  Gross per 24 hour  Intake   1000 ml  Output      0 ml  Net   1000 ml   Weight change:  Exam:   General:  Pt is alert, follows commands appropriately, not in acute distress  HEENT: No icterus, No thrush, No neck mass, [place]/AT  Cardiovascular: RRR, S1/S2, no rubs, no gallops  Respiratory: CTA bilaterally, no wheezing, no crackles, no rhonchi  Abdomen: Soft/+BS, non tender, non distended, no guarding  Extremities: No edema, No lymphangitis, No petechiae, No rashes, no synovitis  Data Reviewed: Basic Metabolic Panel:  Recent Labs Lab 02/14/16 2301 02/15/16 0635  NA 139 140  K 4.4 4.2  CL 106 111  CO2 25 23  GLUCOSE 159* 146*  BUN 21* 19  CREATININE 1.15* 1.04*  CALCIUM 9.0 7.9*   Liver Function Tests:  Recent Labs Lab 02/15/16 0119  AST 21  ALT 15  ALKPHOS 78  BILITOT 0.6  PROT 6.3*  ALBUMIN 3.8    Recent Labs Lab 02/15/16 0119  LIPASE 23   No results for input(s): AMMONIA in the last 168 hours. CBC:  Recent Labs Lab 02/14/16 2301  WBC 16.1*  NEUTROABS 14.2*  HGB 13.5  HCT 41.4  MCV 94.3  PLT 208   Cardiac Enzymes:  Recent Labs Lab 02/15/16 0119 02/15/16 0635  TROPONINI <0.03 <0.03   BNP: Invalid input(s): POCBNP CBG: No results for input(s): GLUCAP  in the last 168 hours.  No results found for this or any previous visit (from the past 240 hour(s)).   Scheduled Meds: . atorvastatin  10 mg Oral q1800  . enoxaparin (LOVENOX) injection  40 mg Subcutaneous Q24H  . metoCLOPramide (REGLAN) injection  10 mg Intravenous Once  . PARoxetine  20 mg Oral Daily   Continuous Infusions: . sodium chloride 100 mL/hr at 02/15/16 0129     Eustolia Drennen, DO  [facility] Pager [phone]  If 7PM-7AM, please contact night-coverage [email or web address] Password [email or web address] 02/15/2016, 9:14 AM

## 2016-02-15 NOTE — Progress Notes (Signed)
CRITICAL VALUE ALERT  Critical value received:  Positive norovirus   Date of notification:  02-15-16  Time of notification:  1701  Critical value read back:yes  Nurse who received alert:  Priscella MannHeather Timmya Blazier, RN   MD notified (1st page):  Dr. Arbutus Leasat   Time of first page:  17:05 (MD on unit)   MD notified (2nd page):  Time of second page:  Responding MD:  Dr. Arbutus Leasat   Time MD responded:  [phone]

## 2016-02-16 DIAGNOSIS — E860 Dehydration: Secondary | ICD-10-CM | POA: Diagnosis not present

## 2016-02-16 DIAGNOSIS — A0811 Acute gastroenteropathy due to Norwalk agent: Secondary | ICD-10-CM

## 2016-02-16 DIAGNOSIS — I10 Essential (primary) hypertension: Secondary | ICD-10-CM | POA: Diagnosis not present

## 2016-02-16 DIAGNOSIS — R112 Nausea with vomiting, unspecified: Secondary | ICD-10-CM | POA: Diagnosis not present

## 2016-02-16 LAB — URINE CULTURE

## 2016-02-16 LAB — BASIC METABOLIC PANEL
Anion gap: 4 — ABNORMAL LOW (ref 5–15)
BUN: 12 mg/dL (ref 6–20)
CALCIUM: 7.8 mg/dL — AB (ref 8.9–10.3)
CO2: 23 mmol/L (ref 22–32)
Chloride: 114 mmol/L — ABNORMAL HIGH (ref 101–111)
Creatinine, Ser: 1.07 mg/dL — ABNORMAL HIGH (ref 0.44–1.00)
GFR calc Af Amer: 54 mL/min — ABNORMAL LOW (ref >60)
GFR, EST NON AFRICAN AMERICAN: 47 mL/min — AB (ref >60)
GLUCOSE: 100 mg/dL — AB (ref 65–99)
Potassium: 4.2 mmol/L (ref 3.5–5.1)
SODIUM: 141 mmol/L (ref 135–145)

## 2016-02-16 LAB — FECAL LACTOFERRIN, QUANT: FECAL LACTOFERRIN: POSITIVE

## 2016-02-16 LAB — CBC
HCT: 31.6 % — ABNORMAL LOW (ref 36.0–46.0)
Hemoglobin: 10.2 g/dL — ABNORMAL LOW (ref 12.0–15.0)
MCH: 29.6 pg (ref 26.0–34.0)
MCHC: 32.3 g/dL (ref 30.0–36.0)
MCV: 91.6 fL (ref 78.0–100.0)
PLATELETS: 155 10*3/uL (ref 150–400)
RBC: 3.45 MIL/uL — ABNORMAL LOW (ref 3.87–5.11)
RDW: 13.8 % (ref 11.5–15.5)
WBC: 4.7 10*3/uL (ref 4.0–10.5)

## 2016-02-16 LAB — MAGNESIUM: MAGNESIUM: 1.6 mg/dL — AB (ref 1.7–2.4)

## 2016-02-16 MED ORDER — MAGNESIUM SULFATE 2 GM/50ML IV SOLN
2.0000 g | Freq: Once | INTRAVENOUS | Status: AC
  Administered 2016-02-16: 2 g via INTRAVENOUS
  Filled 2016-02-16: qty 50

## 2016-02-16 NOTE — Discharge Summary (Signed)
Physician Discharge Summary  Cheryl Peterson ZOX:[id number] DOB: 1930-02-09 DOA: 02/14/2016  PCP: Martha Clan, MD  Admit date: 02/14/2016 Discharge date: 02/16/2016  Recommendations for Outpatient Follow-up:  1. Pt will need to follow up with PCP in 1-2 weeks post discharge 2. Please obtain BMP in one week  Discharge Diagnoses:  Intractable nausea, vomiting, diarrhea--Norovirus gastroenteritis -No sick contacts or recent travel -No recent antibiotic use, dysuria, hematuria -Certainly may have been related to her recent visit to [employer] -KUB negative for obstruction or ileus; abdominal exam benign -Continued IV fluids -Stool for GI pathogen panel--+norovirus -Stool lactoferrin--pending -Hepatic enzymes and lipase unremarkable -UA with 6-30 WBC--> add urine culture-->will not start abx as pt is asymptomatic and clinically improved -pepcid to help with component of gastritis -diet advanced and pt tolerated -diarrhea improved CKD stage III  -Baseline creatinine 1.0-1.1 Hypomagnesemia -Repleted Hyperglycemia -Check hemoglobin A1c--pending Hyperlipidemia -Continue Lipitor Hypertension -Blood pressure controlled without medications  Discharge Condition: stable  Disposition: home  Diet:low residue Wt Readings from Last 3 Encounters:  10/29/15 73.483 kg (162 lb)  10/19/15 73.12 kg (161 lb 3.2 oz)  06/11/13 72.576 kg (160 lb)    History of present illness:  82 year-old female with a history of hypertension (not on meds) and hyperlipidemia presented with one-day history of nausea, vomiting, and diarrhea. The patient ate lunch at [employer] on 02/14/2016. Around approximately 7 PM, the patient began having intractable nausea, vomiting, and diarrhea. She denied any hematemesis, hematochezia, melena, fevers, chills. She did have some crampy abdominal pain. No one else with whom she ate at [employer] got sick. She denies any recent travels or sick contacts. She denies any dysuria, back pain, flank  pain, rashes, synovitis. She denied any chest discomfort, shortness breath, dizziness, syncope. In emergency department, the patient was afebrile and hemodynamically stable. Hepatic enzymes and lipase were unremarkable, and lactic acid was 1.1. WBC was 16.1. The patient initially was treated with bowel rest and made nothing by mouth. Her diet was gradually advanced when she began improving. She was started on intravenous fluids. She tolerated her diet without any further vomiting. Her diarrhea gradually improved. WBCs improved. The patient was discharged in stable condition.  Discharge Exam: Filed Vitals:   02/15/16 2049 02/16/16 0555  BP: 111/59 114/55  Pulse: 71 73  Temp: 98.4 F (36.9 C) 97.9 F (36.6 C)  Resp: 16 14   Filed Vitals:   02/15/16 0947 02/15/16 1402 02/15/16 2049 02/16/16 0555  BP: 115/55 118/64 111/59 114/55  Pulse: 81 78 71 73  Temp: 98 F (36.7 C) 97.9 F (36.6 C) 98.4 F (36.9 C) 97.9 F (36.6 C)  TempSrc: Oral Oral Oral Oral  Resp: SpO2: 93% 95% 94% 94%   General: A&O x 3, NAD, pleasant, cooperative Cardiovascular: RRR, no rub, no gallop, no S3 Respiratory: CTAB, no wheeze, no rhonchi Abdomen:soft, nontender, nondistended, positive bowel sounds Extremities: No edema, No lymphangitis, no petechiae  Discharge Instructions      Discharge Instructions    Diet - low sodium heart healthy    Complete by:  As directed      Increase activity slowly    Complete by:  As directed             Medication List    TAKE these medications        atorvastatin 10 MG tablet  Commonly known as:  LIPITOR  Take 10 mg by mouth daily.     hydroxypropyl methylcellulose /  hypromellose 2.5 % ophthalmic solution  Commonly known as:  ISOPTO TEARS / GONIOVISC  Place 1-2 drops into both eyes 3 (three) times daily as needed for dry eyes.     PARoxetine 20 MG tablet  Commonly known as:  PAXIL  Take 20 mg by mouth every morning.         The results of  significant diagnostics from this hospitalization (including imaging, microbiology, ancillary and laboratory) are listed below for reference.    Significant Diagnostic Studies: Abd 1 View (kub)  02/15/2016  CLINICAL DATA:  Nausea vomiting and diarrhea, acute onset at 20:00. EXAM: ABDOMEN - 1 VIEW COMPARISON:  CT 08/29/2011 FINDINGS: There is moderate gaseous distention of the stomach. No evidence of small bowel obstruction. No extraluminal air. No biliary or urinary calculi are evident. IMPRESSION: Gaseous distention of the stomach. Electronically Signed   By: Ellery Plunk M.D.   On: 02/15/2016 01:28     Microbiology: Recent Results (from the past 240 hour(s))  Gastrointestinal Panel by PCR , Stool     Status: Abnormal   Collection Time: 02/15/16 12:01 PM  Result Value Ref Range Status   Campylobacter species NOT DETECTED NOT DETECTED Final   Plesimonas shigelloides NOT DETECTED NOT DETECTED Final   Salmonella species NOT DETECTED NOT DETECTED Final   Yersinia enterocolitica NOT DETECTED NOT DETECTED Final   Vibrio species NOT DETECTED NOT DETECTED Final   Vibrio cholerae NOT DETECTED NOT DETECTED Final   Enteroaggregative E coli (EAEC) NOT DETECTED NOT DETECTED Final   Enteropathogenic E coli (EPEC) NOT DETECTED NOT DETECTED Final   Enterotoxigenic E coli (ETEC) NOT DETECTED NOT DETECTED Final   Shiga like toxin producing E coli (STEC) NOT DETECTED NOT DETECTED Final   E. coli O157 NOT DETECTED NOT DETECTED Final   Shigella/Enteroinvasive E coli (EIEC) NOT DETECTED NOT DETECTED Final   Cryptosporidium NOT DETECTED NOT DETECTED Final   Cyclospora cayetanensis NOT DETECTED NOT DETECTED Final   Entamoeba histolytica NOT DETECTED NOT DETECTED Final   Giardia lamblia NOT DETECTED NOT DETECTED Final   Adenovirus F40/41 NOT DETECTED NOT DETECTED Final   Astrovirus NOT DETECTED NOT DETECTED Final   Norovirus GI/GII DETECTED (A) NOT DETECTED Final    Comment: CRITICAL RESULT CALLED TO,  READ BACK BY AND VERIFIED WITH: NICOLE MCCOY ON 02/15/16 AT 0547 [facility] CRITICAL RESULT CALLED TO, READ BACK BY AND VERIFIED WITH: BULLINS,H. RN  ON 3.20.17 BY MCCOY,N.     Rotavirus A NOT DETECTED NOT DETECTED Final   Sapovirus (I, II, IV, and V) NOT DETECTED NOT DETECTED Final     Labs: Basic Metabolic Panel:  Recent Labs Lab 02/14/16 2301 02/15/16 0635 02/16/16 0358  NA 139 140 141  K 4.4 4.2 4.2  CL 106 111 114*  CO2 GLUCOSE 159* 146* 100*  BUN 21* 19 12  CREATININE 1.15* 1.04* 1.07*  CALCIUM 9.0 7.9* 7.8*  MG  --   --  1.6*   Liver Function Tests:  Recent Labs Lab 02/15/16 0119  AST 21  ALT 15  ALKPHOS 78  BILITOT 0.6  PROT 6.3*  ALBUMIN 3.8    Recent Labs Lab 02/15/16 0119  LIPASE 23   No results for input(s): AMMONIA in the last 168 hours. CBC:  Recent Labs Lab 02/14/16 2301 02/16/16 0358  WBC 16.1* 4.7  NEUTROABS 14.2*  --   HGB 13.5 10.2*  HCT 41.4 31.6*  MCV 94.3 91.6  PLT 208 155   Cardiac Enzymes:  Recent Labs Lab 02/15/16 0119 02/15/16 0635 02/15/16 1359  TROPONINI <0.03 <0.03 <0.03   BNP: Invalid input(s): POCBNP CBG: No results for input(s): GLUCAP in the last 168 hours.  Time coordinating discharge:  Greater than 30 minutes  Signed:  Eleuterio Dollar, DO [facility] Pager: [phone] 02/16/2016, 10:14 AM

## 2016-02-16 NOTE — Progress Notes (Signed)
Discharge instructions reviewed with patient, questions answered, verbalized understanding.  Patient transported to front of hospital via wheelchair accompanied by RN to be taken home by cab.

## 2016-02-17 LAB — HEMOGLOBIN A1C
Hgb A1c MFr Bld: 6.2 % — ABNORMAL HIGH (ref 4.8–5.6)
MEAN PLASMA GLUCOSE: 131 mg/dL

## 2016-12-27 ENCOUNTER — Ambulatory Visit: Payer: Medicare Other | Admitting: Sports Medicine

## 2017-05-01 ENCOUNTER — Emergency Department (HOSPITAL_COMMUNITY)
Admission: EM | Admit: 2017-05-01 | Discharge: 2017-05-02 | Disposition: A | Discharge status: Home / Self Care | Payer: Medicare Other | Attending: Emergency Medicine | Admitting: Emergency Medicine

## 2017-05-01 DIAGNOSIS — Z87891 Personal history of nicotine dependence: Secondary | ICD-10-CM | POA: Diagnosis not present

## 2017-05-01 DIAGNOSIS — N183 Chronic kidney disease, stage 3 (moderate): Secondary | ICD-10-CM | POA: Diagnosis not present

## 2017-05-01 DIAGNOSIS — Z79899 Other long term (current) drug therapy: Secondary | ICD-10-CM | POA: Insufficient documentation

## 2017-05-01 DIAGNOSIS — R42 Dizziness and giddiness: Secondary | ICD-10-CM

## 2017-05-01 DIAGNOSIS — I129 Hypertensive chronic kidney disease with stage 1 through stage 4 chronic kidney disease, or unspecified chronic kidney disease: Secondary | ICD-10-CM | POA: Diagnosis not present

## 2017-05-01 DIAGNOSIS — H81399 Other peripheral vertigo, unspecified ear: Secondary | ICD-10-CM | POA: Diagnosis not present

## 2017-05-01 LAB — CBC
HCT: 37.3 % (ref 36.0–46.0)
Hemoglobin: 12.4 g/dL (ref 12.0–15.0)
MCH: 30.7 pg (ref 26.0–34.0)
MCHC: 33.2 g/dL (ref 30.0–36.0)
MCV: 92.3 fL (ref 78.0–100.0)
PLATELETS: 197 10*3/uL (ref 150–400)
RBC: 4.04 MIL/uL (ref 3.87–5.11)
RDW: 13.4 % (ref 11.5–15.5)
WBC: 7 10*3/uL (ref 4.0–10.5)

## 2017-05-01 LAB — CBG MONITORING, ED: GLUCOSE-CAPILLARY: 134 mg/dL — AB (ref 65–99)

## 2017-05-01 MED ORDER — ONDANSETRON HCL 4 MG/2ML IJ SOLN
4.0000 mg | Freq: Once | INTRAMUSCULAR | Status: AC
  Administered 2017-05-02: 4 mg via INTRAVENOUS
  Filled 2017-05-01: qty 2

## 2017-05-01 MED ORDER — MECLIZINE HCL 25 MG PO TABS
25.0000 mg | ORAL_TABLET | Freq: Once | ORAL | Status: AC
  Administered 2017-05-02: 25 mg via ORAL
  Filled 2017-05-01: qty 1

## 2017-05-01 NOTE — ED Triage Notes (Signed)
Pt from home via [facility]. C/o weakness & dizziness that started around 1930 today. Sts it's only an issue when she tries to get up and move around. Endorses 2 episodes of vomiting in the past 24hrs and is currently still nauseous. Denies any pain @ this time. A&O x4 on arrival.

## 2017-05-01 NOTE — ED Provider Notes (Signed)
[facility]-EMERGENCY DEPT Provider Note   CSN: [id number] Arrival date & time: 05/01/17  2304     History   Chief Complaint No chief complaint on file.   HPI Cheryl Peterson is a 83 y.o. female.  HPI   83 year old female with history of paroxysmal A. fib  currently not on any anticoagulant, CKD, presenting for evaluation of dizziness. Patient states she was offered normal self up until 7:30 PM this evening when she was getting up from her chair in the living room to go to bed when she felt dizzy and described as "swimmy headed" having trouble with her balance, and tremulous to both hands and feet.  Symptoms worsen with positional change. Patient tried to drink Slim fast but her symptoms persist,. She was at home by herself and was concerned therefore EMS was contacted. When EMS arrived, patient was too dizzy to walk to the door, EMS has to use an outside door key to enter the house. Currently, while resting, patient states she feels fine however with positional change, symptoms returned. She also endorsed feeling nauseous, and has vomited once. She denies any associated headache, fever, chills, neck pain, chest pain, shortness of breath, abdominal pain, back pain, dysuria, focal numbness or focal weakness. She denies mental confusion, or trouble with her speech. Denies any vision changes. Patient states for the past 3 weeks she has attempted to change her diet to lose weight. States that she has loss 2-3 pounds. She denies any prior history of stroke. She was a former smoker. Denies any recent medication changes.  Past Medical History:  Diagnosis Date  . Chest pain   . Decreased diffusion capacity   . Hyperlipidemia   . Hypertension   . SOB (shortness of breath)     Patient Active Problem List   Diagnosis Date Noted  . Gastroenteritis due to norovirus 02/16/2016  . Dehydration 02/16/2016  . Nausea vomiting and diarrhea 02/15/2016  . Hypertension 02/15/2016  . Intractable vomiting with  nausea 02/15/2016  . CKD (chronic kidney disease), stage III   . Chest pain   . SOB (shortness of breath)   . Hyperlipidemia   . PAF (paroxysmal atrial fibrillation) (HCC)   . Varicose veins   . Decreased diffusion capacity     Past Surgical History:  Procedure Laterality Date  . TONSILLECTOMY  1939  . VESICOVAGINAL FISTULA CLOSURE W/ TAH  10/28/84    OB History    No data available       Home Medications    Prior to Admission medications   Medication Sig Start Date End Date Taking? Authorizing Provider  atorvastatin (LIPITOR) 10 MG tablet Take 10 mg by mouth daily.      [provider]  hydroxypropyl methylcellulose / hypromellose (ISOPTO TEARS / GONIOVISC) 2.5 % ophthalmic solution Place 1-2 drops into both eyes 3 (three) times daily as needed for dry eyes.    [provider]  PARoxetine (PAXIL) 20 MG tablet Take 20 mg by mouth every morning.      [provider]    Family History Family History  Problem Relation Age of Onset  . Stroke Mother   . Hypertension Mother   . Deep vein thrombosis Father   . Clotting disorder Sister   . Heart disease Sister   . Heart failure Sister   . Clotting disorder Brother   . Heart disease Brother   . Heart failure Brother   . Colon cancer Neg Hx     Social  History Social History  Substance Use Topics  . Smoking status: Former Smoker    Quit date: 11/28/1978  . Smokeless tobacco: Never Used  . Alcohol use No     Allergies   Penicillins   Review of Systems Review of Systems  All other systems reviewed and are negative.    Physical Exam Updated Vital Signs BP (!) 157/84 (BP Location: Right Arm)   Pulse 61   Temp 97.6 F (36.4 C)   Resp 14   Ht 5\' 4"  (1.626 m)   Wt 72.6 kg (160 lb)   SpO2 97%   BMI 27.46 kg/m   Physical Exam  Constitutional: She is oriented to person, place, and time. She appears well-developed and well-nourished. No distress.  HENT:  Head: Atraumatic.  Right  Ear: External ear normal.  Left Ear: External ear normal.  Eyes: Conjunctivae and EOM are normal. Pupils are equal, round, and reactive to light.  Neck: Normal range of motion. Neck supple. No JVD present.  No nuchal rigidity, no carotid bruit  Cardiovascular: Normal rate and regular rhythm.   Pulmonary/Chest: Effort normal and breath sounds normal.  Abdominal: Soft. There is no tenderness.  Neurological: She is alert and oriented to person, place, and time. She has normal strength. No cranial nerve deficit or sensory deficit. GCS eye subscore is 4. GCS verbal subscore is 5. GCS motor subscore is 6.  Alert and oriented, cranial nerves III-12 is grossly intact, normal finger to nose coordination. No pronator drift. Unable to stand, gait not tested.  Skin: No rash noted.  Psychiatric: She has a normal mood and affect.  Nursing note and vitals reviewed.    ED Treatments / Results  Labs (all labs ordered are listed, but only abnormal results are displayed) Labs Reviewed  BASIC METABOLIC PANEL - Abnormal; Notable for the following:       Result Value   Sodium 132 (*)    Chloride 100 (*)    CO2 21 (*)    Glucose, Bld 146 (*)    BUN 27 (*)    Creatinine, Ser 1.08 (*)    Calcium 8.7 (*)    GFR calc non Af Amer 46 (*)    GFR calc Af Amer 53 (*)    All other components within normal limits  URINALYSIS, ROUTINE W REFLEX MICROSCOPIC - Abnormal; Notable for the following:    Color, Urine STRAW (*)    Ketones, ur 5 (*)    Leukocytes, UA SMALL (*)    All other components within normal limits  CBG MONITORING, ED - Abnormal; Notable for the following:    Glucose-Capillary 134 (*)    All other components within normal limits  CBC    EKG  EKG Interpretation  Date/Time:  Monday May 01 2017 23:24:22 EDT Ventricular Rate:  62 PR Interval:    QRS Duration: 106 QT Interval:  460 QTC Calculation: 468 R Axis:   -9 Text Interpretation:  Sinus rhythm Atrial premature complex Low voltage,  precordial leads No significant change since last tracing Confirmed by Gilda CreasePollina, Christopher J [id number]) on 05/02/2017 12:34:04 AM     ED ECG REPORT   Date: 05/02/2017  Rate: 62  Rhythm: normal sinus rhythm  QRS Axis: normal  Intervals: normal  ST/T Wave abnormalities: normal  Conduction Disutrbances:PAC  Narrative Interpretation:   Old EKG Reviewed: unchanged  I have personally reviewed the EKG tracing and agree with the computerized printout as noted.   [facility]  Result Date: 05/02/2017 CLINICAL DATA:  Initial evaluation for acute vertigo. EXAM: MRI HEAD WITHOUT CONTRAST MRA HEAD WITHOUT CONTRAST MRA NECK WITHOUT AND WITH CONTRAST TECHNIQUE: Multiplanar, multiecho pulse sequences of the brain and surrounding structures were obtained without intravenous contrast. Angiographic images of the Circle of Willis were obtained using MRA technique without intravenous contrast. Angiographic images of the neck were obtained using MRA technique without and with intravenous contrast. Carotid stenosis measurements (when applicable) are obtained utilizing NASCET criteria, using the distal internal carotid diameter as the denominator. CONTRAST:  15mL MULTIHANCE GADOBENATE DIMEGLUMINE 529 MG/ML IV SOLN COMPARISON:  None. FINDINGS: MRI HEAD FINDINGS Generalized age-related cerebral atrophy with mild chronic small vessel ischemic disease. No evidence for acute intracranial infarct. Gray-white matter differentiation maintained. No areas of chronic infarction identified. No evidence for acute or chronic intracranial hemorrhage. No mass lesion, midline shift or mass effect. Ventricular prominence related to global parenchymal volume loss of hydrocephalus. No extra-axial fluid collection. Major dural sinuses are grossly patent. Pituitary gland and suprasellar region within normal limits. Midline structures intact and normal. Major intracranial vascular flow voids maintained. Craniocervical  junction within normal limits. Visualized upper cervical spine unremarkable. Bone marrow signal intensity within normal limits. No scalp soft tissue abnormality Globes and orbital soft tissues within normal limits. Patient status post lens extraction bilaterally. Paranasal sinuses are clear. Trace bilateral mastoid effusions noted. MRA HEAD FINDINGS ANTERIOR CIRCULATION: Visualized distal cervical segments of the internal carotid arteries are patent with antegrade flow. Petrous, cavernous, and supraclinoid segments patent without flow-limiting stenosis. A1 segments patent. Anterior communicating artery normal. Anterior cerebral arteries patent to their distal aspects. M1 segments patent without stenosis or occlusion. No proximal M2 occlusion. Distal MCA branches well opacified and symmetric. POSTERIOR CIRCULATION: Vertebral arteries widely patent to the vertebrobasilar junction. Right vertebral artery dominant. Posterior inferior cerebral arteries patent proximally. The basilar artery widely patent. Superior cerebellar is patent bilaterally. Left PCA arises from the basilar and is widely patent to its distal aspect. Fetal type right PCA supplied via a widely patent right posterior communicating artery. Right PCA also widely patent to its distal aspect. No aneurysm or vascular malformation. MRA NECK FINDINGS Visualized aortic arch of normal caliber with normal branch pattern. No flow-limiting stenosis about the origin of the great vessels. Visualized subclavian arteries widely patent. Right common and internal carotid arteries widely patent without stenosis or occlusion. Mild atheromatous irregularity about the right carotid bifurcation without flow-limiting stenosis. Left common and internal carotid arteries widely patent without stenosis or occlusion. Mild atheromatous irregularity about the left carotid bifurcation without flow-limiting stenosis. Both of the vertebral arteries arise from the subclavian arteries.  Right vertebral artery dominant. Origin of the vertebral arteries not well evaluated on this exam. Short-segment moderate right V1 stenosis. Vertebral artery is otherwise widely patent within the neck with antegrade flow. IMPRESSION: MRI HEAD IMPRESSION: 1. No acute intracranial process identified. 2. Mild age-related cerebral atrophy with chronic small vessel ischemic disease. MRA HEAD IMPRESSION: Normal intracranial MRA. MRA NECK IMPRESSION: 1. Single short-segment moderate right V1 stenosis. Vertebral arteries otherwise widely patent within the neck with antegrade flow. Right vertebral artery is dominant. 2. Widely patent carotid arteries bilaterally without high-grade or critical stenosis. Electronically Signed   By: Rise Mu M.D.   On: 05/02/2017 05:28   Mr Angiogram Neck W Or Wo Contrast  Result Date: 05/02/2017 CLINICAL DATA:  Initial evaluation for acute vertigo. EXAM: MRI HEAD WITHOUT CONTRAST MRA HEAD WITHOUT CONTRAST MRA NECK WITHOUT AND WITH CONTRAST TECHNIQUE:  Multiplanar, multiecho pulse sequences of the brain and surrounding structures were obtained without intravenous contrast. Angiographic images of the Circle of Willis were obtained using MRA technique without intravenous contrast. Angiographic images of the neck were obtained using MRA technique without and with intravenous contrast. Carotid stenosis measurements (when applicable) are obtained utilizing NASCET criteria, using the distal internal carotid diameter as the denominator. CONTRAST:  15mL MULTIHANCE GADOBENATE DIMEGLUMINE 529 MG/ML IV SOLN COMPARISON:  None. FINDINGS: MRI HEAD FINDINGS Generalized age-related cerebral atrophy with mild chronic small vessel ischemic disease. No evidence for acute intracranial infarct. Gray-white matter differentiation maintained. No areas of chronic infarction identified. No evidence for acute or chronic intracranial hemorrhage. No mass lesion, midline shift or mass effect. Ventricular  prominence related to global parenchymal volume loss of hydrocephalus. No extra-axial fluid collection. Major dural sinuses are grossly patent. Pituitary gland and suprasellar region within normal limits. Midline structures intact and normal. Major intracranial vascular flow voids maintained. Craniocervical junction within normal limits. Visualized upper cervical spine unremarkable. Bone marrow signal intensity within normal limits. No scalp soft tissue abnormality Globes and orbital soft tissues within normal limits. Patient status post lens extraction bilaterally. Paranasal sinuses are clear. Trace bilateral mastoid effusions noted. MRA HEAD FINDINGS ANTERIOR CIRCULATION: Visualized distal cervical segments of the internal carotid arteries are patent with antegrade flow. Petrous, cavernous, and supraclinoid segments patent without flow-limiting stenosis. A1 segments patent. Anterior communicating artery normal. Anterior cerebral arteries patent to their distal aspects. M1 segments patent without stenosis or occlusion. No proximal M2 occlusion. Distal MCA branches well opacified and symmetric. POSTERIOR CIRCULATION: Vertebral arteries widely patent to the vertebrobasilar junction. Right vertebral artery dominant. Posterior inferior cerebral arteries patent proximally. The basilar artery widely patent. Superior cerebellar is patent bilaterally. Left PCA arises from the basilar and is widely patent to its distal aspect. Fetal type right PCA supplied via a widely patent right posterior communicating artery. Right PCA also widely patent to its distal aspect. No aneurysm or vascular malformation. MRA NECK FINDINGS Visualized aortic arch of normal caliber with normal branch pattern. No flow-limiting stenosis about the origin of the great vessels. Visualized subclavian arteries widely patent. Right common and internal carotid arteries widely patent without stenosis or occlusion. Mild atheromatous irregularity about the  right carotid bifurcation without flow-limiting stenosis. Left common and internal carotid arteries widely patent without stenosis or occlusion. Mild atheromatous irregularity about the left carotid bifurcation without flow-limiting stenosis. Both of the vertebral arteries arise from the subclavian arteries. Right vertebral artery dominant. Origin of the vertebral arteries not well evaluated on this exam. Short-segment moderate right V1 stenosis. Vertebral artery is otherwise widely patent within the neck with antegrade flow. IMPRESSION: MRI HEAD IMPRESSION: 1. No acute intracranial process identified. 2. Mild age-related cerebral atrophy with chronic small vessel ischemic disease. MRA HEAD IMPRESSION: Normal intracranial MRA. MRA NECK IMPRESSION: 1. Single short-segment moderate right V1 stenosis. Vertebral arteries otherwise widely patent within the neck with antegrade flow. Right vertebral artery is dominant. 2. Widely patent carotid arteries bilaterally without high-grade or critical stenosis. Electronically Signed   By: Rise Mu M.D.   On: 05/02/2017 05:28   Mr Brain Wo Contrast  Result Date: 05/02/2017 CLINICAL DATA:  Initial evaluation for acute vertigo. EXAM: MRI HEAD WITHOUT CONTRAST MRA HEAD WITHOUT CONTRAST MRA NECK WITHOUT AND WITH CONTRAST TECHNIQUE: Multiplanar, multiecho pulse sequences of the brain and surrounding structures were obtained without intravenous contrast. Angiographic images of the Circle of Willis were obtained using MRA technique without intravenous contrast.  Angiographic images of the neck were obtained using MRA technique without and with intravenous contrast. Carotid stenosis measurements (when applicable) are obtained utilizing NASCET criteria, using the distal internal carotid diameter as the denominator. CONTRAST:  15mL MULTIHANCE GADOBENATE DIMEGLUMINE 529 MG/ML IV SOLN COMPARISON:  None. FINDINGS: MRI HEAD FINDINGS Generalized age-related cerebral atrophy with  mild chronic small vessel ischemic disease. No evidence for acute intracranial infarct. Gray-white matter differentiation maintained. No areas of chronic infarction identified. No evidence for acute or chronic intracranial hemorrhage. No mass lesion, midline shift or mass effect. Ventricular prominence related to global parenchymal volume loss of hydrocephalus. No extra-axial fluid collection. Major dural sinuses are grossly patent. Pituitary gland and suprasellar region within normal limits. Midline structures intact and normal. Major intracranial vascular flow voids maintained. Craniocervical junction within normal limits. Visualized upper cervical spine unremarkable. Bone marrow signal intensity within normal limits. No scalp soft tissue abnormality Globes and orbital soft tissues within normal limits. Patient status post lens extraction bilaterally. Paranasal sinuses are clear. Trace bilateral mastoid effusions noted. MRA HEAD FINDINGS ANTERIOR CIRCULATION: Visualized distal cervical segments of the internal carotid arteries are patent with antegrade flow. Petrous, cavernous, and supraclinoid segments patent without flow-limiting stenosis. A1 segments patent. Anterior communicating artery normal. Anterior cerebral arteries patent to their distal aspects. M1 segments patent without stenosis or occlusion. No proximal M2 occlusion. Distal MCA branches well opacified and symmetric. POSTERIOR CIRCULATION: Vertebral arteries widely patent to the vertebrobasilar junction. Right vertebral artery dominant. Posterior inferior cerebral arteries patent proximally. The basilar artery widely patent. Superior cerebellar is patent bilaterally. Left PCA arises from the basilar and is widely patent to its distal aspect. Fetal type right PCA supplied via a widely patent right posterior communicating artery. Right PCA also widely patent to its distal aspect. No aneurysm or vascular malformation. MRA NECK FINDINGS Visualized aortic  arch of normal caliber with normal branch pattern. No flow-limiting stenosis about the origin of the great vessels. Visualized subclavian arteries widely patent. Right common and internal carotid arteries widely patent without stenosis or occlusion. Mild atheromatous irregularity about the right carotid bifurcation without flow-limiting stenosis. Left common and internal carotid arteries widely patent without stenosis or occlusion. Mild atheromatous irregularity about the left carotid bifurcation without flow-limiting stenosis. Both of the vertebral arteries arise from the subclavian arteries. Right vertebral artery dominant. Origin of the vertebral arteries not well evaluated on this exam. Short-segment moderate right V1 stenosis. Vertebral artery is otherwise widely patent within the neck with antegrade flow. IMPRESSION: MRI HEAD IMPRESSION: 1. No acute intracranial process identified. 2. Mild age-related cerebral atrophy with chronic small vessel ischemic disease. MRA HEAD IMPRESSION: Normal intracranial MRA. MRA NECK IMPRESSION: 1. Single short-segment moderate right V1 stenosis. Vertebral arteries otherwise widely patent within the neck with antegrade flow. Right vertebral artery is dominant. 2. Widely patent carotid arteries bilaterally without high-grade or critical stenosis. Electronically Signed   By: Rise Mu M.D.   On: 05/02/2017 05:28    Procedures Procedures (including critical care time)  Medications Ordered in ED Medications  ondansetron (ZOFRAN) injection 4 mg (4 mg Intravenous Given 05/02/17 0027)  meclizine (ANTIVERT) tablet 25 mg (25 mg Oral Given 05/02/17 0027)  gadobenate dimeglumine (MULTIHANCE) injection 15 mL (15 mLs Intravenous Contrast Given 05/02/17 0437)     Initial Impression / Assessment and Plan / ED Course  I have reviewed the triage vital signs and the nursing notes.  Pertinent labs & imaging results that were available during my care of the patient were  reviewed by me and considered in my medical decision making (see chart for details).     BP (!) 157/84 (BP Location: Right Arm)   Pulse 61   Temp 97.6 F (36.4 C)   Resp 14   Ht 5\' 4"  (1.626 m)   Wt 72.6 kg (160 lb)   SpO2 97%   BMI 27.46 kg/m    Final Clinical Impressions(s) / ED Diagnoses   Final diagnoses:  Peripheral vertigo, unspecified laterality    New Prescriptions New Prescriptions   MECLIZINE (ANTIVERT) 12.5 MG TABLET    Take 1 tablet (12.5 mg total) by mouth 3 (three) times daily as needed for dizziness or nausea.   11:49 PM Elderly female here with acute onset of vertigo. She is having difficulty standing she became symptomatic. Normal HINTS exam. Positive Dix-Hallpike. Suspect peripheral vertigo however, given her age, difficulty ambulating, plan to obtain brain MRI to rule out central causes.  2:46 AM EKG without concerning changes, urine shows no signs of urinary tract infection, labs are at baseline, normal WBC, no signs of anemia. Patient able to ambulate with assistance. Patient states she normally or able to ambulate without assist. She reports improvement of her tremors, and nausea after receiving meclizine and Zofran however her imbalance is still there. Patient is currently awaiting for MRI.  CAre discussed with DR. Pollina.   6:33 AM MRI/MRA of head and neck without acute finding.  Single short-segment moderate right V1 stenosis were noted.  This is consistent with pt chronic R neck pain.  Pt sts her PCP is currently monitoring that.  Pt d/c with meclizine for her vertigo. She's able to ambulate and felt better.  oupt f/u recommended.  Return precaution discussed.     Fayrene Helper, PA-C 05/02/17 1610    Gilda Crease, MD 05/02/17 [id number]

## 2017-05-02 ENCOUNTER — Encounter (HOSPITAL_COMMUNITY): Payer: Self-pay | Admitting: *Deleted

## 2017-05-02 ENCOUNTER — Telehealth: Payer: Self-pay | Admitting: *Deleted

## 2017-05-02 ENCOUNTER — Emergency Department (HOSPITAL_COMMUNITY): Payer: Medicare Other

## 2017-05-02 LAB — URINALYSIS, ROUTINE W REFLEX MICROSCOPIC
Bacteria, UA: NONE SEEN
Bilirubin Urine: NEGATIVE
Glucose, UA: NEGATIVE mg/dL
HGB URINE DIPSTICK: NEGATIVE
Ketones, ur: 5 mg/dL — AB
Nitrite: NEGATIVE
Protein, ur: NEGATIVE mg/dL
SPECIFIC GRAVITY, URINE: 1.012 (ref 1.005–1.030)
Squamous Epithelial / LPF: NONE SEEN
pH: 6 (ref 5.0–8.0)

## 2017-05-02 LAB — BASIC METABOLIC PANEL
Anion gap: 11 (ref 5–15)
BUN: 27 mg/dL — AB (ref 6–20)
CO2: 21 mmol/L — ABNORMAL LOW (ref 22–32)
CREATININE: 1.08 mg/dL — AB (ref 0.44–1.00)
Calcium: 8.7 mg/dL — ABNORMAL LOW (ref 8.9–10.3)
Chloride: 100 mmol/L — ABNORMAL LOW (ref 101–111)
GFR calc Af Amer: 53 mL/min — ABNORMAL LOW (ref >60)
GFR, EST NON AFRICAN AMERICAN: 46 mL/min — AB (ref >60)
Glucose, Bld: 146 mg/dL — ABNORMAL HIGH (ref 65–99)
Potassium: 4.1 mmol/L (ref 3.5–5.1)
SODIUM: 132 mmol/L — AB (ref 135–145)

## 2017-05-02 MED ORDER — MECLIZINE HCL 12.5 MG PO TABS: 12.5000 mg | ORAL_TABLET | Freq: Three times a day (TID) | ORAL | 0 refills | Status: DC | PRN

## 2017-05-02 MED ORDER — GADOBENATE DIMEGLUMINE 529 MG/ML IV SOLN
15.0000 mL | Freq: Once | INTRAVENOUS | Status: AC | PRN
  Administered 2017-05-02: 15 mL via INTRAVENOUS

## 2017-05-02 NOTE — Telephone Encounter (Signed)
Pt called stating she did not receive printed Rx.  EDCM had pt go through packet and she found it.  Pt will take Rx to Pharmacy promptly.

## 2017-05-02 NOTE — ED Notes (Signed)
Walked pt in the hallway, pt was un steady with 2 people and a walker and pt stated "this is not normally how I walk, I usually get around without any help".

## 2017-05-02 NOTE — Discharge Instructions (Signed)
Please take meclizine as needed for dizziness.  Follow up with your doctor for further care.  Return if you have any concerns.

## 2017-05-02 NOTE — ED Provider Notes (Signed)
Patient presented to the ER with dizziness. Patient reports that she feels like she is spinning whenever she stands up or moves her head. This has caused nausea. She feels unsteady, cannot walk because of the dizziness. No history of vertigo. No headache. She reports a lifetime history of "balance problems".  Face to face Exam: HEENT - PERRLA Lungs - CTAB Heart - RRR, no M/R/G Abd - S/NT/ND Neuro - alert, oriented x3 normal tone, no extremity weakness, numbness or tingling  Plan: Patient with vertigo, central versus peripheral. Will obtain MRI to rule out CVA, MRA to rule out vertebral-basilar system disease.   Gilda CreasePollina, Gedalya Jim J, MD 05/02/17 [id number]

## 2017-06-16 ENCOUNTER — Encounter (HOSPITAL_COMMUNITY): Payer: Medicare Other

## 2017-06-16 ENCOUNTER — Ambulatory Visit (HOSPITAL_COMMUNITY)
Admission: RE | Admit: 2017-06-16 | Discharge: 2017-06-16 | Disposition: A | Discharge status: Home / Self Care | Payer: Medicare Other | Source: Ambulatory Visit | Attending: Vascular Surgery | Admitting: Vascular Surgery

## 2017-06-16 ENCOUNTER — Other Ambulatory Visit (HOSPITAL_COMMUNITY): Payer: Self-pay | Admitting: Internal Medicine

## 2017-06-16 DIAGNOSIS — E785 Hyperlipidemia, unspecified: Secondary | ICD-10-CM | POA: Diagnosis not present

## 2017-06-16 DIAGNOSIS — I739 Peripheral vascular disease, unspecified: Secondary | ICD-10-CM | POA: Insufficient documentation

## 2019-07-31 ENCOUNTER — Emergency Department (HOSPITAL_COMMUNITY): Payer: Medicare Other

## 2019-07-31 ENCOUNTER — Encounter (HOSPITAL_COMMUNITY): Payer: Self-pay | Admitting: Emergency Medicine

## 2019-07-31 ENCOUNTER — Other Ambulatory Visit: Payer: Self-pay

## 2019-07-31 ENCOUNTER — Emergency Department (HOSPITAL_COMMUNITY)
Admission: EM | Admit: 2019-07-31 | Discharge: 2019-07-31 | Disposition: A | Discharge status: Home / Self Care | Payer: Medicare Other | Attending: Emergency Medicine | Admitting: Emergency Medicine

## 2019-07-31 DIAGNOSIS — N183 Chronic kidney disease, stage 3 (moderate): Secondary | ICD-10-CM | POA: Diagnosis not present

## 2019-07-31 DIAGNOSIS — W01198A Fall on same level from slipping, tripping and stumbling with subsequent striking against other object, initial encounter: Secondary | ICD-10-CM | POA: Insufficient documentation

## 2019-07-31 DIAGNOSIS — Y9389 Activity, other specified: Secondary | ICD-10-CM | POA: Diagnosis not present

## 2019-07-31 DIAGNOSIS — Z79899 Other long term (current) drug therapy: Secondary | ICD-10-CM | POA: Insufficient documentation

## 2019-07-31 DIAGNOSIS — Z23 Encounter for immunization: Secondary | ICD-10-CM | POA: Insufficient documentation

## 2019-07-31 DIAGNOSIS — S41112A Laceration without foreign body of left upper arm, initial encounter: Secondary | ICD-10-CM | POA: Diagnosis not present

## 2019-07-31 DIAGNOSIS — Y999 Unspecified external cause status: Secondary | ICD-10-CM | POA: Insufficient documentation

## 2019-07-31 DIAGNOSIS — S4992XA Unspecified injury of left shoulder and upper arm, initial encounter: Secondary | ICD-10-CM | POA: Diagnosis present

## 2019-07-31 DIAGNOSIS — Z87891 Personal history of nicotine dependence: Secondary | ICD-10-CM | POA: Diagnosis not present

## 2019-07-31 DIAGNOSIS — I129 Hypertensive chronic kidney disease with stage 1 through stage 4 chronic kidney disease, or unspecified chronic kidney disease: Secondary | ICD-10-CM | POA: Insufficient documentation

## 2019-07-31 DIAGNOSIS — Y92009 Unspecified place in unspecified non-institutional (private) residence as the place of occurrence of the external cause: Secondary | ICD-10-CM

## 2019-07-31 DIAGNOSIS — Y92002 Bathroom of unspecified non-institutional (private) residence single-family (private) house as the place of occurrence of the external cause: Secondary | ICD-10-CM | POA: Diagnosis not present

## 2019-07-31 DIAGNOSIS — W19XXXA Unspecified fall, initial encounter: Secondary | ICD-10-CM

## 2019-07-31 MED ORDER — CLINDAMYCIN HCL 300 MG PO CAPS: 300.0000 mg | ORAL_CAPSULE | Freq: Four times a day (QID) | ORAL | 0 refills | Status: DC

## 2019-07-31 MED ORDER — CLINDAMYCIN HCL 150 MG PO CAPS
300.0000 mg | ORAL_CAPSULE | Freq: Once | ORAL | Status: AC
  Administered 2019-07-31: 05:00:00 300 mg via ORAL
  Filled 2019-07-31: qty 2

## 2019-07-31 MED ORDER — TETANUS-DIPHTH-ACELL PERTUSSIS 5-2.5-18.5 LF-MCG/0.5 IM SUSP
0.5000 mL | Freq: Once | INTRAMUSCULAR | Status: AC
  Administered 2019-07-31: 05:00:00 0.5 mL via INTRAMUSCULAR
  Filled 2019-07-31: qty 0.5

## 2019-07-31 MED ORDER — LIDOCAINE-EPINEPHRINE (PF) 2 %-1:200000 IJ SOLN
20.0000 mL | Freq: Once | INTRAMUSCULAR | Status: AC
  Administered 2019-07-31: 20 mL
  Filled 2019-07-31: qty 20

## 2019-07-31 NOTE — ED Provider Notes (Signed)
Emergency Department Provider Note   I have reviewed the triage vital signs and the nursing notes.   HISTORY  Chief Complaint Fall   HPI Cheryl Peterson is a 85 y.o. female went to stand up from toilet, fell backwards breaking the toilet and causing laceration to left upper posterior arm. EMS called and brought here. No pain elsewhere, no head/neck trauma. No blood thinners. No syncope.    No other associated or modifying symptoms.    Past Medical History:  Diagnosis Date  . Chest pain   . Decreased diffusion capacity   . Hyperlipidemia   . Hypertension   . SOB (shortness of breath)     Patient Active Problem List   Diagnosis Date Noted  . Gastroenteritis due to norovirus 02/16/2016  . Dehydration 02/16/2016  . Nausea vomiting and diarrhea 02/15/2016  . Hypertension 02/15/2016  . Intractable vomiting with nausea 02/15/2016  . CKD (chronic kidney disease), stage III ([place])   . Chest pain   . SOB (shortness of breath)   . Hyperlipidemia   . PAF (paroxysmal atrial fibrillation) ([place])   . Varicose veins   . Decreased diffusion capacity     Past Surgical History:  Procedure Laterality Date  . TONSILLECTOMY  1939  . VESICOVAGINAL FISTULA CLOSURE W/ TAH  10/28/84    Current Outpatient Rx  . Order #: [id number] Class: Historical Med  . Order #: [id number] Class: Historical Med  . Order #: [id number] Class: Normal    Allergies Penicillins  Family History  Problem Relation Age of Onset  . Stroke Mother   . Hypertension Mother   . Deep vein thrombosis Father   . Clotting disorder Sister   . Heart disease Sister   . Heart failure Sister   . Clotting disorder Brother   . Heart disease Brother   . Heart failure Brother   . Colon cancer Neg Hx     Social History Social History   Tobacco Use  . Smoking status: Former Smoker    Quit date: 11/28/1978    Years since quitting: 40.6  . Smokeless tobacco: Never Used  Substance Use Topics  . Alcohol use: No  . Drug  use: No    Review of Systems  All other systems negative except as documented in the HPI. All pertinent positives and negatives as reviewed in the HPI. ____________________________________________   PHYSICAL EXAM:  VITAL SIGNS: ED Triage Vitals  Enc Vitals Group     BP 07/31/19 0216 (!) 195/73     Pulse Rate 07/31/19 0216 67     Resp 07/31/19 0216 16     Temp 07/31/19 0216 98.5 F (36.9 C)     Temp Source 07/31/19 0216 Oral     SpO2 07/31/19 0216 98 %    Constitutional: Alert and oriented. Well appearing and in no acute distress. Eyes: Conjunctivae are normal. PERRL. EOMI. Head: Atraumatic. Nose: No congestion/rhinnorhea. Mouth/Throat: Mucous membranes are moist.  Oropharynx non-erythematous. Neck: No stridor.  No meningeal signs.   Cardiovascular: Normal rate, regular rhythm. Good peripheral circulation. Grossly normal heart sounds.   Respiratory: Normal respiratory effort.  No retractions. Lungs CTAB. Gastrointestinal: Soft and nontender. No distention.  Musculoskeletal: No lower extremity tenderness nor edema. No gross deformities of extremities. Neurologic:  Normal speech and language. No gross focal neurologic deficits are appreciated.  Skin:  Skin is warm, dry and has 6 cm non curvilinear laceration to left posterior upper arm, approximately 2 cm deep. No visible bone or significant MSK involvement.  Not in area of ulnar nerve. No rash noted.   ____________________________________________   RADIOLOGY  Dg Humerus Left  Result Date: 07/31/2019 CLINICAL DATA:  Fall into toilet. Laceration. Broken toe level. Initial encounter. EXAM: LEFT HUMERUS - 2+ VIEW COMPARISON:  None. FINDINGS: Soft tissue laceration is noted along the posterior aspect of the elbow. Elbow joint is located. No acute fracture dislocation is present. IMPRESSION: 1. Posterior soft tissue laceration. 2. No radiopaque foreign body or fracture. Electronically Signed   By: Marin Roberts M.D.   On:  07/31/2019 04:03    ____________________________________________   PROCEDURES  Procedure(s) performed:   Marland KitchenMarland KitchenLaceration Repair  Date/Time: 07/31/2019 4:54 AM Performed by: Marily Memos, MD Authorized by: Marily Memos, MD   Consent:    Consent obtained:  Verbal   Consent given by:  Patient   Risks discussed:  Infection, need for additional repair, nerve damage, poor wound healing, poor cosmetic result, pain, retained foreign body, tendon damage and vascular damage   Alternatives discussed:  No treatment, delayed treatment and observation Anesthesia (see MAR for exact dosages):    Anesthesia method:  Local infiltration   Local anesthetic:  Lidocaine 1% WITH epi Laceration details:    Location:  Shoulder/arm   Shoulder/arm location:  L upper arm   Length (cm):  6   Depth (mm):  2 Repair type:    Repair type:  Intermediate Pre-procedure details:    Preparation:  Patient was prepped and draped in usual sterile fashion and imaging obtained to evaluate for foreign bodies Exploration:    Wound exploration: wound explored through full range of motion and entire depth of wound probed and visualized     Wound extent: areolar tissue violated and fascia violated     Wound extent: no foreign bodies/material noted, no muscle damage noted, no nerve damage noted, no tendon damage noted, no underlying fracture noted and no vascular damage noted     Contaminated: no   Treatment:    Area cleansed with:  Saline   Amount of cleaning:  Extensive   Irrigation solution:  Sterile water   Irrigation volume:  400cc   Irrigation method:  Syringe   Visualized foreign bodies/material removed: no   Subcutaneous repair:    Suture size:  4-0   Suture material:  Vicryl   Number of sutures:  5 Skin repair:    Repair method:  Staples   Number of staples:  10 Approximation:    Approximation:  Close Post-procedure details:    Dressing:  Antibiotic ointment, non-adherent dressing, sterile dressing and  tube gauze   Patient tolerance of procedure:  Tolerated well, no immediate complications     ____________________________________________   INITIAL IMPRESSION / ASSESSMENT AND PLAN / ED COURSE  XR then explore/evaluate wound for repair. Seems to be NVI at this time.   Wound repaired as above. NVI following procedure. No obvious bone contamination but wound was close to it so copious amounts of irrigation were performed and will start antibiotics for prophylaxis. TDAP updated. rtn in 7-10 days for suture removal.   Pertinent labs & imaging results that were available during my care of the patient were reviewed by me and considered in my medical decision making (see chart for details).  A medical screening exam was performed and I feel the patient has had an appropriate workup for their chief complaint at this time and likelihood of emergent condition existing is low. They have been counseled on decision, discharge, follow up and which symptoms necessitate  immediate return to the emergency department. They or their family verbally stated understanding and agreement with plan and discharged in stable condition.   ____________________________________________  FINAL CLINICAL IMPRESSION(S) / ED DIAGNOSES  Final diagnoses:  Fall in home, initial encounter  Laceration of left upper arm, initial encounter     MEDICATIONS GIVEN DURING THIS VISIT:  Medications  clindamycin (CLEOCIN) capsule 300 mg (has no administration in time range)  Tdap (BOOSTRIX) injection 0.5 mL (has no administration in time range)  lidocaine-EPINEPHrine (XYLOCAINE W/EPI) 2 %-1:200000 (PF) injection 20 mL (20 mLs Infiltration Given 07/31/19 0429)     NEW OUTPATIENT MEDICATIONS STARTED DURING THIS VISIT:  New Prescriptions   CLINDAMYCIN (CLEOCIN) 300 MG CAPSULE    Take 1 capsule (300 mg total) by mouth 4 (four) times daily. X 7 days    Note:  This note was prepared with assistance of Dragon voice recognition  software. Occasional wrong-word or sound-a-like substitutions may have occurred due to the inherent limitations of voice recognition software.   Maize Brittingham, Barbara CowerJason, MD 07/31/19 [phone]

## 2019-07-31 NOTE — ED Notes (Signed)
Patient verbalizes understanding of discharge instructions. Opportunity for questioning and answers were provided. Armband removed by staff, pt discharged from ED.  

## 2019-07-31 NOTE — ED Notes (Signed)
Patient transported to X-ray 

## 2019-07-31 NOTE — ED Triage Notes (Signed)
Per pt she fell tonight into toilet tonight. Did not hit her head but did hit the left arm and busted the porcelain and it cut her arm into the bone per ems. Bleeding is controlled. Pt is not on any blood thinners.

## 2019-12-20 ENCOUNTER — Ambulatory Visit: Payer: Self-pay | Attending: Internal Medicine

## 2019-12-20 DIAGNOSIS — Z23 Encounter for immunization: Secondary | ICD-10-CM | POA: Insufficient documentation

## 2019-12-20 NOTE — Progress Notes (Signed)
   Covid-19 Vaccination Clinic  Name:  Cheryl Peterson    MRN: [id number] DOB: [birth year]  12/20/2019  [name] was observed post Covid-19 immunization for 15 minutes without incidence. She was provided with Vaccine Information Sheet and instruction to access the V-Safe system.   [name] was instructed to call 911 with any severe reactions post vaccine: Marland Kitchen Difficulty breathing  . Swelling of your face and throat  . A fast heartbeat  . A bad rash all over your body  . Dizziness and weakness    Immunizations Administered    Name Date Dose VIS Date Route   Pfizer COVID-19 Vaccine 12/20/2019 10:48 AM 0.3 mL 11/08/2019 Intramuscular   Manufacturer: [employer], [employer]   Lot: [id number]   NDC: [address]-1

## 2020-01-10 ENCOUNTER — Ambulatory Visit: Payer: Medicare PPO | Attending: Internal Medicine

## 2020-01-10 DIAGNOSIS — Z23 Encounter for immunization: Secondary | ICD-10-CM

## 2020-01-10 NOTE — Progress Notes (Signed)
   Covid-19 Vaccination Clinic  Name:  FALYN RUBEL    MRN: [id number] DOB: [birth year]  01/10/2020  Ms. Glastetter was observed post Covid-19 immunization for 15 minutes without incidence. She was provided with Vaccine Information Sheet and instruction to access the V-Safe system.   Ms. Holcomb was instructed to call 911 with any severe reactions post vaccine: Marland Kitchen Difficulty breathing  . Swelling of your face and throat  . A fast heartbeat  . A bad rash all over your body  . Dizziness and weakness    Immunizations Administered    Name Date Dose VIS Date Route   Pfizer COVID-19 Vaccine 01/10/2020  2:00 PM 0.3 mL 11/08/2019 Intramuscular   Manufacturer: [employer], [employer]   Lot: [id number]   NDC: [address]-2

## 2020-09-05 DIAGNOSIS — Z23 Encounter for immunization: Secondary | ICD-10-CM | POA: Diagnosis not present

## 2020-11-23 ENCOUNTER — Observation Stay (HOSPITAL_COMMUNITY)
Admission: EM | Admit: 2020-11-23 | Discharge: 2020-11-24 | Disposition: A | Discharge status: Home / Self Care | Payer: Medicare PPO | Attending: Internal Medicine | Admitting: Internal Medicine

## 2020-11-23 ENCOUNTER — Emergency Department (HOSPITAL_COMMUNITY): Payer: Medicare PPO

## 2020-11-23 ENCOUNTER — Encounter (HOSPITAL_COMMUNITY): Payer: Self-pay

## 2020-11-23 DIAGNOSIS — R0902 Hypoxemia: Secondary | ICD-10-CM | POA: Diagnosis not present

## 2020-11-23 DIAGNOSIS — Z20822 Contact with and (suspected) exposure to covid-19: Secondary | ICD-10-CM | POA: Diagnosis not present

## 2020-11-23 DIAGNOSIS — R0682 Tachypnea, not elsewhere classified: Secondary | ICD-10-CM | POA: Diagnosis not present

## 2020-11-23 DIAGNOSIS — Z87891 Personal history of nicotine dependence: Secondary | ICD-10-CM | POA: Diagnosis not present

## 2020-11-23 DIAGNOSIS — F32A Depression, unspecified: Secondary | ICD-10-CM | POA: Diagnosis not present

## 2020-11-23 DIAGNOSIS — Z79899 Other long term (current) drug therapy: Secondary | ICD-10-CM | POA: Diagnosis not present

## 2020-11-23 DIAGNOSIS — K529 Noninfective gastroenteritis and colitis, unspecified: Secondary | ICD-10-CM | POA: Diagnosis not present

## 2020-11-23 DIAGNOSIS — B9689 Other specified bacterial agents as the cause of diseases classified elsewhere: Secondary | ICD-10-CM | POA: Diagnosis not present

## 2020-11-23 DIAGNOSIS — R109 Unspecified abdominal pain: Secondary | ICD-10-CM | POA: Diagnosis not present

## 2020-11-23 DIAGNOSIS — R112 Nausea with vomiting, unspecified: Secondary | ICD-10-CM | POA: Diagnosis present

## 2020-11-23 DIAGNOSIS — R1110 Vomiting, unspecified: Secondary | ICD-10-CM | POA: Diagnosis not present

## 2020-11-23 DIAGNOSIS — Z743 Need for continuous supervision: Secondary | ICD-10-CM | POA: Diagnosis not present

## 2020-11-23 DIAGNOSIS — N390 Urinary tract infection, site not specified: Secondary | ICD-10-CM | POA: Diagnosis present

## 2020-11-23 DIAGNOSIS — I129 Hypertensive chronic kidney disease with stage 1 through stage 4 chronic kidney disease, or unspecified chronic kidney disease: Secondary | ICD-10-CM | POA: Diagnosis not present

## 2020-11-23 DIAGNOSIS — N183 Chronic kidney disease, stage 3 unspecified: Secondary | ICD-10-CM | POA: Diagnosis not present

## 2020-11-23 DIAGNOSIS — N3001 Acute cystitis with hematuria: Secondary | ICD-10-CM | POA: Insufficient documentation

## 2020-11-23 DIAGNOSIS — E785 Hyperlipidemia, unspecified: Secondary | ICD-10-CM | POA: Diagnosis present

## 2020-11-23 DIAGNOSIS — R1111 Vomiting without nausea: Secondary | ICD-10-CM | POA: Diagnosis not present

## 2020-11-23 DIAGNOSIS — N3000 Acute cystitis without hematuria: Secondary | ICD-10-CM | POA: Diagnosis not present

## 2020-11-23 DIAGNOSIS — N1831 Chronic kidney disease, stage 3a: Secondary | ICD-10-CM | POA: Diagnosis present

## 2020-11-23 DIAGNOSIS — I10 Essential (primary) hypertension: Secondary | ICD-10-CM | POA: Diagnosis not present

## 2020-11-23 DIAGNOSIS — R110 Nausea: Secondary | ICD-10-CM | POA: Diagnosis not present

## 2020-11-23 LAB — RESP PANEL BY RT-PCR (FLU A&B, COVID) ARPGX2
Influenza A by PCR: NEGATIVE
Influenza B by PCR: NEGATIVE
SARS Coronavirus 2 by RT PCR: NEGATIVE

## 2020-11-23 LAB — COMPREHENSIVE METABOLIC PANEL
ALT: 16 U/L (ref 0–44)
AST: 27 U/L (ref 15–41)
Albumin: 3.6 g/dL (ref 3.5–5.0)
Alkaline Phosphatase: 82 U/L (ref 38–126)
Anion gap: 15 (ref 5–15)
BUN: 20 mg/dL (ref 8–23)
CO2: 18 mmol/L — ABNORMAL LOW (ref 22–32)
Calcium: 8.8 mg/dL — ABNORMAL LOW (ref 8.9–10.3)
Chloride: 104 mmol/L (ref 98–111)
Creatinine, Ser: 1.08 mg/dL — ABNORMAL HIGH (ref 0.44–1.00)
GFR, Estimated: 49 mL/min — ABNORMAL LOW (ref >60)
Glucose, Bld: 141 mg/dL — ABNORMAL HIGH (ref 70–99)
Potassium: 4.5 mmol/L (ref 3.5–5.1)
Sodium: 137 mmol/L (ref 135–145)
Total Bilirubin: 1.1 mg/dL (ref 0.3–1.2)
Total Protein: 6.5 g/dL (ref 6.5–8.1)

## 2020-11-23 LAB — URINALYSIS, ROUTINE W REFLEX MICROSCOPIC
Bilirubin Urine: NEGATIVE
Glucose, UA: NEGATIVE mg/dL
Ketones, ur: NEGATIVE mg/dL
Nitrite: NEGATIVE
Protein, ur: NEGATIVE mg/dL
Specific Gravity, Urine: 1.026 (ref 1.005–1.030)
WBC, UA: >50 WBC/hpf — ABNORMAL HIGH (ref 0–5)
pH: 5 (ref 5.0–8.0)

## 2020-11-23 LAB — CBC WITH DIFFERENTIAL/PLATELET
Abs Immature Granulocytes: 0.04 10*3/uL (ref 0.00–0.07)
Basophils Absolute: 0 10*3/uL (ref 0.0–0.1)
Basophils Relative: 0 %
Eosinophils Absolute: 0.1 10*3/uL (ref 0.0–0.5)
Eosinophils Relative: 1 %
HCT: 41 % (ref 36.0–46.0)
Hemoglobin: 13.7 g/dL (ref 12.0–15.0)
Immature Granulocytes: 0 %
Lymphocytes Relative: 5 %
Lymphs Abs: 0.5 10*3/uL — ABNORMAL LOW (ref 0.7–4.0)
MCH: 31.8 pg (ref 26.0–34.0)
MCHC: 33.4 g/dL (ref 30.0–36.0)
MCV: 95.1 fL (ref 80.0–100.0)
Monocytes Absolute: 0.4 10*3/uL (ref 0.1–1.0)
Monocytes Relative: 3 %
Neutro Abs: 9.7 10*3/uL — ABNORMAL HIGH (ref 1.7–7.7)
Neutrophils Relative %: 91 %
Platelets: 207 10*3/uL (ref 150–400)
RBC: 4.31 MIL/uL (ref 3.87–5.11)
RDW: 12.9 % (ref 11.5–15.5)
WBC: 10.6 10*3/uL — ABNORMAL HIGH (ref 4.0–10.5)
nRBC: 0 % (ref 0.0–0.2)

## 2020-11-23 LAB — TROPONIN I (HIGH SENSITIVITY)
Troponin I (High Sensitivity): 8 ng/L (ref <18)
Troponin I (High Sensitivity): 10 ng/L (ref <18)

## 2020-11-23 LAB — LIPASE, BLOOD: Lipase: 20 U/L (ref 11–51)

## 2020-11-23 MED ORDER — SODIUM CHLORIDE 0.9 % IV BOLUS: 1000.0000 mL | Freq: Once | INTRAVENOUS | Status: DC

## 2020-11-23 MED ORDER — IOHEXOL 300 MG/ML  SOLN
70.0000 mL | Freq: Once | INTRAMUSCULAR | Status: AC | PRN
  Administered 2020-11-23: 70 mL via INTRAVENOUS

## 2020-11-23 MED ORDER — ONDANSETRON HCL 4 MG/2ML IJ SOLN
4.0000 mg | Freq: Once | INTRAMUSCULAR | Status: AC
  Administered 2020-11-23: 4 mg via INTRAVENOUS
  Filled 2020-11-23: qty 2

## 2020-11-23 MED ORDER — FAMOTIDINE IN NACL 20-0.9 MG/50ML-% IV SOLN
20.0000 mg | Freq: Once | INTRAVENOUS | Status: AC
  Administered 2020-11-23: 20 mg via INTRAVENOUS
  Filled 2020-11-23: qty 50

## 2020-11-23 MED ORDER — LACTATED RINGERS IV BOLUS
1000.0000 mL | Freq: Once | INTRAVENOUS | Status: AC
  Administered 2020-11-23: 1000 mL via INTRAVENOUS

## 2020-11-23 MED ORDER — SODIUM CHLORIDE 0.9 % IV SOLN
1.0000 g | Freq: Once | INTRAVENOUS | Status: AC
  Administered 2020-11-23: 1 g via INTRAVENOUS
  Filled 2020-11-23: qty 10

## 2020-11-23 NOTE — ED Provider Notes (Signed)
[facility]   CSN: [id number] Arrival date & time: 11/23/20  1633     History Chief Complaint  Patient presents with  . Nausea  . Emesis  . Diarrhea    Hyacinth MeekerJean S Winebarger is a 86 y.o. female with past medical history of hypertension, hyperlipidemia, CKD stage III, that presents to the emergency department today for nausea, vomiting and diarrhea that began this morning.  Patient states that she had orange juice and cereal bar for breakfast, then started feeling nauseous, did have 3 episodes o off emesis this morning.  States that she did have an episode of abdominal pain that lasted a couple of seconds which was in her left upper quadrant area, has gone away.  States that she also started having a couple episodes of diarrhea this morning.  Nonbloody or mucousy.  Denies any flank pain, back pain, urinary symptoms.  Patient denies any fevers or chills.  Patient was found to be 100 F rectally, no tachycardia.  Denies any chest pain or shortness of breath.  States that she was in her normal health yesterday.  Denies any cough, myalgias or URI symptoms.  Has been vaccinated boosted against COVID-19.  States that she is generally healthy, lives alone at home states that she takes care of herself. Has been feeling weak today, no focal weakness. No headache or vision changes.Per chart review patient was seen in the emergency department for similar presentation 3 years ago, was admitted for intractable nausea and vomiting and diarrhea.  Denies any abdominal surgeries.  No other complaints.  HPI     Past Medical History:  Diagnosis Date  . Chest pain   . Decreased diffusion capacity   . Hyperlipidemia   . Hypertension   . SOB (shortness of breath)     Patient Active Problem List   Diagnosis Date Noted  . Nausea & vomiting 11/23/2020  . Gastroenteritis due to norovirus 02/16/2016  . Dehydration 02/16/2016  . Nausea vomiting and diarrhea  02/15/2016  . Hypertension 02/15/2016  . Intractable vomiting with nausea 02/15/2016  . CKD (chronic kidney disease), stage III ([facility])   . Chest pain   . SOB (shortness of breath)   . Hyperlipidemia   . PAF (paroxysmal atrial fibrillation) ([facility])   . Varicose veins   . Decreased diffusion capacity     Past Surgical History:  Procedure Laterality Date  . TONSILLECTOMY  1939  . VESICOVAGINAL FISTULA CLOSURE W/ TAH  10/28/84     OB History   No obstetric history on file.     Family History  Problem Relation Age of Onset  . Stroke Mother   . Hypertension Mother   . Deep vein thrombosis Father   . Clotting disorder Sister   . Heart disease Sister   . Heart failure Sister   . Clotting disorder Brother   . Heart disease Brother   . Heart failure Brother   . Colon cancer Neg Hx     Social History   Tobacco Use  . Smoking status: Former Smoker    Quit date: 11/28/1978    Years since quitting: 42.0  . Smokeless tobacco: Never Used  Substance Use Topics  . Alcohol use: No  . Drug use: No    Home Medications Prior to Admission medications   Medication Sig Start Date End Date Taking? Authorizing Provider  atorvastatin (LIPITOR) 10 MG tablet Take 10 mg by mouth at bedtime.   Yes [provider]  PARoxetine (PAXIL) 20 MG tablet Take 20 mg by mouth every morning.   Yes [provider]    Allergies    Penicillins  Review of Systems   Review of Systems  Constitutional: Negative for chills, diaphoresis, fatigue and fever.  HENT: Negative for congestion, sore throat and trouble swallowing.   Eyes: Negative for pain and visual disturbance.  Respiratory: Negative for cough, shortness of breath and wheezing.   Cardiovascular: Negative for chest pain, palpitations and leg swelling.  Gastrointestinal: Positive for diarrhea, nausea and vomiting. Negative for abdominal distention and abdominal pain.  Genitourinary: Negative for difficulty urinating.   Musculoskeletal: Negative for back pain, neck pain and neck stiffness.  Skin: Negative for pallor.  Neurological: Negative for dizziness, speech difficulty, weakness and headaches.  Psychiatric/Behavioral: Negative for confusion.    Physical Exam Updated Vital Signs BP 134/62   Pulse 84   Temp 100 F (37.8 C) (Rectal)   Resp (!) 21   SpO2 99%   Physical Exam Constitutional:      General: She is not in acute distress.    Appearance: Normal appearance. She is not ill-appearing, toxic-appearing or diaphoretic.     Comments: Well-appearing pleasant female, laying in bed in no acute distress.  Empty emesis bag.  HENT:     Mouth/Throat:     Mouth: Mucous membranes are moist.     Pharynx: Oropharynx is clear.  Eyes:     General: No scleral icterus.    Extraocular Movements: Extraocular movements intact.     Pupils: Pupils are equal, round, and reactive to light.  Cardiovascular:     Rate and Rhythm: Normal rate and regular rhythm.     Pulses: Normal pulses.     Heart sounds: Normal heart sounds.  Pulmonary:     Effort: Pulmonary effort is normal. No respiratory distress.     Breath sounds: Normal breath sounds. No stridor. No wheezing, rhonchi or rales.  Chest:     Chest wall: No tenderness.  Abdominal:     General: Abdomen is flat. There is no distension.     Palpations: Abdomen is soft.     Tenderness: There is no abdominal tenderness. There is no guarding or rebound.     Comments: No abdominal tenderness.   Musculoskeletal:        General: No swelling or tenderness. Normal range of motion.     Cervical back: Normal range of motion and neck supple. No rigidity.     Right lower leg: No edema.     Left lower leg: No edema.  Skin:    General: Skin is warm and dry.     Capillary Refill: Capillary refill takes less than 2 seconds.     Coloration: Skin is not pale.  Neurological:     General: No focal deficit present.     Mental Status: She is alert and oriented to person,  place, and time.  Psychiatric:        Mood and Affect: Mood normal.        Behavior: Behavior normal.     ED Results / Procedures / Treatments   Labs (all labs ordered are listed, but only abnormal results are displayed) Labs Reviewed  CBC WITH DIFFERENTIAL/PLATELET - Abnormal; Notable for the following components:      Result Value   WBC 10.6 (*)    Neutro Abs 9.7 (*)    Lymphs Abs 0.5 (*)    All other components within normal limits  COMPREHENSIVE METABOLIC  PANEL - Abnormal; Notable for the following components:   CO2 18 (*)    Glucose, Bld 141 (*)    Creatinine, Ser 1.08 (*)    Calcium 8.8 (*)    GFR, Estimated 49 (*)    All other components within normal limits  URINALYSIS, ROUTINE W REFLEX MICROSCOPIC - Abnormal; Notable for the following components:   Hgb urine dipstick SMALL (*)    Leukocytes,Ua LARGE (*)    WBC, UA >50 (*)    Bacteria, UA RARE (*)    All other components within normal limits  RESP PANEL BY RT-PCR (FLU A&B, COVID) [id number]  URINE CULTURE  LIPASE, BLOOD  TROPONIN I (HIGH SENSITIVITY)  TROPONIN I (HIGH SENSITIVITY)    EKG EKG Interpretation  Date/Time:  Monday November 23 2020 16:40:24 EST Ventricular Rate:  87 PR Interval:    QRS Duration: 99 QT Interval:  397 QTC Calculation: 478 R Axis:   -47 Text Interpretation: Sinus rhythm Left anterior fascicular block Low voltage, precordial leads Minimal ST depression, lateral leads subtle anterolateral ST depression compared to previous Confirmed by Arby Barrette [id number]) on 11/23/2020 4:54:17 PM   Radiology CT Abdomen Pelvis W Contrast  Result Date: 11/23/2020 CLINICAL DATA:  Abdominal pain, nausea, vomiting, diarrhea. EXAM: CT ABDOMEN AND PELVIS WITH CONTRAST TECHNIQUE: Multidetector CT imaging of the abdomen and pelvis was performed using the standard protocol following bolus administration of intravenous contrast. CONTRAST:  18mL OMNIPAQUE IOHEXOL 300 MG/ML  SOLN COMPARISON:  CT abdomen dated  08/29/2011 FINDINGS: Lower chest: No acute abnormality. Hepatobiliary: No focal liver abnormality is seen. Gallbladder is unremarkable. Common bile duct is stable in caliber and configuration. Pancreas: Stable low-density focus within the pancreatic head, the nearly 10 year stability indicating benignity. Fatty infiltration of the pancreatic body and tail. No acute findings. Spleen: Normal in size without focal abnormality. Adrenals/Urinary Tract: Adrenal glands are unremarkable. Kidneys are unremarkable without suspicious mass, stone or hydronephrosis. No perinephric fluid. Now ureteral or bladder calculi are identified. Bladder is unremarkable.z Stomach/Bowel: No dilated large or small bowel loops are identified. No evidence of bowel wall inflammation. Appendix is unremarkable. Stomach is unremarkable, partially decompressed. Small hiatal hernia. Diverticulosis of the sigmoid colon but no focal inflammatory change to suggest acute diverticulitis. Vascular/Lymphatic: Aortic atherosclerosis. No acute appearing vascular abnormality. No enlarged lymph nodes are seen in the abdomen or pelvis. Reproductive: Presumed hysterectomy.  No adnexal mass or free fluid. Other: No free fluid or abscess collection. No free intraperitoneal air. Musculoskeletal: Degenerative spondylosis of the thoracolumbar spine, at least moderate in degree. No acute appearing osseous abnormality. IMPRESSION: 1. No acute findings within the abdomen or pelvis. No bowel obstruction or evidence of bowel wall inflammation. No evidence of acute solid organ abnormality. No renal or ureteral calculi. Appendix is unremarkable. 2. Colonic diverticulosis without evidence of acute diverticulitis. 3. Small hiatal hernia. Aortic Atherosclerosis (ICD10-I70.0). Electronically Signed   By: Bary Richard M.D.   On: 11/23/2020 18:59   DG Chest Port 1 View  Result Date: 11/23/2020 CLINICAL DATA:  COVID with tachypnea EXAM: PORTABLE CHEST 1 VIEW COMPARISON:   None. FINDINGS: The heart size and mediastinal contours are within normal limits. Aortic atherosclerosis. Both lungs are clear. The visualized skeletal structures are unremarkable. IMPRESSION: No active disease. Electronically Signed   By: Jasmine Pang M.D.   On: 11/23/2020 17:42    Procedures Procedures (including critical care time)  Medications Ordered in ED Medications  famotidine (PEPCID) IVPB 20 mg premix (has no administration in time  range)  ondansetron (ZOFRAN) injection 4 mg (4 mg Intravenous Given 11/23/20 1740)  iohexol (OMNIPAQUE) 300 MG/ML solution 70 mL (70 mLs Intravenous Contrast Given 11/23/20 1843)  ondansetron (ZOFRAN) injection 4 mg (4 mg Intravenous Given 11/23/20 1941)  cefTRIAXone (ROCEPHIN) 1 g in sodium chloride 0.9 % 100 mL IVPB (0 g Intravenous Stopped 11/23/20 2153)  lactated ringers bolus 1,000 mL (1,000 mLs Intravenous New Bag/Given 11/23/20 2153)    ED Course  I have reviewed the triage vital signs and the nursing notes.  Pertinent labs & imaging results that were available during my care of the patient were reviewed by me and considered in my medical decision making (see chart for details).    MDM Rules/Calculators/A&P                         EMYAH ROZNOWSKI is a 86 y.o. female with past medical history of hypertension, hyperlipidemia, CKD stage III, that presents to the emergency department today for nausea, vomiting and diarrhea that began this morning.  Patient did have 1 episode of abdominal pain, however has resolved.  No abdominal tenderness on exam.  Differential diagnoses considered include gastroenteritis, colitis.  Cardiac issues can also be on the differential, patient does have EKG with some new minimal ST depressions interpreted by Dr. Clarice Pole.  Will obtain troponins and blood work at this time.  Initial interventions IV Zofran.  Labs demonstrated unremarkable CBC, very small leukocytosis of 10.6.  CMP remarkable for CO2 of 18, glucose of  141.  Nursing noted that patient was hypoxic to 80 when she was in the room and was placed on 2 L of oxygen.  When I was in the room is able to take patient off of oxygen she was satting 100%, unsure if the 80% reading was accurate.  Patient is not complaining of any shortness of breath.  Chest x-ray clear.  Two negative flat troponin. Lipase normal.  Covid negative.  Urinalysis with concerns for UTI.  CT negative for any acute intra-abdominal pathology.  Upon reassessment patient continues to be nauseous with 2 doses of Zofran, IV fluids and Pepcid initiated. Patient lives alone, will have patient admitted at this time for intractable nausea with UTI. IV antibiotics given for UTI.  Spoke to Dr. Toniann Fail, [facility] who will admit the patient. The patient appears reasonably stabilized for admission considering the current resources, flow, and capabilities available in the ED at this time, and I doubt any other [facility] requiring further screening and/or treatment in the ED prior to admission.  I discussed this case with my attending physician who cosigned this note including patient's presenting symptoms, physical exam, and planned diagnostics and interventions. Attending physician stated agreement with plan or made changes to plan which were implemented.   Attending physician assessed patient at bedside.   Final Clinical Impression(s) / ED Diagnoses Final diagnoses:  Acute cystitis with hematuria  Intractable nausea and vomiting    Rx / DC Orders ED Discharge Orders    None       Farrel Gordon, PA-C 11/23/20 2155    Arby Barrette, MD 11/23/20 2250

## 2020-11-23 NOTE — ED Triage Notes (Signed)
Per ems they were called out for N/v/d  Since 1230 today. She pressed her med alarm so medics came and evaluated her   and she then refused transport. Around 2pm it started back up and wasn't getting better so she called again. Patient not complaining of any abdominal pain. Patient hasn't eaten today. Pt alert and oriented x4.

## 2020-11-23 NOTE — ED Provider Notes (Signed)
Medical screening examination/treatment/procedure(s) were conducted as a shared visit with non-physician practitioner(s) and myself.  I personally evaluated the patient during the encounter.  EKG Interpretation  Date/Time:  Monday November 23 2020 16:40:24 EST Ventricular Rate:  87 PR Interval:    QRS Duration: 99 QT Interval:  397 QTC Calculation: 478 R Axis:   -47 Text Interpretation: Sinus rhythm Left anterior fascicular block Low voltage, precordial leads Minimal ST depression, lateral leads subtle anterolateral ST depression compared to previous Confirmed by Arby Barrette [id number]) on 11/23/2020 4:54:17 PM  Reports she developed copious and acute onset of diarrhea this afternoon.  She reports she had incontinence of stool.  He also got nauseated and had 3 episodes of vomiting.  Patient reports that she was not experiencing chills or abdominal pain.  She has had immunization and booster for Covid.  Reports she feels very dehydrated and weak at this time.  She continues to feel nauseated.  Patient is alert with clear mental status.  No respiratory distress.  Heart regular no gross rub murmur gallop.  Lungs clear.  Abdomen soft without guarding.  No peripheral edema.  Skin warm and dry.  At this time patient presents with acute onset of significant vomiting and diarrhea.  Findings consistent with dehydration.  Also urinalysis is grossly positive for UTI.  Generalized weakness, UTI and ongoing nausea will plan for admission with antibiotics for UTI and rehydration.   Arby Barrette, MD 11/23/20 2250

## 2020-11-23 NOTE — ED Notes (Signed)
Patient transported to CT 

## 2020-11-24 ENCOUNTER — Encounter (HOSPITAL_COMMUNITY): Payer: Self-pay | Admitting: Internal Medicine

## 2020-11-24 DIAGNOSIS — R112 Nausea with vomiting, unspecified: Secondary | ICD-10-CM

## 2020-11-24 DIAGNOSIS — N390 Urinary tract infection, site not specified: Secondary | ICD-10-CM

## 2020-11-24 DIAGNOSIS — N1831 Chronic kidney disease, stage 3a: Secondary | ICD-10-CM | POA: Diagnosis not present

## 2020-11-24 DIAGNOSIS — K529 Noninfective gastroenteritis and colitis, unspecified: Secondary | ICD-10-CM | POA: Diagnosis present

## 2020-11-24 DIAGNOSIS — F32A Depression, unspecified: Secondary | ICD-10-CM | POA: Diagnosis present

## 2020-11-24 LAB — CBC WITH DIFFERENTIAL/PLATELET
Abs Immature Granulocytes: 0.03 10*3/uL (ref 0.00–0.07)
Basophils Absolute: 0 10*3/uL (ref 0.0–0.1)
Basophils Relative: 0 %
Eosinophils Absolute: 0 10*3/uL (ref 0.0–0.5)
Eosinophils Relative: 0 %
HCT: 34.6 % — ABNORMAL LOW (ref 36.0–46.0)
Hemoglobin: 11 g/dL — ABNORMAL LOW (ref 12.0–15.0)
Immature Granulocytes: 1 %
Lymphocytes Relative: 10 %
Lymphs Abs: 0.6 10*3/uL — ABNORMAL LOW (ref 0.7–4.0)
MCH: 30.7 pg (ref 26.0–34.0)
MCHC: 31.8 g/dL (ref 30.0–36.0)
MCV: 96.6 fL (ref 80.0–100.0)
Monocytes Absolute: 0.4 10*3/uL (ref 0.1–1.0)
Monocytes Relative: 6 %
Neutro Abs: 4.9 10*3/uL (ref 1.7–7.7)
Neutrophils Relative %: 83 %
Platelets: 178 10*3/uL (ref 150–400)
RBC: 3.58 MIL/uL — ABNORMAL LOW (ref 3.87–5.11)
RDW: 13.2 % (ref 11.5–15.5)
WBC: 5.9 10*3/uL (ref 4.0–10.5)
nRBC: 0 % (ref 0.0–0.2)

## 2020-11-24 LAB — TROPONIN I (HIGH SENSITIVITY): Troponin I (High Sensitivity): 9 ng/L (ref <18)

## 2020-11-24 LAB — BASIC METABOLIC PANEL
Anion gap: 10 (ref 5–15)
BUN: 16 mg/dL (ref 8–23)
CO2: 22 mmol/L (ref 22–32)
Calcium: 8.3 mg/dL — ABNORMAL LOW (ref 8.9–10.3)
Chloride: 106 mmol/L (ref 98–111)
Creatinine, Ser: 1.12 mg/dL — ABNORMAL HIGH (ref 0.44–1.00)
GFR, Estimated: 47 mL/min — ABNORMAL LOW (ref >60)
Glucose, Bld: 113 mg/dL — ABNORMAL HIGH (ref 70–99)
Potassium: 3.8 mmol/L (ref 3.5–5.1)
Sodium: 138 mmol/L (ref 135–145)

## 2020-11-24 LAB — HEPATIC FUNCTION PANEL
ALT: 13 U/L (ref 0–44)
AST: 20 U/L (ref 15–41)
Albumin: 2.7 g/dL — ABNORMAL LOW (ref 3.5–5.0)
Alkaline Phosphatase: 63 U/L (ref 38–126)
Bilirubin, Direct: 0.2 mg/dL (ref 0.0–0.2)
Indirect Bilirubin: 0.6 mg/dL (ref 0.3–0.9)
Total Bilirubin: 0.8 mg/dL (ref 0.3–1.2)
Total Protein: 5.3 g/dL — ABNORMAL LOW (ref 6.5–8.1)

## 2020-11-24 MED ORDER — PAROXETINE HCL 20 MG PO TABS
20.0000 mg | ORAL_TABLET | Freq: Every day | ORAL | Status: DC
  Administered 2020-11-24: 20 mg via ORAL
  Filled 2020-11-24 (×2): qty 1

## 2020-11-24 MED ORDER — ATORVASTATIN CALCIUM 10 MG PO TABS
10.0000 mg | ORAL_TABLET | Freq: Every day | ORAL | Status: DC
  Administered 2020-11-24: 10 mg via ORAL
  Filled 2020-11-24: qty 1

## 2020-11-24 MED ORDER — ACETAMINOPHEN 650 MG RE SUPP: 650.0000 mg | Freq: Four times a day (QID) | RECTAL | Status: DC | PRN

## 2020-11-24 MED ORDER — ACETAMINOPHEN 325 MG PO TABS: 650.0000 mg | ORAL_TABLET | Freq: Four times a day (QID) | ORAL | Status: DC | PRN

## 2020-11-24 MED ORDER — SULFAMETHOXAZOLE-TRIMETHOPRIM 800-160 MG PO TABS
1.0000 | ORAL_TABLET | Freq: Two times a day (BID) | ORAL | 0 refills | Status: DC
Start: 2020-11-24 — End: 2021-11-05

## 2020-11-24 MED ORDER — SODIUM CHLORIDE 0.9 % IV SOLN: INTRAVENOUS | Status: DC

## 2020-11-24 MED ORDER — SODIUM CHLORIDE 0.9 % IV SOLN: 1.0000 g | INTRAVENOUS | Status: DC

## 2020-11-24 MED ORDER — ENOXAPARIN SODIUM 40 MG/0.4ML ~~LOC~~ SOLN
40.0000 mg | SUBCUTANEOUS | Status: DC
  Administered 2020-11-24: 09:00:00 40 mg via SUBCUTANEOUS
  Filled 2020-11-24: qty 0.4

## 2020-11-24 NOTE — ED Notes (Signed)
Lunch Tray Ordered @ 1029. 

## 2020-11-24 NOTE — Discharge Summary (Signed)
Discharge Summary  Cheryl Peterson TSV:[id number] DOB: 1930/02/28  PCP: Martha Clan, MD  Admit date: 11/23/2020 Discharge date: 11/24/2020  Time spent: 25 minutes  Recommendations for Outpatient Follow-up:  1. New medication: Bactrim DS 1 p.o. twice daily x2 more days 2. Patient will follow up with her PCP Dr. Sherryll Burger in the next month  Discharge Diagnoses:  Active Hospital Problems   Diagnosis Date Noted  . Gastroenteritis 11/24/2020  . Depression 11/24/2020  . Acute lower UTI 11/23/2020  . Intractable nausea and vomiting 02/15/2016  . CKD (chronic kidney disease), stage III (HCC)   . PAF (paroxysmal atrial fibrillation) (HCC)   . Hyperlipidemia     Resolved Hospital Problems  No resolved problems to display.    Discharge Condition: Improved, being discharged home  Diet recommendation: Heart healthy diet  Vitals:   11/24/20 1030 11/24/20 1045  BP: (!) 111/55 (!) 119/57  Pulse: 65 68  Resp: 15 17  Temp:    SpO2: 97% 97%    History of present illness:  86 year old female with past medical history of hyperlipidemia and depression who lives at home and is independent started experiencing nausea and vomiting and diarrhea around noon on 12/27.  Symptoms persisted and so she came into the emergency room for further evaluation.  There she was noted to have a temperature of 100 and a mild leukocytosis and urinalysis positive for UTI.  CT of abdomen pelvis was unremarkable.  Patient given a dose of IV Rocephin and brought in for further observation.  Hospital Course:  Principal Problem:   Gastroenteritis causing intractable nausea and vomiting: Patient was treated with IV fluids.  By morning of 12/28, symptoms have resolved.  She was able to tolerate breakfast without incident.  She said her appetite has come back.  She also tolerated lunch and was able to ambulate on her own.  We will plan to discharge home and have her follow-up with her PCP Active Problems:   Hyperlipidemia:  Continue statin.    CKD (chronic kidney disease), stage III (HCC): Stable.  Creatinine on admission at 1.08 with GFR 49.    Acute lower UTI: Less likely cause of her nausea and vomiting given diarrhea as well.  Nevertheless, with fever and mildly elevated white blood cell count went ahead and treated.  She will get 2 more days of Bactrim to complete 3 days of antibiotic therapy.    Depression: Continue SSRI.   Procedures:  None  Consultations:  None  Discharge Exam: BP (!) 119/57   Pulse 68   Temp 99.1 F (37.3 C)   Resp 17   SpO2 97%   General: Alert and oriented x3, no acute distress Cardiovascular: Regular rate and rhythm, S1-S2 Respiratory: Clear to auscultation bilaterally  Discharge Instructions You were cared for by a hospitalist during your hospital stay. If you have any questions about your discharge medications or the care you received while you were in the hospital after you are discharged, you can call the unit and asked to speak with the hospitalist on call if the hospitalist that took care of you is not available. Once you are discharged, your primary care physician will handle any further medical issues. Please note that NO REFILLS for any discharge medications will be authorized once you are discharged, as it is imperative that you return to your primary care physician (or establish a relationship with a primary care physician if you do not have one) for your aftercare needs so that they can reassess  your need for medications and monitor your lab values.  Discharge Instructions    Diet - low sodium heart healthy   Complete by: As directed    Increase activity slowly   Complete by: As directed      Allergies as of 11/24/2020      Reactions   Penicillins Swelling   Has patient had a PCN reaction causing immediate rash, facial/tongue/throat swelling, SOB or lightheadedness with hypotension:No Has patient had a PCN reaction causing severe rash involving mucus  membranes or skin necrosis:unsure Has patient had a PCN reaction that required hospitalization:Yes Has patient had a PCN reaction occurring within the last 10 years:No If all of the above answers are "NO", then may proceed with Cephalosporin use.      Medication List    TAKE these medications   atorvastatin 10 MG tablet Commonly known as: LIPITOR Take 10 mg by mouth at bedtime.   PARoxetine 20 MG tablet Commonly known as: PAXIL Take 20 mg by mouth every morning.   sulfamethoxazole-trimethoprim 800-160 MG tablet Commonly known as: BACTRIM DS Take 1 tablet by mouth 2 (two) times daily.      Allergies  Allergen Reactions  . Penicillins Swelling    Has patient had a PCN reaction causing immediate rash, facial/tongue/throat swelling, SOB or lightheadedness with hypotension:No Has patient had a PCN reaction causing severe rash involving mucus membranes or skin necrosis:unsure Has patient had a PCN reaction that required hospitalization:Yes Has patient had a PCN reaction occurring within the last 10 years:No If all of the above answers are "NO", then may proceed with Cephalosporin use.     Follow-up Information    Martha ClanShaw, William, MD.   Specialty: Internal Medicine Why: For Follow Up Contact information: [address] [place] [address] [phone]                The results of significant diagnostics from this hospitalization (including imaging, microbiology, ancillary and laboratory) are listed below for reference.    Significant Diagnostic Studies: CT Abdomen Pelvis W Contrast  Result Date: 11/23/2020 CLINICAL DATA:  Abdominal pain, nausea, vomiting, diarrhea. EXAM: CT ABDOMEN AND PELVIS WITH CONTRAST TECHNIQUE: Multidetector CT imaging of the abdomen and pelvis was performed using the standard protocol following bolus administration of intravenous contrast. CONTRAST:  70mL OMNIPAQUE IOHEXOL 300 MG/ML  SOLN COMPARISON:  CT abdomen dated 08/29/2011 FINDINGS:  Lower chest: No acute abnormality. Hepatobiliary: No focal liver abnormality is seen. Gallbladder is unremarkable. Common bile duct is stable in caliber and configuration. Pancreas: Stable low-density focus within the pancreatic head, the nearly 10 year stability indicating benignity. Fatty infiltration of the pancreatic body and tail. No acute findings. Spleen: Normal in size without focal abnormality. Adrenals/Urinary Tract: Adrenal glands are unremarkable. Kidneys are unremarkable without suspicious mass, stone or hydronephrosis. No perinephric fluid. Now ureteral or bladder calculi are identified. Bladder is unremarkable.z Stomach/Bowel: No dilated large or small bowel loops are identified. No evidence of bowel wall inflammation. Appendix is unremarkable. Stomach is unremarkable, partially decompressed. Small hiatal hernia. Diverticulosis of the sigmoid colon but no focal inflammatory change to suggest acute diverticulitis. Vascular/Lymphatic: Aortic atherosclerosis. No acute appearing vascular abnormality. No enlarged lymph nodes are seen in the abdomen or pelvis. Reproductive: Presumed hysterectomy.  No adnexal mass or free fluid. Other: No free fluid or abscess collection. No free intraperitoneal air. Musculoskeletal: Degenerative spondylosis of the thoracolumbar spine, at least moderate in degree. No acute appearing osseous abnormality. IMPRESSION: 1. No acute findings within the abdomen or  pelvis. No bowel obstruction or evidence of bowel wall inflammation. No evidence of acute solid organ abnormality. No renal or ureteral calculi. Appendix is unremarkable. 2. Colonic diverticulosis without evidence of acute diverticulitis. 3. Small hiatal hernia. Aortic Atherosclerosis (ICD10-I70.0). Electronically Signed   By: Bary Richard M.D.   On: 11/23/2020 18:59   DG Chest Port 1 View  Result Date: 11/23/2020 CLINICAL DATA:  COVID with tachypnea EXAM: PORTABLE CHEST 1 VIEW COMPARISON:  None. FINDINGS: The heart  size and mediastinal contours are within normal limits. Aortic atherosclerosis. Both lungs are clear. The visualized skeletal structures are unremarkable. IMPRESSION: No active disease. Electronically Signed   By: Jasmine Pang M.D.   On: 11/23/2020 17:42    Microbiology: Recent Results (from the past 240 hour(s))  Resp Panel by RT-PCR (Flu A&B, Covid) Nasopharyngeal Swab     Status: None   Collection Time: 11/23/20  5:02 PM   Specimen: Nasopharyngeal Swab; Nasopharyngeal(NP) swabs in vial transport medium  Result Value Ref Range Status   SARS Coronavirus 2 by RT PCR NEGATIVE NEGATIVE Final    Comment: (NOTE) SARS-CoV-2 target nucleic acids are NOT DETECTED.  The SARS-CoV-2 RNA is generally detectable in upper respiratory specimens during the acute phase of infection. The lowest concentration of SARS-CoV-2 viral copies this assay can detect is 138 copies/mL. A negative result does not preclude SARS-Cov-2 infection and should not be used as the sole basis for treatment or other patient management decisions. A negative result may occur with  improper specimen collection/handling, submission of specimen other than nasopharyngeal swab, presence of viral mutation(s) within the areas targeted by this assay, and inadequate number of viral copies(<138 copies/mL). A negative result must be combined with clinical observations, patient history, and epidemiological information. The expected result is Negative.  Fact Sheet for Patients:  BloggerCourse.com  Fact Sheet for Healthcare Providers:  SeriousBroker.it  This test is no t yet approved or cleared by the [place] FDA and  has been authorized for detection and/or diagnosis of SARS-CoV-2 by FDA under an Emergency Use Authorization (EUA). This EUA will remain  in effect (meaning this test can be used) for the duration of the COVID-19 declaration under Section 564(b)(1) of the Act,  21 U.S.C.section 360bbb-3(b)(1), unless the authorization is terminated  or revoked sooner.       Influenza A by PCR NEGATIVE NEGATIVE Final   Influenza B by PCR NEGATIVE NEGATIVE Final    Comment: (NOTE) The Xpert Xpress SARS-CoV-2/FLU/RSV plus assay is intended as an aid in the diagnosis of influenza from Nasopharyngeal swab specimens and should not be used as a sole basis for treatment. Nasal washings and aspirates are unacceptable for Xpert Xpress SARS-CoV-2/FLU/RSV testing.  Fact Sheet for Patients: BloggerCourse.com  Fact Sheet for Healthcare Providers: SeriousBroker.it  This test is not yet approved or cleared by the [place] FDA and has been authorized for detection and/or diagnosis of SARS-CoV-2 by FDA under an Emergency Use Authorization (EUA). This EUA will remain in effect (meaning this test can be used) for the duration of the COVID-19 declaration under Section 564(b)(1) of the Act, 21 U.S.C. section 360bbb-3(b)(1), unless the authorization is terminated or revoked.  Performed at [facility], [address], [place], [place] [address]      Labs: Basic Metabolic Panel: Recent Labs  Lab 11/23/20 1653 11/24/20 0549  NA 137 138  K 4.5 3.8  CL 104 106  CO2 18* 22  GLUCOSE 141* 113*  BUN 20 16  CREATININE  1.08* 1.12*  CALCIUM 8.8* 8.3*   Liver Function Tests: Recent Labs  Lab 11/23/20 1653 11/24/20 0549  AST 27 20  ALT 16 13  ALKPHOS 82 63  BILITOT 1.1 0.8  PROT 6.5 5.3*  ALBUMIN 3.6 2.7*   Recent Labs  Lab 11/23/20 1653  LIPASE 20   No results for input(s): AMMONIA in the last 168 hours. CBC: Recent Labs  Lab 11/23/20 1653 11/24/20 0549  WBC 10.6* 5.9  NEUTROABS 9.7* 4.9  HGB 13.7 11.0*  HCT 41.0 34.6*  MCV 95.1 96.6  PLT 207 178   Cardiac Enzymes: No results for input(s): CKTOTAL, CKMB, CKMBINDEX, TROPONINI in the last 168 hours. BNP: BNP (last 3 results) No results  for input(s): BNP in the last 8760 hours.  ProBNP (last 3 results) No results for input(s): PROBNP in the last 8760 hours.  CBG: No results for input(s): GLUCAP in the last 168 hours.     Signed:  Hollice Espy, MD [facility] 11/24/2020, 12:38 PM

## 2020-11-24 NOTE — ED Notes (Signed)
Ms Breakfast Ordered

## 2020-11-24 NOTE — ED Notes (Signed)
Patient verbalizes understanding of discharge instructions. Opportunity for questioning and answers were provided. Armband removed by staff, pt discharged from ED to home via POV with son ?

## 2020-11-24 NOTE — H&P (Signed)
History and Physical    Cheryl Peterson ZOX:[id number] DOB: 08-Jul-1930 DOA: 11/23/2020  PCP: Martha ClanShaw, William, MD  Patient coming from: Home.  Chief Complaint: Nausea vomiting and diarrhea.  HPI: Cheryl Peterson is a 86 y.o. female with history of hyperlipidemia and depression started to experience nausea vomiting and diarrhea yesterday around noon time.  Denies any recent travel sick contacts or use of antibiotics.  Patient abdomen felt mildly bloated otherwise no pain.  Diarrhea was watery with no blood in it and has not had any fever or chills.    ED Course: In the ER patient had a temperature 100 F with labs significant for mild leukocytosis and UA concerning for UTI with CT abdomen pelvis and chest x-ray being unremarkable.  Urine cultures obtained.  Patient was still having some nausea and has been admitted for further observation.  Was started on ceftriaxone for UTI.  Has not had any further episodes of diarrhea in the ER.  Fluid was started.  Review of Systems: As per HPI, rest all negative.   Past Medical History:  Diagnosis Date  . Chest pain   . Decreased diffusion capacity   . Hyperlipidemia   . Hypertension   . SOB (shortness of breath)     Past Surgical History:  Procedure Laterality Date  . TONSILLECTOMY  1939  . VESICOVAGINAL FISTULA CLOSURE W/ TAH  10/28/84     reports that she quit smoking about 42 years ago. She has never used smokeless tobacco. She reports that she does not drink alcohol and does not use drugs.  Allergies  Allergen Reactions  . Penicillins Swelling    Has patient had a PCN reaction causing immediate rash, facial/tongue/throat swelling, SOB or lightheadedness with hypotension:No Has patient had a PCN reaction causing severe rash involving mucus membranes or skin necrosis:unsure Has patient had a PCN reaction that required hospitalization:Yes Has patient had a PCN reaction occurring within the last 10 years:No If all of the above answers are  "NO", then may proceed with Cephalosporin use.     Family History  Problem Relation Age of Onset  . Stroke Mother   . Hypertension Mother   . Deep vein thrombosis Father   . Clotting disorder Sister   . Heart disease Sister   . Heart failure Sister   . Clotting disorder Brother   . Heart disease Brother   . Heart failure Brother   . Colon cancer Neg Hx     Prior to Admission medications   Medication Sig Start Date End Date Taking? Authorizing Provider  atorvastatin (LIPITOR) 10 MG tablet Take 10 mg by mouth at bedtime.   Yes [provider]  PARoxetine (PAXIL) 20 MG tablet Take 20 mg by mouth every morning.   Yes [provider]    Physical Exam: Constitutional: Moderately built and nourished. Vitals:   11/23/20 2345 11/24/20 0000 11/24/20 0015 11/24/20 0030  BP: (!) 125/51 (!) 120/49 (!) 124/54 (!) 125/53  Pulse: 77 76 75 75  Resp: 14 15 14 16   Temp:      TempSrc:      SpO2: 99% 99% 100% 100%   Eyes: Anicteric no pallor. ENMT: No discharge from the ears eyes nose or mouth. Neck: No mass felt.  No neck rigidity. Respiratory: No rhonchi or crepitations. Cardiovascular: S1-S2 heard. Abdomen: Soft nontender bowel sounds present. Musculoskeletal: No edema. Skin: No rash. Neurologic: Alert awake oriented to time place and person.  Moves all extremities. Psychiatric: Appears normal.  Normal affect.   Labs on Admission: I have personally reviewed following labs and imaging studies  CBC: Recent Labs  Lab 11/23/20 1653  WBC 10.6*  NEUTROABS 9.7*  HGB 13.7  HCT 41.0  MCV 95.1  PLT 207   Basic Metabolic Panel: Recent Labs  Lab 11/23/20 1653  NA 137  K 4.5  CL 104  CO2 18*  GLUCOSE 141*  BUN 20  CREATININE 1.08*  CALCIUM 8.8*   GFR: CrCl cannot be calculated (Unknown ideal weight.). Liver Function Tests: Recent Labs  Lab 11/23/20 1653  AST 27  ALT 16  ALKPHOS 82  BILITOT 1.1  PROT 6.5  ALBUMIN 3.6   Recent Labs  Lab  11/23/20 1653  LIPASE 20   No results for input(s): AMMONIA in the last 168 hours. Coagulation Profile: No results for input(s): INR, PROTIME in the last 168 hours. Cardiac Enzymes: No results for input(s): CKTOTAL, CKMB, CKMBINDEX, TROPONINI in the last 168 hours. BNP (last 3 results) No results for input(s): PROBNP in the last 8760 hours. HbA1C: No results for input(s): HGBA1C in the last 72 hours. CBG: No results for input(s): GLUCAP in the last 168 hours. Lipid Profile: No results for input(s): CHOL, HDL, LDLCALC, TRIG, CHOLHDL, LDLDIRECT in the last 72 hours. Thyroid Function Tests: No results for input(s): TSH, T4TOTAL, FREET4, T3FREE, THYROIDAB in the last 72 hours. Anemia Panel: No results for input(s): VITAMINB12, FOLATE, FERRITIN, TIBC, IRON, RETICCTPCT in the last 72 hours. Urine analysis:    Component Value Date/Time   COLORURINE YELLOW 11/23/2020 1945   APPEARANCEUR CLEAR 11/23/2020 1945   LABSPEC 1.026 11/23/2020 1945   PHURINE 5.0 11/23/2020 1945   GLUCOSEU NEGATIVE 11/23/2020 1945   HGBUR SMALL (A) 11/23/2020 1945   BILIRUBINUR NEGATIVE 11/23/2020 1945   KETONESUR NEGATIVE 11/23/2020 1945   PROTEINUR NEGATIVE 11/23/2020 1945   UROBILINOGEN 0.2 08/29/2011 2145   NITRITE NEGATIVE 11/23/2020 1945   LEUKOCYTESUR LARGE (A) 11/23/2020 1945   Sepsis Labs: @LABRCNTIP (procalcitonin:4,lacticidven:4) ) Recent Results (from the past 240 hour(s))  Resp Panel by RT-PCR (Flu A&B, Covid) Nasopharyngeal Swab     Status: None   Collection Time: 11/23/20  5:02 PM   Specimen: Nasopharyngeal Swab; Nasopharyngeal(NP) swabs in vial transport medium  Result Value Ref Range Status   SARS Coronavirus 2 by RT PCR NEGATIVE NEGATIVE Final    Comment: (NOTE) SARS-CoV-2 target nucleic acids are NOT DETECTED.  The SARS-CoV-2 RNA is generally detectable in upper respiratory specimens during the acute phase of infection. The lowest concentration of SARS-CoV-2 viral copies this assay  can detect is 138 copies/mL. A negative result does not preclude SARS-Cov-2 infection and should not be used as the sole basis for treatment or other patient management decisions. A negative result may occur with  improper specimen collection/handling, submission of specimen other than nasopharyngeal swab, presence of viral mutation(s) within the areas targeted by this assay, and inadequate number of viral copies(<138 copies/mL). A negative result must be combined with clinical observations, patient history, and epidemiological information. The expected result is Negative.  Fact Sheet for Patients:  11/25/20  Fact Sheet for Healthcare Providers:  BloggerCourse.com  This test is no t yet approved or cleared by the [place] FDA and  has been authorized for detection and/or diagnosis of SARS-CoV-2 by FDA under an Emergency Use Authorization (EUA). This EUA will remain  in effect (meaning this test can be used) for the duration of the COVID-19 declaration under Section 564(b)(1) of the Act, 21 U.S.C.section 360bbb-3(b)(1),  unless the authorization is terminated  or revoked sooner.       Influenza A by PCR NEGATIVE NEGATIVE Final   Influenza B by PCR NEGATIVE NEGATIVE Final    Comment: (NOTE) The Xpert Xpress SARS-CoV-2/FLU/RSV plus assay is intended as an aid in the diagnosis of influenza from Nasopharyngeal swab specimens and should not be used as a sole basis for treatment. Nasal washings and aspirates are unacceptable for Xpert Xpress SARS-CoV-2/FLU/RSV testing.  Fact Sheet for Patients: BloggerCourse.com  Fact Sheet for Healthcare Providers: Serious[employer]ker.it  This test is not yet approved or cleared by the [place] FDA and has been authorized for detection and/or diagnosis of SARS-CoV-2 by FDA under an Emergency Use Authorization (EUA). This EUA will  remain in effect (meaning this test can be used) for the duration of the COVID-19 declaration under Section 564(b)(1) of the Act, 21 U.S.C. section 360bbb-3(b)(1), unless the authorization is terminated or revoked.  Performed at [facility], [address], [place], [place] [address]      Radiological Exams on Admission: CT Abdomen Pelvis W Contrast  Result Date: 11/23/2020 CLINICAL DATA:  Abdominal pain, nausea, vomiting, diarrhea. EXAM: CT ABDOMEN AND PELVIS WITH CONTRAST TECHNIQUE: Multidetector CT imaging of the abdomen and pelvis was performed using the standard protocol following bolus administration of intravenous contrast. CONTRAST:  35mL OMNIPAQUE IOHEXOL 300 MG/ML  SOLN COMPARISON:  CT abdomen dated 08/29/2011 FINDINGS: Lower chest: No acute abnormality. Hepatobiliary: No focal liver abnormality is seen. Gallbladder is unremarkable. Common bile duct is stable in caliber and configuration. Pancreas: Stable low-density focus within the pancreatic head, the nearly 10 year stability indicating benignity. Fatty infiltration of the pancreatic body and tail. No acute findings. Spleen: Normal in size without focal abnormality. Adrenals/Urinary Tract: Adrenal glands are unremarkable. Kidneys are unremarkable without suspicious mass, stone or hydronephrosis. No perinephric fluid. Now ureteral or bladder calculi are identified. Bladder is unremarkable.z Stomach/Bowel: No dilated large or small bowel loops are identified. No evidence of bowel wall inflammation. Appendix is unremarkable. Stomach is unremarkable, partially decompressed. Small hiatal hernia. Diverticulosis of the sigmoid colon but no focal inflammatory change to suggest acute diverticulitis. Vascular/Lymphatic: Aortic atherosclerosis. No acute appearing vascular abnormality. No enlarged lymph nodes are seen in the abdomen or pelvis. Reproductive: Presumed hysterectomy.  No adnexal mass or free fluid. Other: No free fluid or abscess  collection. No free intraperitoneal air. Musculoskeletal: Degenerative spondylosis of the thoracolumbar spine, at least moderate in degree. No acute appearing osseous abnormality. IMPRESSION: 1. No acute findings within the abdomen or pelvis. No bowel obstruction or evidence of bowel wall inflammation. No evidence of acute solid organ abnormality. No renal or ureteral calculi. Appendix is unremarkable. 2. Colonic diverticulosis without evidence of acute diverticulitis. 3. Small hiatal hernia. Aortic Atherosclerosis (ICD10-I70.0). Electronically Signed   By: Bary Richard M.D.   On: 11/23/2020 18:59   DG Chest Port 1 View  Result Date: 11/23/2020 CLINICAL DATA:  COVID with tachypnea EXAM: PORTABLE CHEST 1 VIEW COMPARISON:  None. FINDINGS: The heart size and mediastinal contours are within normal limits. Aortic atherosclerosis. Both lungs are clear. The visualized skeletal structures are unremarkable. IMPRESSION: No active disease. Electronically Signed   By: Jasmine Pang M.D.   On: 11/23/2020 17:42    EKG: Independently reviewed.  Normal sinus rhythm with ST depressions in the lateral leads.  Assessment/Plan Principal Problem:   Nausea & vomiting Active Problems:   PAF (paroxysmal atrial fibrillation) (HCC)   Intractable nausea and vomiting  CKD (chronic kidney disease), stage III (HCC)    1. Intractable nausea vomiting and diarrhea could be from gastroenteritis.  Symptoms are improving.  If there is any further episodes of diarrhea will get stool studies.  Denies using any recent antibiotics no recent travel or sick contacts. 2. Possible UTI on ceftriaxone follow urine cultures. 3. Chronic kidney disease stage III creatinine appears to be at baseline. 4. Hyperlipidemia on statins. 5. History of depression on Paxil.   DVT prophylaxis: Lovenox. Code Status: Full code.   Family Communication: Discussed with patient. Disposition Plan: Home. Consults called: None. Admission status:  Observation.   Eduard Clos MD [facility] Pager [phone].  If 7PM-7AM, please contact night-coverage [email or web address] Password [facility]  11/24/2020, 12:41 AM

## 2020-11-24 NOTE — ED Notes (Signed)
Assisted pt to dress.

## 2020-11-24 NOTE — ED Notes (Signed)
Unable to reach son

## 2020-11-24 NOTE — ED Notes (Signed)
Patient denies pain and is resting comfortably. Eating lunch and drinking fluids without issue. Unable to reach son for pick up from ED. Discharge instructions and education performed with patient. No questions, verbalizes understanding.

## 2020-11-25 LAB — URINE CULTURE: Culture: <10000 — AB

## 2020-12-03 DIAGNOSIS — H35031 Hypertensive retinopathy, right eye: Secondary | ICD-10-CM | POA: Diagnosis not present

## 2020-12-03 DIAGNOSIS — H35423 Microcystoid degeneration of retina, bilateral: Secondary | ICD-10-CM | POA: Diagnosis not present

## 2020-12-03 DIAGNOSIS — H43813 Vitreous degeneration, bilateral: Secondary | ICD-10-CM | POA: Diagnosis not present

## 2020-12-03 DIAGNOSIS — H353232 Exudative age-related macular degeneration, bilateral, with inactive choroidal neovascularization: Secondary | ICD-10-CM | POA: Diagnosis not present

## 2021-02-01 DIAGNOSIS — Z20828 Contact with and (suspected) exposure to other viral communicable diseases: Secondary | ICD-10-CM | POA: Diagnosis not present

## 2021-02-08 DIAGNOSIS — Z20828 Contact with and (suspected) exposure to other viral communicable diseases: Secondary | ICD-10-CM | POA: Diagnosis not present

## 2021-02-15 DIAGNOSIS — Z20828 Contact with and (suspected) exposure to other viral communicable diseases: Secondary | ICD-10-CM | POA: Diagnosis not present

## 2021-02-22 DIAGNOSIS — Z20828 Contact with and (suspected) exposure to other viral communicable diseases: Secondary | ICD-10-CM | POA: Diagnosis not present

## 2021-03-29 DIAGNOSIS — Z20828 Contact with and (suspected) exposure to other viral communicable diseases: Secondary | ICD-10-CM | POA: Diagnosis not present

## 2021-04-02 DIAGNOSIS — M6281 Muscle weakness (generalized): Secondary | ICD-10-CM | POA: Diagnosis not present

## 2021-04-05 DIAGNOSIS — R262 Difficulty in walking, not elsewhere classified: Secondary | ICD-10-CM | POA: Diagnosis not present

## 2021-04-05 DIAGNOSIS — R278 Other lack of coordination: Secondary | ICD-10-CM | POA: Diagnosis not present

## 2021-04-05 DIAGNOSIS — R2681 Unsteadiness on feet: Secondary | ICD-10-CM | POA: Diagnosis not present

## 2021-04-05 DIAGNOSIS — Z20828 Contact with and (suspected) exposure to other viral communicable diseases: Secondary | ICD-10-CM | POA: Diagnosis not present

## 2021-04-05 DIAGNOSIS — M6281 Muscle weakness (generalized): Secondary | ICD-10-CM | POA: Diagnosis not present

## 2021-04-06 DIAGNOSIS — M6281 Muscle weakness (generalized): Secondary | ICD-10-CM | POA: Diagnosis not present

## 2021-04-07 DIAGNOSIS — M6281 Muscle weakness (generalized): Secondary | ICD-10-CM | POA: Diagnosis not present

## 2021-04-08 DIAGNOSIS — R2681 Unsteadiness on feet: Secondary | ICD-10-CM | POA: Diagnosis not present

## 2021-04-08 DIAGNOSIS — M6281 Muscle weakness (generalized): Secondary | ICD-10-CM | POA: Diagnosis not present

## 2021-04-08 DIAGNOSIS — R41841 Cognitive communication deficit: Secondary | ICD-10-CM | POA: Diagnosis not present

## 2021-04-08 DIAGNOSIS — R488 Other symbolic dysfunctions: Secondary | ICD-10-CM | POA: Diagnosis not present

## 2021-04-08 DIAGNOSIS — R262 Difficulty in walking, not elsewhere classified: Secondary | ICD-10-CM | POA: Diagnosis not present

## 2021-04-08 DIAGNOSIS — R278 Other lack of coordination: Secondary | ICD-10-CM | POA: Diagnosis not present

## 2021-04-09 DIAGNOSIS — R262 Difficulty in walking, not elsewhere classified: Secondary | ICD-10-CM | POA: Diagnosis not present

## 2021-04-09 DIAGNOSIS — R278 Other lack of coordination: Secondary | ICD-10-CM | POA: Diagnosis not present

## 2021-04-09 DIAGNOSIS — R41841 Cognitive communication deficit: Secondary | ICD-10-CM | POA: Diagnosis not present

## 2021-04-09 DIAGNOSIS — R488 Other symbolic dysfunctions: Secondary | ICD-10-CM | POA: Diagnosis not present

## 2021-04-09 DIAGNOSIS — M6281 Muscle weakness (generalized): Secondary | ICD-10-CM | POA: Diagnosis not present

## 2021-04-09 DIAGNOSIS — R2681 Unsteadiness on feet: Secondary | ICD-10-CM | POA: Diagnosis not present

## 2021-04-12 DIAGNOSIS — Z20828 Contact with and (suspected) exposure to other viral communicable diseases: Secondary | ICD-10-CM | POA: Diagnosis not present

## 2021-04-12 DIAGNOSIS — M6281 Muscle weakness (generalized): Secondary | ICD-10-CM | POA: Diagnosis not present

## 2021-04-14 DIAGNOSIS — R488 Other symbolic dysfunctions: Secondary | ICD-10-CM | POA: Diagnosis not present

## 2021-04-14 DIAGNOSIS — R41841 Cognitive communication deficit: Secondary | ICD-10-CM | POA: Diagnosis not present

## 2021-04-14 DIAGNOSIS — M6281 Muscle weakness (generalized): Secondary | ICD-10-CM | POA: Diagnosis not present

## 2021-04-15 DIAGNOSIS — R2681 Unsteadiness on feet: Secondary | ICD-10-CM | POA: Diagnosis not present

## 2021-04-15 DIAGNOSIS — M6281 Muscle weakness (generalized): Secondary | ICD-10-CM | POA: Diagnosis not present

## 2021-04-15 DIAGNOSIS — R262 Difficulty in walking, not elsewhere classified: Secondary | ICD-10-CM | POA: Diagnosis not present

## 2021-04-15 DIAGNOSIS — R278 Other lack of coordination: Secondary | ICD-10-CM | POA: Diagnosis not present

## 2021-04-16 DIAGNOSIS — R41841 Cognitive communication deficit: Secondary | ICD-10-CM | POA: Diagnosis not present

## 2021-04-16 DIAGNOSIS — R488 Other symbolic dysfunctions: Secondary | ICD-10-CM | POA: Diagnosis not present

## 2021-04-19 DIAGNOSIS — Z20828 Contact with and (suspected) exposure to other viral communicable diseases: Secondary | ICD-10-CM | POA: Diagnosis not present

## 2021-04-19 DIAGNOSIS — R278 Other lack of coordination: Secondary | ICD-10-CM | POA: Diagnosis not present

## 2021-04-19 DIAGNOSIS — R2681 Unsteadiness on feet: Secondary | ICD-10-CM | POA: Diagnosis not present

## 2021-04-19 DIAGNOSIS — R262 Difficulty in walking, not elsewhere classified: Secondary | ICD-10-CM | POA: Diagnosis not present

## 2021-04-19 DIAGNOSIS — M6281 Muscle weakness (generalized): Secondary | ICD-10-CM | POA: Diagnosis not present

## 2021-04-21 DIAGNOSIS — R41841 Cognitive communication deficit: Secondary | ICD-10-CM | POA: Diagnosis not present

## 2021-04-21 DIAGNOSIS — R488 Other symbolic dysfunctions: Secondary | ICD-10-CM | POA: Diagnosis not present

## 2021-04-21 DIAGNOSIS — M6281 Muscle weakness (generalized): Secondary | ICD-10-CM | POA: Diagnosis not present

## 2021-04-22 DIAGNOSIS — R278 Other lack of coordination: Secondary | ICD-10-CM | POA: Diagnosis not present

## 2021-04-22 DIAGNOSIS — R262 Difficulty in walking, not elsewhere classified: Secondary | ICD-10-CM | POA: Diagnosis not present

## 2021-04-22 DIAGNOSIS — M6281 Muscle weakness (generalized): Secondary | ICD-10-CM | POA: Diagnosis not present

## 2021-04-22 DIAGNOSIS — R2681 Unsteadiness on feet: Secondary | ICD-10-CM | POA: Diagnosis not present

## 2021-04-23 DIAGNOSIS — M6281 Muscle weakness (generalized): Secondary | ICD-10-CM | POA: Diagnosis not present

## 2021-04-23 DIAGNOSIS — R278 Other lack of coordination: Secondary | ICD-10-CM | POA: Diagnosis not present

## 2021-04-23 DIAGNOSIS — R2681 Unsteadiness on feet: Secondary | ICD-10-CM | POA: Diagnosis not present

## 2021-04-23 DIAGNOSIS — R41841 Cognitive communication deficit: Secondary | ICD-10-CM | POA: Diagnosis not present

## 2021-04-23 DIAGNOSIS — R488 Other symbolic dysfunctions: Secondary | ICD-10-CM | POA: Diagnosis not present

## 2021-04-23 DIAGNOSIS — R262 Difficulty in walking, not elsewhere classified: Secondary | ICD-10-CM | POA: Diagnosis not present

## 2021-04-26 DIAGNOSIS — Z20828 Contact with and (suspected) exposure to other viral communicable diseases: Secondary | ICD-10-CM | POA: Diagnosis not present

## 2021-04-28 DIAGNOSIS — R278 Other lack of coordination: Secondary | ICD-10-CM | POA: Diagnosis not present

## 2021-04-28 DIAGNOSIS — R262 Difficulty in walking, not elsewhere classified: Secondary | ICD-10-CM | POA: Diagnosis not present

## 2021-04-28 DIAGNOSIS — M6281 Muscle weakness (generalized): Secondary | ICD-10-CM | POA: Diagnosis not present

## 2021-04-28 DIAGNOSIS — R2681 Unsteadiness on feet: Secondary | ICD-10-CM | POA: Diagnosis not present

## 2021-04-29 DIAGNOSIS — M6281 Muscle weakness (generalized): Secondary | ICD-10-CM | POA: Diagnosis not present

## 2021-04-29 DIAGNOSIS — R262 Difficulty in walking, not elsewhere classified: Secondary | ICD-10-CM | POA: Diagnosis not present

## 2021-04-29 DIAGNOSIS — R2681 Unsteadiness on feet: Secondary | ICD-10-CM | POA: Diagnosis not present

## 2021-04-29 DIAGNOSIS — R278 Other lack of coordination: Secondary | ICD-10-CM | POA: Diagnosis not present

## 2021-04-30 DIAGNOSIS — M6281 Muscle weakness (generalized): Secondary | ICD-10-CM | POA: Diagnosis not present

## 2021-04-30 DIAGNOSIS — R262 Difficulty in walking, not elsewhere classified: Secondary | ICD-10-CM | POA: Diagnosis not present

## 2021-04-30 DIAGNOSIS — R278 Other lack of coordination: Secondary | ICD-10-CM | POA: Diagnosis not present

## 2021-04-30 DIAGNOSIS — R2681 Unsteadiness on feet: Secondary | ICD-10-CM | POA: Diagnosis not present

## 2021-05-03 DIAGNOSIS — M6281 Muscle weakness (generalized): Secondary | ICD-10-CM | POA: Diagnosis not present

## 2021-05-03 DIAGNOSIS — R278 Other lack of coordination: Secondary | ICD-10-CM | POA: Diagnosis not present

## 2021-05-03 DIAGNOSIS — R262 Difficulty in walking, not elsewhere classified: Secondary | ICD-10-CM | POA: Diagnosis not present

## 2021-05-03 DIAGNOSIS — R2681 Unsteadiness on feet: Secondary | ICD-10-CM | POA: Diagnosis not present

## 2021-05-03 DIAGNOSIS — Z20828 Contact with and (suspected) exposure to other viral communicable diseases: Secondary | ICD-10-CM | POA: Diagnosis not present

## 2021-05-05 DIAGNOSIS — M6281 Muscle weakness (generalized): Secondary | ICD-10-CM | POA: Diagnosis not present

## 2021-05-05 DIAGNOSIS — R262 Difficulty in walking, not elsewhere classified: Secondary | ICD-10-CM | POA: Diagnosis not present

## 2021-05-05 DIAGNOSIS — R278 Other lack of coordination: Secondary | ICD-10-CM | POA: Diagnosis not present

## 2021-05-05 DIAGNOSIS — R488 Other symbolic dysfunctions: Secondary | ICD-10-CM | POA: Diagnosis not present

## 2021-05-05 DIAGNOSIS — R41841 Cognitive communication deficit: Secondary | ICD-10-CM | POA: Diagnosis not present

## 2021-05-05 DIAGNOSIS — R2681 Unsteadiness on feet: Secondary | ICD-10-CM | POA: Diagnosis not present

## 2021-05-07 DIAGNOSIS — R278 Other lack of coordination: Secondary | ICD-10-CM | POA: Diagnosis not present

## 2021-05-07 DIAGNOSIS — R41841 Cognitive communication deficit: Secondary | ICD-10-CM | POA: Diagnosis not present

## 2021-05-07 DIAGNOSIS — R488 Other symbolic dysfunctions: Secondary | ICD-10-CM | POA: Diagnosis not present

## 2021-05-07 DIAGNOSIS — R2681 Unsteadiness on feet: Secondary | ICD-10-CM | POA: Diagnosis not present

## 2021-05-07 DIAGNOSIS — M6281 Muscle weakness (generalized): Secondary | ICD-10-CM | POA: Diagnosis not present

## 2021-05-07 DIAGNOSIS — R262 Difficulty in walking, not elsewhere classified: Secondary | ICD-10-CM | POA: Diagnosis not present

## 2021-05-10 DIAGNOSIS — M6281 Muscle weakness (generalized): Secondary | ICD-10-CM | POA: Diagnosis not present

## 2021-05-10 DIAGNOSIS — R2681 Unsteadiness on feet: Secondary | ICD-10-CM | POA: Diagnosis not present

## 2021-05-10 DIAGNOSIS — R278 Other lack of coordination: Secondary | ICD-10-CM | POA: Diagnosis not present

## 2021-05-10 DIAGNOSIS — Z20828 Contact with and (suspected) exposure to other viral communicable diseases: Secondary | ICD-10-CM | POA: Diagnosis not present

## 2021-05-10 DIAGNOSIS — R262 Difficulty in walking, not elsewhere classified: Secondary | ICD-10-CM | POA: Diagnosis not present

## 2021-05-12 DIAGNOSIS — R41841 Cognitive communication deficit: Secondary | ICD-10-CM | POA: Diagnosis not present

## 2021-05-12 DIAGNOSIS — R488 Other symbolic dysfunctions: Secondary | ICD-10-CM | POA: Diagnosis not present

## 2021-05-12 DIAGNOSIS — R2681 Unsteadiness on feet: Secondary | ICD-10-CM | POA: Diagnosis not present

## 2021-05-12 DIAGNOSIS — R278 Other lack of coordination: Secondary | ICD-10-CM | POA: Diagnosis not present

## 2021-05-12 DIAGNOSIS — M6281 Muscle weakness (generalized): Secondary | ICD-10-CM | POA: Diagnosis not present

## 2021-05-12 DIAGNOSIS — R262 Difficulty in walking, not elsewhere classified: Secondary | ICD-10-CM | POA: Diagnosis not present

## 2021-05-13 DIAGNOSIS — Z20828 Contact with and (suspected) exposure to other viral communicable diseases: Secondary | ICD-10-CM | POA: Diagnosis not present

## 2021-05-14 DIAGNOSIS — R278 Other lack of coordination: Secondary | ICD-10-CM | POA: Diagnosis not present

## 2021-05-14 DIAGNOSIS — R262 Difficulty in walking, not elsewhere classified: Secondary | ICD-10-CM | POA: Diagnosis not present

## 2021-05-14 DIAGNOSIS — R41841 Cognitive communication deficit: Secondary | ICD-10-CM | POA: Diagnosis not present

## 2021-05-14 DIAGNOSIS — M6281 Muscle weakness (generalized): Secondary | ICD-10-CM | POA: Diagnosis not present

## 2021-05-14 DIAGNOSIS — R488 Other symbolic dysfunctions: Secondary | ICD-10-CM | POA: Diagnosis not present

## 2021-05-14 DIAGNOSIS — R2681 Unsteadiness on feet: Secondary | ICD-10-CM | POA: Diagnosis not present

## 2021-05-17 DIAGNOSIS — R278 Other lack of coordination: Secondary | ICD-10-CM | POA: Diagnosis not present

## 2021-05-17 DIAGNOSIS — R2681 Unsteadiness on feet: Secondary | ICD-10-CM | POA: Diagnosis not present

## 2021-05-17 DIAGNOSIS — M6281 Muscle weakness (generalized): Secondary | ICD-10-CM | POA: Diagnosis not present

## 2021-05-17 DIAGNOSIS — R262 Difficulty in walking, not elsewhere classified: Secondary | ICD-10-CM | POA: Diagnosis not present

## 2021-05-19 DIAGNOSIS — R41841 Cognitive communication deficit: Secondary | ICD-10-CM | POA: Diagnosis not present

## 2021-05-19 DIAGNOSIS — R262 Difficulty in walking, not elsewhere classified: Secondary | ICD-10-CM | POA: Diagnosis not present

## 2021-05-19 DIAGNOSIS — R278 Other lack of coordination: Secondary | ICD-10-CM | POA: Diagnosis not present

## 2021-05-19 DIAGNOSIS — M6281 Muscle weakness (generalized): Secondary | ICD-10-CM | POA: Diagnosis not present

## 2021-05-19 DIAGNOSIS — R2681 Unsteadiness on feet: Secondary | ICD-10-CM | POA: Diagnosis not present

## 2021-05-19 DIAGNOSIS — R488 Other symbolic dysfunctions: Secondary | ICD-10-CM | POA: Diagnosis not present

## 2021-05-20 DIAGNOSIS — Z20828 Contact with and (suspected) exposure to other viral communicable diseases: Secondary | ICD-10-CM | POA: Diagnosis not present

## 2021-05-21 DIAGNOSIS — R2681 Unsteadiness on feet: Secondary | ICD-10-CM | POA: Diagnosis not present

## 2021-05-21 DIAGNOSIS — M6281 Muscle weakness (generalized): Secondary | ICD-10-CM | POA: Diagnosis not present

## 2021-05-21 DIAGNOSIS — R262 Difficulty in walking, not elsewhere classified: Secondary | ICD-10-CM | POA: Diagnosis not present

## 2021-05-21 DIAGNOSIS — R278 Other lack of coordination: Secondary | ICD-10-CM | POA: Diagnosis not present

## 2021-05-21 DIAGNOSIS — R41841 Cognitive communication deficit: Secondary | ICD-10-CM | POA: Diagnosis not present

## 2021-05-21 DIAGNOSIS — R488 Other symbolic dysfunctions: Secondary | ICD-10-CM | POA: Diagnosis not present

## 2021-05-24 DIAGNOSIS — R278 Other lack of coordination: Secondary | ICD-10-CM | POA: Diagnosis not present

## 2021-05-24 DIAGNOSIS — M6281 Muscle weakness (generalized): Secondary | ICD-10-CM | POA: Diagnosis not present

## 2021-05-24 DIAGNOSIS — R2681 Unsteadiness on feet: Secondary | ICD-10-CM | POA: Diagnosis not present

## 2021-05-24 DIAGNOSIS — Z20828 Contact with and (suspected) exposure to other viral communicable diseases: Secondary | ICD-10-CM | POA: Diagnosis not present

## 2021-05-24 DIAGNOSIS — R262 Difficulty in walking, not elsewhere classified: Secondary | ICD-10-CM | POA: Diagnosis not present

## 2021-05-26 DIAGNOSIS — M6281 Muscle weakness (generalized): Secondary | ICD-10-CM | POA: Diagnosis not present

## 2021-05-26 DIAGNOSIS — R488 Other symbolic dysfunctions: Secondary | ICD-10-CM | POA: Diagnosis not present

## 2021-05-26 DIAGNOSIS — R41841 Cognitive communication deficit: Secondary | ICD-10-CM | POA: Diagnosis not present

## 2021-05-27 DIAGNOSIS — M6281 Muscle weakness (generalized): Secondary | ICD-10-CM | POA: Diagnosis not present

## 2021-05-27 DIAGNOSIS — R262 Difficulty in walking, not elsewhere classified: Secondary | ICD-10-CM | POA: Diagnosis not present

## 2021-05-27 DIAGNOSIS — R278 Other lack of coordination: Secondary | ICD-10-CM | POA: Diagnosis not present

## 2021-05-27 DIAGNOSIS — R2681 Unsteadiness on feet: Secondary | ICD-10-CM | POA: Diagnosis not present

## 2021-05-27 DIAGNOSIS — Z20828 Contact with and (suspected) exposure to other viral communicable diseases: Secondary | ICD-10-CM | POA: Diagnosis not present

## 2021-05-28 DIAGNOSIS — R278 Other lack of coordination: Secondary | ICD-10-CM | POA: Diagnosis not present

## 2021-05-28 DIAGNOSIS — R488 Other symbolic dysfunctions: Secondary | ICD-10-CM | POA: Diagnosis not present

## 2021-05-28 DIAGNOSIS — R2681 Unsteadiness on feet: Secondary | ICD-10-CM | POA: Diagnosis not present

## 2021-05-28 DIAGNOSIS — R262 Difficulty in walking, not elsewhere classified: Secondary | ICD-10-CM | POA: Diagnosis not present

## 2021-05-28 DIAGNOSIS — M6281 Muscle weakness (generalized): Secondary | ICD-10-CM | POA: Diagnosis not present

## 2021-05-28 DIAGNOSIS — R41841 Cognitive communication deficit: Secondary | ICD-10-CM | POA: Diagnosis not present

## 2021-05-31 DIAGNOSIS — M6281 Muscle weakness (generalized): Secondary | ICD-10-CM | POA: Diagnosis not present

## 2021-06-02 DIAGNOSIS — R262 Difficulty in walking, not elsewhere classified: Secondary | ICD-10-CM | POA: Diagnosis not present

## 2021-06-02 DIAGNOSIS — M6281 Muscle weakness (generalized): Secondary | ICD-10-CM | POA: Diagnosis not present

## 2021-06-02 DIAGNOSIS — R278 Other lack of coordination: Secondary | ICD-10-CM | POA: Diagnosis not present

## 2021-06-02 DIAGNOSIS — R2681 Unsteadiness on feet: Secondary | ICD-10-CM | POA: Diagnosis not present

## 2021-06-03 DIAGNOSIS — R262 Difficulty in walking, not elsewhere classified: Secondary | ICD-10-CM | POA: Diagnosis not present

## 2021-06-03 DIAGNOSIS — H43813 Vitreous degeneration, bilateral: Secondary | ICD-10-CM | POA: Diagnosis not present

## 2021-06-03 DIAGNOSIS — H353112 Nonexudative age-related macular degeneration, right eye, intermediate dry stage: Secondary | ICD-10-CM | POA: Diagnosis not present

## 2021-06-03 DIAGNOSIS — R488 Other symbolic dysfunctions: Secondary | ICD-10-CM | POA: Diagnosis not present

## 2021-06-03 DIAGNOSIS — M6281 Muscle weakness (generalized): Secondary | ICD-10-CM | POA: Diagnosis not present

## 2021-06-03 DIAGNOSIS — R278 Other lack of coordination: Secondary | ICD-10-CM | POA: Diagnosis not present

## 2021-06-03 DIAGNOSIS — R41841 Cognitive communication deficit: Secondary | ICD-10-CM | POA: Diagnosis not present

## 2021-06-03 DIAGNOSIS — R2681 Unsteadiness on feet: Secondary | ICD-10-CM | POA: Diagnosis not present

## 2021-06-03 DIAGNOSIS — H35433 Paving stone degeneration of retina, bilateral: Secondary | ICD-10-CM | POA: Diagnosis not present

## 2021-06-03 DIAGNOSIS — H353222 Exudative age-related macular degeneration, left eye, with inactive choroidal neovascularization: Secondary | ICD-10-CM | POA: Diagnosis not present

## 2021-06-03 DIAGNOSIS — Z20828 Contact with and (suspected) exposure to other viral communicable diseases: Secondary | ICD-10-CM | POA: Diagnosis not present

## 2021-06-04 DIAGNOSIS — M6281 Muscle weakness (generalized): Secondary | ICD-10-CM | POA: Diagnosis not present

## 2021-06-04 DIAGNOSIS — R2681 Unsteadiness on feet: Secondary | ICD-10-CM | POA: Diagnosis not present

## 2021-06-04 DIAGNOSIS — R262 Difficulty in walking, not elsewhere classified: Secondary | ICD-10-CM | POA: Diagnosis not present

## 2021-06-04 DIAGNOSIS — R278 Other lack of coordination: Secondary | ICD-10-CM | POA: Diagnosis not present

## 2021-06-04 DIAGNOSIS — Z1231 Encounter for screening mammogram for malignant neoplasm of breast: Secondary | ICD-10-CM | POA: Diagnosis not present

## 2021-06-07 DIAGNOSIS — M6281 Muscle weakness (generalized): Secondary | ICD-10-CM | POA: Diagnosis not present

## 2021-06-07 DIAGNOSIS — Z20828 Contact with and (suspected) exposure to other viral communicable diseases: Secondary | ICD-10-CM | POA: Diagnosis not present

## 2021-06-09 DIAGNOSIS — M6281 Muscle weakness (generalized): Secondary | ICD-10-CM | POA: Diagnosis not present

## 2021-06-09 DIAGNOSIS — R2681 Unsteadiness on feet: Secondary | ICD-10-CM | POA: Diagnosis not present

## 2021-06-09 DIAGNOSIS — R41841 Cognitive communication deficit: Secondary | ICD-10-CM | POA: Diagnosis not present

## 2021-06-09 DIAGNOSIS — R262 Difficulty in walking, not elsewhere classified: Secondary | ICD-10-CM | POA: Diagnosis not present

## 2021-06-09 DIAGNOSIS — R278 Other lack of coordination: Secondary | ICD-10-CM | POA: Diagnosis not present

## 2021-06-09 DIAGNOSIS — R488 Other symbolic dysfunctions: Secondary | ICD-10-CM | POA: Diagnosis not present

## 2021-06-10 DIAGNOSIS — R278 Other lack of coordination: Secondary | ICD-10-CM | POA: Diagnosis not present

## 2021-06-10 DIAGNOSIS — R262 Difficulty in walking, not elsewhere classified: Secondary | ICD-10-CM | POA: Diagnosis not present

## 2021-06-10 DIAGNOSIS — M6281 Muscle weakness (generalized): Secondary | ICD-10-CM | POA: Diagnosis not present

## 2021-06-10 DIAGNOSIS — Z20828 Contact with and (suspected) exposure to other viral communicable diseases: Secondary | ICD-10-CM | POA: Diagnosis not present

## 2021-06-10 DIAGNOSIS — R41841 Cognitive communication deficit: Secondary | ICD-10-CM | POA: Diagnosis not present

## 2021-06-10 DIAGNOSIS — R2681 Unsteadiness on feet: Secondary | ICD-10-CM | POA: Diagnosis not present

## 2021-06-10 DIAGNOSIS — R488 Other symbolic dysfunctions: Secondary | ICD-10-CM | POA: Diagnosis not present

## 2021-06-11 DIAGNOSIS — R278 Other lack of coordination: Secondary | ICD-10-CM | POA: Diagnosis not present

## 2021-06-11 DIAGNOSIS — M6281 Muscle weakness (generalized): Secondary | ICD-10-CM | POA: Diagnosis not present

## 2021-06-11 DIAGNOSIS — R262 Difficulty in walking, not elsewhere classified: Secondary | ICD-10-CM | POA: Diagnosis not present

## 2021-06-11 DIAGNOSIS — R2681 Unsteadiness on feet: Secondary | ICD-10-CM | POA: Diagnosis not present

## 2021-06-14 DIAGNOSIS — R278 Other lack of coordination: Secondary | ICD-10-CM | POA: Diagnosis not present

## 2021-06-14 DIAGNOSIS — R262 Difficulty in walking, not elsewhere classified: Secondary | ICD-10-CM | POA: Diagnosis not present

## 2021-06-14 DIAGNOSIS — Z20828 Contact with and (suspected) exposure to other viral communicable diseases: Secondary | ICD-10-CM | POA: Diagnosis not present

## 2021-06-14 DIAGNOSIS — R2681 Unsteadiness on feet: Secondary | ICD-10-CM | POA: Diagnosis not present

## 2021-06-14 DIAGNOSIS — M6281 Muscle weakness (generalized): Secondary | ICD-10-CM | POA: Diagnosis not present

## 2021-06-16 DIAGNOSIS — M6281 Muscle weakness (generalized): Secondary | ICD-10-CM | POA: Diagnosis not present

## 2021-06-16 DIAGNOSIS — R488 Other symbolic dysfunctions: Secondary | ICD-10-CM | POA: Diagnosis not present

## 2021-06-16 DIAGNOSIS — R41841 Cognitive communication deficit: Secondary | ICD-10-CM | POA: Diagnosis not present

## 2021-06-17 DIAGNOSIS — Z20828 Contact with and (suspected) exposure to other viral communicable diseases: Secondary | ICD-10-CM | POA: Diagnosis not present

## 2021-06-18 DIAGNOSIS — R41841 Cognitive communication deficit: Secondary | ICD-10-CM | POA: Diagnosis not present

## 2021-06-18 DIAGNOSIS — M6281 Muscle weakness (generalized): Secondary | ICD-10-CM | POA: Diagnosis not present

## 2021-06-18 DIAGNOSIS — R278 Other lack of coordination: Secondary | ICD-10-CM | POA: Diagnosis not present

## 2021-06-18 DIAGNOSIS — R2681 Unsteadiness on feet: Secondary | ICD-10-CM | POA: Diagnosis not present

## 2021-06-18 DIAGNOSIS — R488 Other symbolic dysfunctions: Secondary | ICD-10-CM | POA: Diagnosis not present

## 2021-06-18 DIAGNOSIS — R262 Difficulty in walking, not elsewhere classified: Secondary | ICD-10-CM | POA: Diagnosis not present

## 2021-06-24 DIAGNOSIS — Z20828 Contact with and (suspected) exposure to other viral communicable diseases: Secondary | ICD-10-CM | POA: Diagnosis not present

## 2021-06-25 DIAGNOSIS — R2681 Unsteadiness on feet: Secondary | ICD-10-CM | POA: Diagnosis not present

## 2021-06-25 DIAGNOSIS — R278 Other lack of coordination: Secondary | ICD-10-CM | POA: Diagnosis not present

## 2021-06-25 DIAGNOSIS — R262 Difficulty in walking, not elsewhere classified: Secondary | ICD-10-CM | POA: Diagnosis not present

## 2021-06-25 DIAGNOSIS — M6281 Muscle weakness (generalized): Secondary | ICD-10-CM | POA: Diagnosis not present

## 2021-06-30 DIAGNOSIS — R2681 Unsteadiness on feet: Secondary | ICD-10-CM | POA: Diagnosis not present

## 2021-06-30 DIAGNOSIS — M6281 Muscle weakness (generalized): Secondary | ICD-10-CM | POA: Diagnosis not present

## 2021-06-30 DIAGNOSIS — R262 Difficulty in walking, not elsewhere classified: Secondary | ICD-10-CM | POA: Diagnosis not present

## 2021-06-30 DIAGNOSIS — R278 Other lack of coordination: Secondary | ICD-10-CM | POA: Diagnosis not present

## 2021-07-01 DIAGNOSIS — Z20828 Contact with and (suspected) exposure to other viral communicable diseases: Secondary | ICD-10-CM | POA: Diagnosis not present

## 2021-07-01 DIAGNOSIS — R2681 Unsteadiness on feet: Secondary | ICD-10-CM | POA: Diagnosis not present

## 2021-07-01 DIAGNOSIS — M6281 Muscle weakness (generalized): Secondary | ICD-10-CM | POA: Diagnosis not present

## 2021-07-01 DIAGNOSIS — R278 Other lack of coordination: Secondary | ICD-10-CM | POA: Diagnosis not present

## 2021-07-01 DIAGNOSIS — R262 Difficulty in walking, not elsewhere classified: Secondary | ICD-10-CM | POA: Diagnosis not present

## 2021-07-02 DIAGNOSIS — R278 Other lack of coordination: Secondary | ICD-10-CM | POA: Diagnosis not present

## 2021-07-02 DIAGNOSIS — R262 Difficulty in walking, not elsewhere classified: Secondary | ICD-10-CM | POA: Diagnosis not present

## 2021-07-02 DIAGNOSIS — M6281 Muscle weakness (generalized): Secondary | ICD-10-CM | POA: Diagnosis not present

## 2021-07-02 DIAGNOSIS — R2681 Unsteadiness on feet: Secondary | ICD-10-CM | POA: Diagnosis not present

## 2021-07-05 DIAGNOSIS — R2681 Unsteadiness on feet: Secondary | ICD-10-CM | POA: Diagnosis not present

## 2021-07-05 DIAGNOSIS — R262 Difficulty in walking, not elsewhere classified: Secondary | ICD-10-CM | POA: Diagnosis not present

## 2021-07-05 DIAGNOSIS — M6281 Muscle weakness (generalized): Secondary | ICD-10-CM | POA: Diagnosis not present

## 2021-07-05 DIAGNOSIS — R278 Other lack of coordination: Secondary | ICD-10-CM | POA: Diagnosis not present

## 2021-07-05 DIAGNOSIS — Z20828 Contact with and (suspected) exposure to other viral communicable diseases: Secondary | ICD-10-CM | POA: Diagnosis not present

## 2021-07-06 DIAGNOSIS — R262 Difficulty in walking, not elsewhere classified: Secondary | ICD-10-CM | POA: Diagnosis not present

## 2021-07-06 DIAGNOSIS — M6281 Muscle weakness (generalized): Secondary | ICD-10-CM | POA: Diagnosis not present

## 2021-07-06 DIAGNOSIS — R7301 Impaired fasting glucose: Secondary | ICD-10-CM | POA: Diagnosis not present

## 2021-07-06 DIAGNOSIS — E785 Hyperlipidemia, unspecified: Secondary | ICD-10-CM | POA: Diagnosis not present

## 2021-07-06 DIAGNOSIS — R2681 Unsteadiness on feet: Secondary | ICD-10-CM | POA: Diagnosis not present

## 2021-07-06 DIAGNOSIS — R278 Other lack of coordination: Secondary | ICD-10-CM | POA: Diagnosis not present

## 2021-07-12 DIAGNOSIS — H35373 Puckering of macula, bilateral: Secondary | ICD-10-CM | POA: Diagnosis not present

## 2021-07-12 DIAGNOSIS — Z20828 Contact with and (suspected) exposure to other viral communicable diseases: Secondary | ICD-10-CM | POA: Diagnosis not present

## 2021-07-12 DIAGNOSIS — H04123 Dry eye syndrome of bilateral lacrimal glands: Secondary | ICD-10-CM | POA: Diagnosis not present

## 2021-07-12 DIAGNOSIS — H53483 Generalized contraction of visual field, bilateral: Secondary | ICD-10-CM | POA: Diagnosis not present

## 2021-07-12 DIAGNOSIS — H353231 Exudative age-related macular degeneration, bilateral, with active choroidal neovascularization: Secondary | ICD-10-CM | POA: Diagnosis not present

## 2021-07-13 DIAGNOSIS — I129 Hypertensive chronic kidney disease with stage 1 through stage 4 chronic kidney disease, or unspecified chronic kidney disease: Secondary | ICD-10-CM | POA: Diagnosis not present

## 2021-07-13 DIAGNOSIS — Z0000 Encounter for general adult medical examination without abnormal findings: Secondary | ICD-10-CM | POA: Diagnosis not present

## 2021-07-13 DIAGNOSIS — N1831 Chronic kidney disease, stage 3a: Secondary | ICD-10-CM | POA: Diagnosis not present

## 2021-07-13 DIAGNOSIS — I739 Peripheral vascular disease, unspecified: Secondary | ICD-10-CM | POA: Diagnosis not present

## 2021-07-13 DIAGNOSIS — E038 Other specified hypothyroidism: Secondary | ICD-10-CM | POA: Diagnosis not present

## 2021-07-13 DIAGNOSIS — F3342 Major depressive disorder, recurrent, in full remission: Secondary | ICD-10-CM | POA: Diagnosis not present

## 2021-07-13 DIAGNOSIS — I6509 Occlusion and stenosis of unspecified vertebral artery: Secondary | ICD-10-CM | POA: Diagnosis not present

## 2021-07-13 DIAGNOSIS — R221 Localized swelling, mass and lump, neck: Secondary | ICD-10-CM | POA: Diagnosis not present

## 2021-07-13 DIAGNOSIS — E785 Hyperlipidemia, unspecified: Secondary | ICD-10-CM | POA: Diagnosis not present

## 2021-07-14 DIAGNOSIS — R82998 Other abnormal findings in urine: Secondary | ICD-10-CM | POA: Diagnosis not present

## 2021-07-22 DIAGNOSIS — Z8616 Personal history of COVID-19: Secondary | ICD-10-CM | POA: Diagnosis not present

## 2021-07-29 DIAGNOSIS — Z8616 Personal history of COVID-19: Secondary | ICD-10-CM | POA: Diagnosis not present

## 2021-08-05 DIAGNOSIS — Z8616 Personal history of COVID-19: Secondary | ICD-10-CM | POA: Diagnosis not present

## 2021-08-12 DIAGNOSIS — Z8616 Personal history of COVID-19: Secondary | ICD-10-CM | POA: Diagnosis not present

## 2021-08-18 DIAGNOSIS — Z823 Family history of stroke: Secondary | ICD-10-CM | POA: Diagnosis not present

## 2021-08-18 DIAGNOSIS — R32 Unspecified urinary incontinence: Secondary | ICD-10-CM | POA: Diagnosis not present

## 2021-08-18 DIAGNOSIS — Z6827 Body mass index (BMI) 27.0-27.9, adult: Secondary | ICD-10-CM | POA: Diagnosis not present

## 2021-08-18 DIAGNOSIS — F419 Anxiety disorder, unspecified: Secondary | ICD-10-CM | POA: Diagnosis not present

## 2021-08-18 DIAGNOSIS — I739 Peripheral vascular disease, unspecified: Secondary | ICD-10-CM | POA: Diagnosis not present

## 2021-08-18 DIAGNOSIS — F325 Major depressive disorder, single episode, in full remission: Secondary | ICD-10-CM | POA: Diagnosis not present

## 2021-08-18 DIAGNOSIS — E663 Overweight: Secondary | ICD-10-CM | POA: Diagnosis not present

## 2021-08-18 DIAGNOSIS — R030 Elevated blood-pressure reading, without diagnosis of hypertension: Secondary | ICD-10-CM | POA: Diagnosis not present

## 2021-08-18 DIAGNOSIS — E785 Hyperlipidemia, unspecified: Secondary | ICD-10-CM | POA: Diagnosis not present

## 2021-08-19 DIAGNOSIS — Z20828 Contact with and (suspected) exposure to other viral communicable diseases: Secondary | ICD-10-CM | POA: Diagnosis not present

## 2021-09-02 DIAGNOSIS — Z8616 Personal history of COVID-19: Secondary | ICD-10-CM | POA: Diagnosis not present

## 2021-09-13 DIAGNOSIS — Z20828 Contact with and (suspected) exposure to other viral communicable diseases: Secondary | ICD-10-CM | POA: Diagnosis not present

## 2021-10-29 ENCOUNTER — Emergency Department (HOSPITAL_COMMUNITY): Payer: Medicare PPO

## 2021-10-29 ENCOUNTER — Encounter (HOSPITAL_COMMUNITY): Payer: Self-pay

## 2021-10-29 ENCOUNTER — Other Ambulatory Visit: Payer: Self-pay

## 2021-10-29 ENCOUNTER — Inpatient Hospital Stay (HOSPITAL_COMMUNITY)
Admission: EM | Admit: 2021-10-29 | Discharge: 2021-11-10 | DRG: 200 | Disposition: A | Discharge status: Skilled Nursing Facility | Payer: Medicare PPO | Attending: General Surgery | Admitting: General Surgery

## 2021-10-29 DIAGNOSIS — S2249XA Multiple fractures of ribs, unspecified side, initial encounter for closed fracture: Secondary | ICD-10-CM

## 2021-10-29 DIAGNOSIS — S270XXA Traumatic pneumothorax, initial encounter: Secondary | ICD-10-CM | POA: Diagnosis not present

## 2021-10-29 DIAGNOSIS — S2243XA Multiple fractures of ribs, bilateral, initial encounter for closed fracture: Secondary | ICD-10-CM

## 2021-10-29 DIAGNOSIS — I129 Hypertensive chronic kidney disease with stage 1 through stage 4 chronic kidney disease, or unspecified chronic kidney disease: Secondary | ICD-10-CM | POA: Diagnosis present

## 2021-10-29 DIAGNOSIS — Z87891 Personal history of nicotine dependence: Secondary | ICD-10-CM

## 2021-10-29 DIAGNOSIS — J939 Pneumothorax, unspecified: Secondary | ICD-10-CM | POA: Diagnosis not present

## 2021-10-29 DIAGNOSIS — R0902 Hypoxemia: Secondary | ICD-10-CM

## 2021-10-29 DIAGNOSIS — R739 Hyperglycemia, unspecified: Secondary | ICD-10-CM | POA: Diagnosis present

## 2021-10-29 DIAGNOSIS — Z041 Encounter for examination and observation following transport accident: Secondary | ICD-10-CM | POA: Diagnosis not present

## 2021-10-29 DIAGNOSIS — R064 Hyperventilation: Secondary | ICD-10-CM | POA: Diagnosis present

## 2021-10-29 DIAGNOSIS — I4510 Unspecified right bundle-branch block: Secondary | ICD-10-CM | POA: Diagnosis present

## 2021-10-29 DIAGNOSIS — S2242XD Multiple fractures of ribs, left side, subsequent encounter for fracture with routine healing: Secondary | ICD-10-CM | POA: Diagnosis not present

## 2021-10-29 DIAGNOSIS — Z20822 Contact with and (suspected) exposure to covid-19: Secondary | ICD-10-CM | POA: Diagnosis present

## 2021-10-29 DIAGNOSIS — Z743 Need for continuous supervision: Secondary | ICD-10-CM | POA: Diagnosis not present

## 2021-10-29 DIAGNOSIS — E785 Hyperlipidemia, unspecified: Secondary | ICD-10-CM | POA: Diagnosis present

## 2021-10-29 DIAGNOSIS — R079 Chest pain, unspecified: Secondary | ICD-10-CM | POA: Diagnosis not present

## 2021-10-29 DIAGNOSIS — N1831 Chronic kidney disease, stage 3a: Secondary | ICD-10-CM | POA: Diagnosis present

## 2021-10-29 DIAGNOSIS — R7989 Other specified abnormal findings of blood chemistry: Secondary | ICD-10-CM | POA: Diagnosis not present

## 2021-10-29 DIAGNOSIS — D62 Acute posthemorrhagic anemia: Secondary | ICD-10-CM | POA: Diagnosis not present

## 2021-10-29 DIAGNOSIS — J90 Pleural effusion, not elsewhere classified: Secondary | ICD-10-CM | POA: Diagnosis not present

## 2021-10-29 DIAGNOSIS — I700 Atherosclerosis of aorta: Secondary | ICD-10-CM | POA: Diagnosis not present

## 2021-10-29 DIAGNOSIS — F064 Anxiety disorder due to known physiological condition: Secondary | ICD-10-CM | POA: Diagnosis present

## 2021-10-29 DIAGNOSIS — R9431 Abnormal electrocardiogram [ECG] [EKG]: Secondary | ICD-10-CM | POA: Diagnosis not present

## 2021-10-29 DIAGNOSIS — Z8249 Family history of ischemic heart disease and other diseases of the circulatory system: Secondary | ICD-10-CM

## 2021-10-29 DIAGNOSIS — N179 Acute kidney failure, unspecified: Secondary | ICD-10-CM | POA: Diagnosis not present

## 2021-10-29 DIAGNOSIS — R531 Weakness: Secondary | ICD-10-CM | POA: Diagnosis not present

## 2021-10-29 DIAGNOSIS — Z8709 Personal history of other diseases of the respiratory system: Secondary | ICD-10-CM | POA: Diagnosis not present

## 2021-10-29 DIAGNOSIS — S2220XA Unspecified fracture of sternum, initial encounter for closed fracture: Secondary | ICD-10-CM | POA: Diagnosis present

## 2021-10-29 DIAGNOSIS — Y92410 Unspecified street and highway as the place of occurrence of the external cause: Secondary | ICD-10-CM | POA: Diagnosis not present

## 2021-10-29 DIAGNOSIS — I5031 Acute diastolic (congestive) heart failure: Secondary | ICD-10-CM | POA: Diagnosis not present

## 2021-10-29 DIAGNOSIS — K5900 Constipation, unspecified: Secondary | ICD-10-CM | POA: Diagnosis present

## 2021-10-29 DIAGNOSIS — R109 Unspecified abdominal pain: Secondary | ICD-10-CM

## 2021-10-29 DIAGNOSIS — I4891 Unspecified atrial fibrillation: Secondary | ICD-10-CM | POA: Diagnosis present

## 2021-10-29 DIAGNOSIS — S2241XD Multiple fractures of ribs, right side, subsequent encounter for fracture with routine healing: Secondary | ICD-10-CM | POA: Diagnosis not present

## 2021-10-29 DIAGNOSIS — R000 Tachycardia, unspecified: Secondary | ICD-10-CM | POA: Diagnosis present

## 2021-10-29 DIAGNOSIS — R7401 Elevation of levels of liver transaminase levels: Secondary | ICD-10-CM | POA: Diagnosis present

## 2021-10-29 DIAGNOSIS — I484 Atypical atrial flutter: Secondary | ICD-10-CM | POA: Diagnosis not present

## 2021-10-29 DIAGNOSIS — S20219A Contusion of unspecified front wall of thorax, initial encounter: Secondary | ICD-10-CM | POA: Diagnosis not present

## 2021-10-29 DIAGNOSIS — J811 Chronic pulmonary edema: Secondary | ICD-10-CM | POA: Diagnosis present

## 2021-10-29 DIAGNOSIS — R0789 Other chest pain: Secondary | ICD-10-CM | POA: Diagnosis not present

## 2021-10-29 DIAGNOSIS — Z79899 Other long term (current) drug therapy: Secondary | ICD-10-CM

## 2021-10-29 DIAGNOSIS — J9811 Atelectasis: Secondary | ICD-10-CM | POA: Diagnosis not present

## 2021-10-29 DIAGNOSIS — S2220XD Unspecified fracture of sternum, subsequent encounter for fracture with routine healing: Secondary | ICD-10-CM | POA: Diagnosis not present

## 2021-10-29 DIAGNOSIS — I2510 Atherosclerotic heart disease of native coronary artery without angina pectoris: Secondary | ICD-10-CM | POA: Diagnosis not present

## 2021-10-29 DIAGNOSIS — F05 Delirium due to known physiological condition: Secondary | ICD-10-CM | POA: Diagnosis not present

## 2021-10-29 DIAGNOSIS — Z880 Allergy status to penicillin: Secondary | ICD-10-CM

## 2021-10-29 DIAGNOSIS — R5381 Other malaise: Secondary | ICD-10-CM | POA: Diagnosis not present

## 2021-10-29 DIAGNOSIS — S3991XA Unspecified injury of abdomen, initial encounter: Secondary | ICD-10-CM | POA: Diagnosis not present

## 2021-10-29 DIAGNOSIS — E871 Hypo-osmolality and hyponatremia: Secondary | ICD-10-CM | POA: Diagnosis present

## 2021-10-29 DIAGNOSIS — K5730 Diverticulosis of large intestine without perforation or abscess without bleeding: Secondary | ICD-10-CM | POA: Diagnosis not present

## 2021-10-29 DIAGNOSIS — S0990XA Unspecified injury of head, initial encounter: Secondary | ICD-10-CM | POA: Diagnosis not present

## 2021-10-29 DIAGNOSIS — S2241XA Multiple fractures of ribs, right side, initial encounter for closed fracture: Secondary | ICD-10-CM | POA: Diagnosis not present

## 2021-10-29 LAB — I-STAT CHEM 8, ED
BUN: 25 mg/dL — ABNORMAL HIGH (ref 8–23)
Calcium, Ion: 1.15 mmol/L (ref 1.15–1.40)
Chloride: 99 mmol/L (ref 98–111)
Creatinine, Ser: 1.4 mg/dL — ABNORMAL HIGH (ref 0.44–1.00)
Glucose, Bld: 155 mg/dL — ABNORMAL HIGH (ref 70–99)
HCT: 39 % (ref 36.0–46.0)
Hemoglobin: 13.3 g/dL (ref 12.0–15.0)
Potassium: 4.4 mmol/L (ref 3.5–5.1)
Sodium: 137 mmol/L (ref 135–145)
TCO2: 29 mmol/L (ref 22–32)

## 2021-10-29 LAB — RESP PANEL BY RT-PCR (FLU A&B, COVID) ARPGX2
Influenza A by PCR: NEGATIVE
Influenza B by PCR: NEGATIVE
SARS Coronavirus 2 by RT PCR: NEGATIVE

## 2021-10-29 LAB — PROTIME-INR
INR: 1 (ref 0.8–1.2)
Prothrombin Time: 13.5 seconds (ref 11.4–15.2)

## 2021-10-29 LAB — URINALYSIS, ROUTINE W REFLEX MICROSCOPIC
Bacteria, UA: NONE SEEN
Bilirubin Urine: NEGATIVE
Glucose, UA: NEGATIVE mg/dL
Ketones, ur: NEGATIVE mg/dL
Leukocytes,Ua: NEGATIVE
Nitrite: NEGATIVE
Protein, ur: NEGATIVE mg/dL
Specific Gravity, Urine: 1.019 (ref 1.005–1.030)
pH: 7 (ref 5.0–8.0)

## 2021-10-29 LAB — CBC
HCT: 39 % (ref 36.0–46.0)
Hemoglobin: 12.8 g/dL (ref 12.0–15.0)
MCH: 31.4 pg (ref 26.0–34.0)
MCHC: 32.8 g/dL (ref 30.0–36.0)
MCV: 95.6 fL (ref 80.0–100.0)
Platelets: 261 10*3/uL (ref 150–400)
RBC: 4.08 MIL/uL (ref 3.87–5.11)
RDW: 13.4 % (ref 11.5–15.5)
WBC: 16.7 10*3/uL — ABNORMAL HIGH (ref 4.0–10.5)
nRBC: 0 % (ref 0.0–0.2)

## 2021-10-29 LAB — COMPREHENSIVE METABOLIC PANEL
ALT: 37 U/L (ref 0–44)
AST: 79 U/L — ABNORMAL HIGH (ref 15–41)
Albumin: 3.8 g/dL (ref 3.5–5.0)
Alkaline Phosphatase: 73 U/L (ref 38–126)
Anion gap: 9 (ref 5–15)
BUN: 27 mg/dL — ABNORMAL HIGH (ref 8–23)
CO2: 27 mmol/L (ref 22–32)
Calcium: 8.6 mg/dL — ABNORMAL LOW (ref 8.9–10.3)
Chloride: 97 mmol/L — ABNORMAL LOW (ref 98–111)
Creatinine, Ser: 1.24 mg/dL — ABNORMAL HIGH (ref 0.44–1.00)
GFR, Estimated: 42 mL/min — ABNORMAL LOW (ref >60)
Glucose, Bld: 158 mg/dL — ABNORMAL HIGH (ref 70–99)
Potassium: 4.3 mmol/L (ref 3.5–5.1)
Sodium: 133 mmol/L — ABNORMAL LOW (ref 135–145)
Total Bilirubin: 0.8 mg/dL (ref 0.3–1.2)
Total Protein: 6.8 g/dL (ref 6.5–8.1)

## 2021-10-29 LAB — TROPONIN I (HIGH SENSITIVITY): Troponin I (High Sensitivity): 179 ng/L (ref <18)

## 2021-10-29 LAB — LACTIC ACID, PLASMA: Lactic Acid, Venous: 1.8 mmol/L (ref 0.5–1.9)

## 2021-10-29 LAB — ETHANOL: Alcohol, Ethyl (B): <10 mg/dL (ref <10)

## 2021-10-29 LAB — SAMPLE TO BLOOD BANK

## 2021-10-29 MED ORDER — MORPHINE SULFATE (PF) 2 MG/ML IV SOLN
2.0000 mg | Freq: Once | INTRAVENOUS | Status: AC
  Administered 2021-10-29: 2 mg via INTRAVENOUS
  Filled 2021-10-29: qty 1

## 2021-10-29 MED ORDER — IOHEXOL 350 MG/ML SOLN
75.0000 mL | Freq: Once | INTRAVENOUS | Status: AC | PRN
  Administered 2021-10-29: 60 mL via INTRAVENOUS

## 2021-10-29 MED ORDER — ONDANSETRON HCL 4 MG/2ML IJ SOLN
4.0000 mg | Freq: Once | INTRAMUSCULAR | Status: AC
  Administered 2021-10-29: 4 mg via INTRAVENOUS
  Filled 2021-10-29: qty 2

## 2021-10-29 MED ORDER — SODIUM CHLORIDE 0.9 % IV BOLUS
250.0000 mL | Freq: Once | INTRAVENOUS | Status: AC
  Administered 2021-10-29: 250 mL via INTRAVENOUS
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
## 2021-10-29 NOTE — ED Notes (Signed)
Purewick placed on pt. 
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
## 2021-10-30 ENCOUNTER — Inpatient Hospital Stay (HOSPITAL_COMMUNITY): Payer: Medicare PPO

## 2021-10-30 LAB — COMPREHENSIVE METABOLIC PANEL
ALT: 48 U/L — ABNORMAL HIGH (ref 0–44)
AST: 111 U/L — ABNORMAL HIGH (ref 15–41)
Albumin: 3.2 g/dL — ABNORMAL LOW (ref 3.5–5.0)
Alkaline Phosphatase: 69 U/L (ref 38–126)
Anion gap: 9 (ref 5–15)
BUN: 17 mg/dL (ref 8–23)
CO2: 22 mmol/L (ref 22–32)
Calcium: 8.6 mg/dL — ABNORMAL LOW (ref 8.9–10.3)
Chloride: 105 mmol/L (ref 98–111)
Creatinine, Ser: 1.17 mg/dL — ABNORMAL HIGH (ref 0.44–1.00)
GFR, Estimated: 45 mL/min — ABNORMAL LOW (ref >60)
Glucose, Bld: 156 mg/dL — ABNORMAL HIGH (ref 70–99)
Potassium: 4.7 mmol/L (ref 3.5–5.1)
Sodium: 136 mmol/L (ref 135–145)
Total Bilirubin: 0.9 mg/dL (ref 0.3–1.2)
Total Protein: 6.4 g/dL — ABNORMAL LOW (ref 6.5–8.1)

## 2021-10-30 LAB — CBC
HCT: 39.5 % (ref 36.0–46.0)
Hemoglobin: 12.9 g/dL (ref 12.0–15.0)
MCH: 31.7 pg (ref 26.0–34.0)
MCHC: 32.7 g/dL (ref 30.0–36.0)
MCV: 97.1 fL (ref 80.0–100.0)
Platelets: 247 10*3/uL (ref 150–400)
RBC: 4.07 MIL/uL (ref 3.87–5.11)
RDW: 13.3 % (ref 11.5–15.5)
WBC: 10.3 10*3/uL (ref 4.0–10.5)
nRBC: 0 % (ref 0.0–0.2)

## 2021-10-30 LAB — TROPONIN I (HIGH SENSITIVITY)
Troponin I (High Sensitivity): 187 ng/L (ref <18)
Troponin I (High Sensitivity): 79 ng/L — ABNORMAL HIGH (ref <18)

## 2021-10-30 MED ORDER — ACETAMINOPHEN 325 MG PO TABS: 650.0000 mg | ORAL_TABLET | ORAL | Status: DC | PRN

## 2021-10-30 MED ORDER — ONDANSETRON 4 MG PO TBDP: 4.0000 mg | ORAL_TABLET | Freq: Four times a day (QID) | ORAL | Status: DC | PRN

## 2021-10-30 MED ORDER — DOCUSATE SODIUM 100 MG PO CAPS
100.0000 mg | ORAL_CAPSULE | Freq: Two times a day (BID) | ORAL | Status: DC
  Administered 2021-10-30 – 2021-11-03 (×9): 100 mg via ORAL
  Filled 2021-10-30 (×10): qty 1

## 2021-10-30 MED ORDER — BISACODYL 10 MG RE SUPP
10.0000 mg | Freq: Every day | RECTAL | Status: DC | PRN
  Administered 2021-11-04: 10 mg via RECTAL
  Filled 2021-10-30: qty 1

## 2021-10-30 MED ORDER — ENOXAPARIN SODIUM 30 MG/0.3ML IJ SOSY
30.0000 mg | PREFILLED_SYRINGE | INTRAMUSCULAR | Status: DC
  Administered 2021-10-30 – 2021-11-04 (×6): 30 mg via SUBCUTANEOUS
  Filled 2021-10-30 (×6): qty 0.3

## 2021-10-30 MED ORDER — ONDANSETRON HCL 4 MG/2ML IJ SOLN
4.0000 mg | Freq: Four times a day (QID) | INTRAMUSCULAR | Status: DC | PRN
  Administered 2021-10-30 – 2021-11-05 (×3): 4 mg via INTRAVENOUS
  Filled 2021-10-30 (×3): qty 2

## 2021-10-30 MED ORDER — SODIUM CHLORIDE 0.9% FLUSH: 3.0000 mL | INTRAVENOUS | Status: DC | PRN

## 2021-10-30 MED ORDER — TRAMADOL HCL 50 MG PO TABS
50.0000 mg | ORAL_TABLET | Freq: Four times a day (QID) | ORAL | Status: DC | PRN
  Administered 2021-10-30 – 2021-11-02 (×5): 50 mg via ORAL
  Filled 2021-10-30 (×5): qty 1

## 2021-10-30 MED ORDER — LACTATED RINGERS IV SOLN: INTRAVENOUS | Status: DC

## 2021-10-30 MED ORDER — ADULT MULTIVITAMIN W/MINERALS CH
1.0000 | ORAL_TABLET | Freq: Every day | ORAL | Status: DC
  Administered 2021-10-30 – 2021-11-10 (×12): 1 via ORAL
  Filled 2021-10-30 (×12): qty 1

## 2021-10-30 MED ORDER — ACETAMINOPHEN 500 MG PO TABS
1000.0000 mg | ORAL_TABLET | Freq: Three times a day (TID) | ORAL | Status: DC
  Administered 2021-10-30 – 2021-11-10 (×31): 1000 mg via ORAL
  Filled 2021-10-30 (×32): qty 2

## 2021-10-30 MED ORDER — SODIUM CHLORIDE 0.9% FLUSH
3.0000 mL | Freq: Two times a day (BID) | INTRAVENOUS | Status: DC
  Administered 2021-10-30 – 2021-11-10 (×21): 3 mL via INTRAVENOUS

## 2021-10-30 MED ORDER — SODIUM CHLORIDE 0.9 % IV SOLN: 250.0000 mL | INTRAVENOUS | Status: DC | PRN

## 2021-10-30 MED ORDER — PAROXETINE HCL 20 MG PO TABS
20.0000 mg | ORAL_TABLET | Freq: Every day | ORAL | Status: DC
  Administered 2021-10-30 – 2021-11-10 (×12): 20 mg via ORAL
  Filled 2021-10-30 (×13): qty 1

## 2021-10-30 MED ORDER — LIDOCAINE 5 % EX PTCH
1.0000 | MEDICATED_PATCH | CUTANEOUS | Status: DC
Start: 2021-10-30 — End: 2021-11-10
  Administered 2021-10-30 – 2021-11-10 (×12): 1 via TRANSDERMAL
  Filled 2021-10-30 (×11): qty 1

## 2021-10-30 MED ORDER — HYDROCODONE-ACETAMINOPHEN 5-325 MG PO TABS
1.0000 | ORAL_TABLET | ORAL | Status: DC | PRN
  Administered 2021-10-30: 1 via ORAL
  Filled 2021-10-30: qty 1

## 2021-10-30 MED ORDER — FENTANYL CITRATE PF 50 MCG/ML IJ SOSY
25.0000 ug | PREFILLED_SYRINGE | Freq: Once | INTRAMUSCULAR | Status: AC
  Administered 2021-10-30: 25 ug via INTRAVENOUS
  Filled 2021-10-30: qty 1

## 2021-10-30 MED ORDER — FENTANYL CITRATE PF 50 MCG/ML IJ SOSY
25.0000 ug | PREFILLED_SYRINGE | INTRAMUSCULAR | Status: DC | PRN
  Administered 2021-10-30 – 2021-10-31 (×3): 50 ug via INTRAVENOUS
  Administered 2021-11-02: 25 ug via INTRAVENOUS
  Filled 2021-10-30 (×4): qty 1
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
## 2021-10-30 NOTE — ED Notes (Addendum)
Critical Troponin 187. Dr. Manus Gunning notified. EKG done.

## 2021-10-30 NOTE — Progress Notes (Signed)
PT Cancellation Note  Patient Details Name: Cheryl Peterson MRN: [id number] DOB: May 08, 1930   Cancelled Treatment:    Reason Eval/Treat Not Completed: Medical issues which prohibited therapy.  Pt had spike in Troponin, getting EKG.  Hold PT for now.   Ivar Drape 10/30/2021, 9:29 AM  Samul Dada, PT PhD Acute Rehab Dept. Number: [facility] [id number] and [facility] [phone]

## 2021-10-31 LAB — COMPREHENSIVE METABOLIC PANEL
ALT: 39 U/L (ref 0–44)
AST: 78 U/L — ABNORMAL HIGH (ref 15–41)
Albumin: 2.8 g/dL — ABNORMAL LOW (ref 3.5–5.0)
Alkaline Phosphatase: 60 U/L (ref 38–126)
Anion gap: 6 (ref 5–15)
BUN: 26 mg/dL — ABNORMAL HIGH (ref 8–23)
CO2: 26 mmol/L (ref 22–32)
Calcium: 8.1 mg/dL — ABNORMAL LOW (ref 8.9–10.3)
Chloride: 96 mmol/L — ABNORMAL LOW (ref 98–111)
Creatinine, Ser: 1.46 mg/dL — ABNORMAL HIGH (ref 0.44–1.00)
GFR, Estimated: 34 mL/min — ABNORMAL LOW (ref >60)
Glucose, Bld: 137 mg/dL — ABNORMAL HIGH (ref 70–99)
Potassium: 4.2 mmol/L (ref 3.5–5.1)
Sodium: 128 mmol/L — ABNORMAL LOW (ref 135–145)
Total Bilirubin: 1.2 mg/dL (ref 0.3–1.2)
Total Protein: 5.2 g/dL — ABNORMAL LOW (ref 6.5–8.1)

## 2021-10-31 MED ORDER — SODIUM CHLORIDE 0.9 % IV SOLN: INTRAVENOUS | Status: DC

## 2021-10-31 MED ORDER — SODIUM CHLORIDE 1 G PO TABS
1.0000 g | ORAL_TABLET | Freq: Two times a day (BID) | ORAL | Status: DC
  Administered 2021-10-31 – 2021-11-03 (×6): 1 g via ORAL
  Filled 2021-10-31 (×8): qty 1

## 2021-10-31 NOTE — Plan of Care (Signed)
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
## 2021-10-31 NOTE — Progress Notes (Signed)
   10/31/21 1155  Clinical Encounter Type  Visited With Family;Patient not available  Visit Type Initial;Spiritual support  Referral From Physician   Chaplain responded to a consult from a physician. Per physician, patient's had a rough morning. Chaplain attempted to visit with the patient, but she was on the phone with one of her sons. Chaplain did meet patient's son, Jeannett Senior, who was in the room with her. Chaplain extended hospitality. Chaplain facilitated life-review. Chaplain introduced spiritual care services. Chaplain will attempt to follow-up this afternoon in hopes of connecting with the patient. Spiritual care services available as needed.   Alda Ponder, Chaplain

## 2021-11-01 LAB — COMPREHENSIVE METABOLIC PANEL
ALT: 39 U/L (ref 0–44)
AST: 57 U/L — ABNORMAL HIGH (ref 15–41)
Albumin: 2.6 g/dL — ABNORMAL LOW (ref 3.5–5.0)
Alkaline Phosphatase: 66 U/L (ref 38–126)
Anion gap: 4 — ABNORMAL LOW (ref 5–15)
BUN: 16 mg/dL (ref 8–23)
CO2: 28 mmol/L (ref 22–32)
Calcium: 8.2 mg/dL — ABNORMAL LOW (ref 8.9–10.3)
Chloride: 100 mmol/L (ref 98–111)
Creatinine, Ser: 1 mg/dL (ref 0.44–1.00)
GFR, Estimated: 54 mL/min — ABNORMAL LOW (ref >60)
Glucose, Bld: 130 mg/dL — ABNORMAL HIGH (ref 70–99)
Potassium: 4.5 mmol/L (ref 3.5–5.1)
Sodium: 132 mmol/L — ABNORMAL LOW (ref 135–145)
Total Bilirubin: 0.8 mg/dL (ref 0.3–1.2)
Total Protein: 5.5 g/dL — ABNORMAL LOW (ref 6.5–8.1)

## 2021-11-01 LAB — CBC
HCT: 32.6 % — ABNORMAL LOW (ref 36.0–46.0)
Hemoglobin: 10.6 g/dL — ABNORMAL LOW (ref 12.0–15.0)
MCH: 30.9 pg (ref 26.0–34.0)
MCHC: 32.5 g/dL (ref 30.0–36.0)
MCV: 95 fL (ref 80.0–100.0)
Platelets: 203 10*3/uL (ref 150–400)
RBC: 3.43 MIL/uL — ABNORMAL LOW (ref 3.87–5.11)
RDW: 13.3 % (ref 11.5–15.5)
WBC: 10.2 10*3/uL (ref 4.0–10.5)
nRBC: 0 % (ref 0.0–0.2)

## 2021-11-01 NOTE — Care Management Important Message (Signed)
Important Message  Patient Details  Name: Cheryl Peterson MRN: [id number] Date of Birth: February 25, 1930   Medicare Important Message Given:  Yes     Sherilyn Banker 11/01/2021, 12:29 PM
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
## 2021-11-01 NOTE — Discharge Instructions (Addendum)
RIB FRACTURES  HOME INSTRUCTIONS   PAIN CONTROL:  Pain is best controlled by a usual combination of three different methods TOGETHER:  Ice/Heat Over the counter pain medication Prescription pain medication You may experience some swelling and bruising in area of broken ribs. Ice packs or heating pads (30-60 minutes up to 6 times a day) will help. Use ice for the first few days to help decrease swelling and bruising, then switch to heat to help relax tight/sore spots and speed recovery. Some people prefer to use ice alone, heat alone, alternating between ice & heat. Experiment to what works for you. Swelling and bruising can take several weeks to resolve.  It is helpful to take an over-the-counter pain medication regularly for the first few weeks. Choose one of the following that works best for you:  Naproxen (Aleve, etc) Two 220mg tabs twice a day Ibuprofen (Advil, etc) Three 200mg tabs four times a day (every meal & bedtime) Acetaminophen (Tylenol, etc) 500-650mg four times a day (every meal & bedtime) A prescription for pain medication (such as oxycodone, hydrocodone, etc) may be given to you upon discharge. Take your pain medication as prescribed.  If you are having problems/concerns with the prescription medicine (does not control pain, nausea, vomiting, rash, itching, etc), please call us [phone] to see if we need to switch you to a different pain medicine that will work better for you and/or control your side effect better. If you need a refill on your pain medication, please contact your pharmacy. They will contact our office to request authorization. Prescriptions will not be filled after 5 pm or on week-ends. Avoid getting constipated. When taking pain medications, it is common to experience some constipation. Increasing fluid intake and taking a fiber supplement (such as Metamucil, Citrucel, FiberCon, MiraLax, etc) 1-2 times a day regularly will usually help prevent this problem  from occurring. A mild laxative (prune juice, Milk of Magnesia, MiraLax, etc) should be taken according to package directions if there are no bowel movements after 48 hours.  Watch out for diarrhea. If you have many loose bowel movements, simplify your diet to bland foods & liquids for a few days. Stop any stool softeners and decrease your fiber supplement. Switching to mild anti-diarrheal medications (Kayopectate, Pepto Bismol) can help. If this worsens or does not improve, please call us. FOLLOW UP  If a follow up appointment is needed one will be scheduled for you. If none is needed with our trauma team, please follow up with your primary care provider within 2-3 weeks from discharge. Please call [facility] at [phone] if you have any questions about follow up.  If you have any orthopedic or other injuries you will need to follow up as outlined in your follow up instructions.   WHEN TO CALL US [phone]:  Poor pain control Reactions / problems with new medications (rash/itching, nausea, etc)  Fever over 101.5 F (38.5 C) Worsening swelling or bruising Worsening pain, productive cough, difficulty breathing or any other concerning symptoms  The clinic staff is available to answer your questions during regular business hours (8:30am-5pm). Please don't hesitate to call and ask to speak to one of our nurses for clinical concerns.  If you have a medical emergency, go to the nearest emergency room or call 911.  A surgeon from [facility] is always on call at the hospitals   [facility]  [address], [address], [place], [place] [address] ?  MAIN: [phone] ? TOLL FREE: [phone] ?  FAX [phone]  [email or web address]      Information on Rib Fractures  A rib fracture is a break or crack in one of the bones of the ribs. The ribs are long, curved bones that wrap around your chest and attach to your spine and your breastbone. The  ribs protect your heart, lungs, and other organs in the chest. A broken or cracked rib is often painful but is not usually serious. Most rib fractures heal on their own over time. However, rib fractures can be more serious if multiple ribs are broken or if broken ribs move out of place and push against other structures or organs. What are the causes? This condition is caused by: Repetitive movements with high force, such as pitching a baseball or having severe coughing spells. A direct blow to the chest, such as a sports injury, a car accident, or a fall. Cancer that has spread to the bones, which can weaken bones and cause them to break. What are the signs or symptoms? Symptoms of this condition include: Pain when you breathe in or cough. Pain when someone presses on the injured area. Feeling short of breath. How is this diagnosed? This condition is diagnosed with a physical exam and medical history. Imaging tests may also be done, such as: Chest X-ray. CT scan. MRI. Lohn scan. Chest ultrasound. How is this treated? Treatment for this condition depends on the severity of the fracture. Most rib fractures usually heal on their own in 1-3 months. Sometimes healing takes longer if there is a cough that does not stop or if there are other activities that make the injury worse (aggravating factors). While you heal, you will be given medicines to control the pain. You will also be taught deep breathing exercises. Severe injuries may require hospitalization or surgery. Follow these instructions at home: Managing pain, stiffness, and swelling If directed, apply ice to the injured area. Put ice in a plastic bag. Place a towel between your skin and the bag. Leave the ice on for 20 minutes, 2-3 times a day. Take over-the-counter and prescription medicines only as told by your health care provider. Activity Avoid a lot of activity and any activities or movements that cause pain. Be careful during  activities and avoid bumping the injured rib. Slowly increase your activity as told by your health care provider. General instructions Do deep breathing exercises as told by your health care provider. This helps prevent pneumonia, which is a common complication of a broken rib. Your health care provider may instruct you to: Take deep breaths several times a day. Try to cough several times a day, holding a pillow against the injured area. Use a device called incentive spirometer to practice deep breathing several times a day. Drink enough fluid to keep your urine pale yellow. Do not wear a rib belt or binder. These restrict breathing, which can lead to pneumonia. Keep all follow-up visits as told by your health care provider. This is important. Contact a health care provider if: You have a fever. Get help right away if: You have difficulty breathing or you are short of breath. You develop a cough that does not stop, or you cough up thick or bloody sputum. You have nausea, vomiting, or pain in your abdomen. Your pain gets worse and medicine does not help. Summary A rib fracture is a break or crack in one of the bones of the ribs. A broken or cracked rib is  often painful but is not usually serious. Most rib fractures heal on their own over time. Treatment for this condition depends on the severity of the fracture. Avoid a lot of activity and any activities or movements that cause pain. This information is not intended to replace advice given to you by your health care provider. Make sure you discuss any questions you have with your health care provider. Document Released: 11/14/2005 Document Revised: 02/13/2017 Document Reviewed: 02/13/2017 Elsevier Interactive Patient Education  2019 [employer].  =================================================  Information on my medicine - ELIQUIS (apixaban)  This medication education was reviewed with me or my healthcare representative as part of  my discharge preparation.     Why was Eliquis prescribed for you? Eliquis was prescribed for you to reduce the risk of a blood clot forming that can cause a stroke if you have a medical condition called atrial fibrillation (a type of irregular heartbeat).  What do You need to know about Eliquis ? Take your Eliquis TWICE DAILY - one tablet in the morning and one tablet in the evening with or without food. If you have difficulty swallowing the tablet whole please discuss with your pharmacist how to take the medication safely.  Take Eliquis exactly as prescribed by your doctor and DO NOT stop taking Eliquis without talking to the doctor who prescribed the medication.  Stopping may increase your risk of developing a stroke.  Refill your prescription before you run out.  After discharge, you should have regular check-up appointments with your healthcare provider that is prescribing your Eliquis.  In the future your dose may need to be changed if your kidney function or weight changes by a significant amount or as you get older.  What do you do if you miss a dose? If you miss a dose, take it as soon as you remember on the same day and resume taking twice daily.  Do not take more than one dose of ELIQUIS at the same time to make up a missed dose.  Important Safety Information A possible side effect of Eliquis is bleeding. You should call your healthcare provider right away if you experience any of the following: Bleeding from an injury or your nose that does not stop. Unusual colored urine (red or dark brown) or unusual colored stools (red or black). Unusual bruising for unknown reasons. A serious fall or if you hit your head (even if there is no bleeding).  Some medicines may interact with Eliquis and might increase your risk of bleeding or clotting while on Eliquis. To help avoid this, consult your healthcare provider or pharmacist prior to using any new prescription or non-prescription  medications, including herbals, vitamins, non-steroidal anti-inflammatory drugs (NSAIDs) and supplements.  This website has more information on Eliquis (apixaban): http://www.eliquis.com/eliquis/home  ====================================  Atrial Flutter  Atrial flutter is a type of abnormal heart rhythm (arrhythmia). The heart has an electrical system that tells it how to beat. In atrial flutter, the signals move rapidly in the top chambers of the heart (the atria). This makes your heart beat very fast. Atrial flutter can come and go, or it can be permanent. The goal of treatment is to prevent blood clots from forming, control your heart rate, or restore your heartbeat to a normal rhythm. If this condition is not treated, it can cause serious problems, such as a weakened heart muscle (cardiomyopathy) or a stroke.  What are the causes? This condition is often caused by conditions that damage the heart's electrical system.  These include: Heart conditions and heart surgery. These include heart attacks and open-heart surgery. Lung problems, such as COPD or a blood clot in the lung (pulmonary embolism, or PE). Poorly controlled high blood pressure (hypertension). Overactive thyroid (hyperthyroidism). Diabetes. In some cases, the cause of this condition is not known.  What increases the risk? You are more likely to develop this condition if: You are an elderly adult. You are a man. You are overweight (obese). You have obstructive sleep apnea. You have a family history of atrial flutter. You have diabetes. You drink a lot of alcohol, especially binge drinking. You use drugs, including cannabis. You smoke.  What are the signs or symptoms? Symptoms of this condition include: A feeling that your heart is pounding or racing (palpitations). Shortness of breath. Chest pain. Feeling dizzy or light-headed. Fainting. Low blood pressure (hypotension). Fatigue. Tiring easily during exercise  or activity. In some cases, there are no symptoms.  How is this diagnosed? This condition may be diagnosed with: An electrocardiogram (ECG) to check electrical signals of the heart. An ambulatory cardiac monitor to record your heart's activity for a few days. An echocardiogram to create pictures of your heart. A transesophageal echocardiogram (TEE) to create even better pictures of your heart. A stress test to check your blood supply while you exercise. Imaging tests, such as a CT scan or chest X-ray. Blood tests.  How is this treated? Treatment depends on underlying conditions and how you feel when you experience atrial flutter. This condition may be treated with: Medicines to prevent blood clots or to treat heart rate or heart rhythm problems. Electrical cardioversion to reset the heart's rhythm. Ablation to remove the heart tissue that sends abnormal signals. Left atrial appendage closure to seal the area where blood clots can form. In some cases, underlying conditions will be treated.  Follow these instructions at home:  Medicines Take over-the-counter and prescription medicines only as told by your health care provider. Do not take any new medicines without talking to your health care provider. If you are taking blood thinners: Talk with your health care provider before you take any medicines that contain aspirin or NSAIDs, such as ibuprofen. These medicines increase your risk for dangerous bleeding. Take your medicine exactly as told, at the same time every day. Avoid activities that could cause injury or bruising, and follow instructions about how to prevent falls. Wear a medical alert bracelet or carry a card that lists what medicines you take.  Lifestyle Eat heart-healthy foods. Talk with a dietitian to make an eating plan that is right for you. Do not use any products that contain nicotine or tobacco, such as cigarettes, e-cigarettes, and chewing tobacco. If you need help  quitting, ask your health care provider. Do not drink alcohol. Do not use drugs, including cannabis. Lose weight if you are overweight or obese. Exercise regularly as instructed by your health care provider.  General instructions Do not use diet pills unless your health care provider approves. Diet pills may make heart problems worse. If you have obstructive sleep apnea, manage your condition as told by your health care provider. Keep all follow-up visits as told by your health care provider. This is important.  Contact a health care provider if you: Notice a change in the rate, rhythm, or strength of your heartbeat. Are taking a blood thinner and you notice more bruising. Have a sudden change in weight. Tire more easily when you exercise or do heavy work.  Get help right  away if you have: Pain or pressure in your chest. Shortness of breath. Fainting. Increasing sweating with no known cause. Side effects of blood thinners, such as blood in your vomit, stool, or urine, or bleeding that cannot stop. Any symptoms of a stroke. "BE FAST" is an easy way to remember the main warning signs of a stroke: B - Balance. Signs are dizziness, sudden trouble walking, or loss of balance. E - Eyes. Signs are trouble seeing or a sudden change in vision. F - Face. Signs are sudden weakness or numbness of the face, or the face or eyelid drooping on one side. A - Arms. Signs are weakness or numbness in an arm. This happens suddenly and usually on one side of the body. S - Speech. Signs are sudden trouble speaking, slurred speech, or trouble understanding what people say. T - Time. Time to call emergency services. Write down what time symptoms started. Other signs of a stroke, such as: A sudden, severe headache with no known cause. Nausea or vomiting. Seizure. These symptoms may represent a serious problem that is an emergency. Do not wait to see if the symptoms will go away. Get medical help right away.  Call your local emergency services (911 in the [place] Do not drive yourself to the hospital.  Summary Atrial flutter is an abnormal heart rhythm that can give you symptoms of palpitations, shortness of breath, or fatigue. Atrial flutter is often treated with medicines to keep your heart in a normal rhythm and to prevent a stroke. Get help right away if you cannot catch your breath, or have chest pain or pressure. Get help right away if you have signs or symptoms of a stroke. This information is not intended to replace advice given to you by your health care provider. Make sure you discuss any questions you have with your health care provider. Document Revised: 05/08/2019 Document Reviewed: 05/08/2019 Elsevier Patient Education  [employer].
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
## 2021-11-01 NOTE — Plan of Care (Signed)

## 2021-11-02 ENCOUNTER — Inpatient Hospital Stay (HOSPITAL_COMMUNITY): Payer: Medicare PPO

## 2021-11-02 LAB — BASIC METABOLIC PANEL
Anion gap: 7 (ref 5–15)
BUN: 14 mg/dL (ref 8–23)
CO2: 28 mmol/L (ref 22–32)
Calcium: 8.6 mg/dL — ABNORMAL LOW (ref 8.9–10.3)
Chloride: 98 mmol/L (ref 98–111)
Creatinine, Ser: 0.91 mg/dL (ref 0.44–1.00)
GFR, Estimated: >60 mL/min (ref >60)
Glucose, Bld: 182 mg/dL — ABNORMAL HIGH (ref 70–99)
Potassium: 5.2 mmol/L — ABNORMAL HIGH (ref 3.5–5.1)
Sodium: 133 mmol/L — ABNORMAL LOW (ref 135–145)

## 2021-11-02 LAB — CBC
HCT: 30.8 % — ABNORMAL LOW (ref 36.0–46.0)
Hemoglobin: 10.3 g/dL — ABNORMAL LOW (ref 12.0–15.0)
MCH: 31.4 pg (ref 26.0–34.0)
MCHC: 33.4 g/dL (ref 30.0–36.0)
MCV: 93.9 fL (ref 80.0–100.0)
Platelets: 214 10*3/uL (ref 150–400)
RBC: 3.28 MIL/uL — ABNORMAL LOW (ref 3.87–5.11)
RDW: 13.2 % (ref 11.5–15.5)
WBC: 9.5 10*3/uL (ref 4.0–10.5)
nRBC: 0 % (ref 0.0–0.2)

## 2021-11-02 LAB — BRAIN NATRIURETIC PEPTIDE: B Natriuretic Peptide: 577.2 pg/mL — ABNORMAL HIGH (ref 0.0–100.0)

## 2021-11-02 LAB — TROPONIN I (HIGH SENSITIVITY): Troponin I (High Sensitivity): 41 ng/L — ABNORMAL HIGH (ref <18)

## 2021-11-02 MED ORDER — IPRATROPIUM-ALBUTEROL 0.5-2.5 (3) MG/3ML IN SOLN
3.0000 mL | Freq: Four times a day (QID) | RESPIRATORY_TRACT | Status: DC
  Administered 2021-11-02: 3 mL via RESPIRATORY_TRACT
  Filled 2021-11-02 (×3): qty 3

## 2021-11-02 MED ORDER — HALOPERIDOL LACTATE 5 MG/ML IJ SOLN: 2.0000 mg | Freq: Once | INTRAMUSCULAR | Status: AC

## 2021-11-02 MED ORDER — METOPROLOL TARTRATE 5 MG/5ML IV SOLN
5.0000 mg | Freq: Four times a day (QID) | INTRAVENOUS | Status: DC
  Administered 2021-11-02 – 2021-11-03 (×4): 5 mg via INTRAVENOUS
  Filled 2021-11-02 (×4): qty 5

## 2021-11-02 MED ORDER — METOPROLOL TARTRATE 5 MG/5ML IV SOLN
5.0000 mg | Freq: Once | INTRAVENOUS | Status: AC
  Administered 2021-11-02: 5 mg via INTRAVENOUS
  Filled 2021-11-02: qty 5

## 2021-11-02 MED ORDER — METOPROLOL TARTRATE 5 MG/5ML IV SOLN
INTRAVENOUS | Status: AC
  Filled 2021-11-02: qty 5

## 2021-11-02 MED ORDER — METOPROLOL TARTRATE 5 MG/5ML IV SOLN
5.0000 mg | Freq: Once | INTRAVENOUS | Status: AC
  Administered 2021-11-02: 5 mg via INTRAVENOUS

## 2021-11-02 MED ORDER — METOPROLOL TARTRATE 5 MG/5ML IV SOLN
2.5000 mg | Freq: Once | INTRAVENOUS | Status: AC
  Administered 2021-11-02: 2.5 mg via INTRAVENOUS
  Filled 2021-11-02: qty 5

## 2021-11-02 MED ORDER — MELATONIN 3 MG PO TABS
3.0000 mg | ORAL_TABLET | Freq: Every day | ORAL | Status: DC
  Administered 2021-11-03 – 2021-11-09 (×7): 3 mg via ORAL
  Filled 2021-11-02 (×8): qty 1

## 2021-11-02 MED ORDER — HALOPERIDOL LACTATE 5 MG/ML IJ SOLN
INTRAMUSCULAR | Status: AC
  Administered 2021-11-02: 2 mg
  Filled 2021-11-02: qty 1

## 2021-11-02 MED ORDER — IBUPROFEN 200 MG PO TABS
200.0000 mg | ORAL_TABLET | Freq: Three times a day (TID) | ORAL | Status: DC | PRN
  Filled 2021-11-02 (×2): qty 1

## 2021-11-02 NOTE — Progress Notes (Addendum)
Trauma Response Nurse Note-  Reason for Call / Reason for Trauma activation:   - Rapid Response called and informed TRN of patients heart rate in the 140s  Initial Focused Assessment (If applicable, or please see trauma documentation):  - Pt resting in bed.   Plan of Care as of this note:  - Dr. Janee Morn notified and order obtained for 5mg  of IV lopressor  Event Summary:   - Rapid Response RN called this RN with concern of a rapid heart rate. Pt alert and resting in the bed. Son at bedside. NAD noted. Dr. called and was notified. Verbal order received for 5mg  of lopressor and primary RN informed of this. Rapid Response RN also came back to bedside to assist.   The Following (if applicable):    -MD notified: Dr. Janee Morn
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
## 2021-11-02 NOTE — Progress Notes (Signed)
Pt's HR went up to 140's-150's, called Dr. Andrey Campanile, gave an order to give Lopressor IV 5mg  X1.

## 2021-11-02 NOTE — Progress Notes (Addendum)
Cardiology is consulted for evaluation and management of tachycardia.   Upon entering to room, Ms. Noori was already upset with care she was getting from primary service. Son and nurse were present at bedside.   Patient said " I been here for past two days and physician is not discussed anything with me or my son". She wanted to have her medications stopped. She was getting IV fluids on visual observation. She was very angry and upset.   I have introduce myself multiple times and stated that primary service has requested cardiology consult for tachycardia. Patient said " I have anxiety thus my heart rate is high". She declined cardiology evaluation and told to left the room.   I have advise bedside nurse to call attending and update about refusing medications.   Will sign off. Call cardiology when patient is stable.
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
## 2021-11-02 NOTE — Progress Notes (Addendum)
TRN paged cardiology due to heart rate sustaining 140s. Cardiology paged as per Dr. Fredricka Bonine.   Prior to cardiology being paged, pt resting in bed.  Primary RN states that cardiology was previously paged without a response.

## 2021-11-03 ENCOUNTER — Inpatient Hospital Stay (HOSPITAL_COMMUNITY): Payer: Medicare PPO

## 2021-11-03 ENCOUNTER — Other Ambulatory Visit (HOSPITAL_COMMUNITY): Payer: Medicare PPO

## 2021-11-03 DIAGNOSIS — R9431 Abnormal electrocardiogram [ECG] [EKG]: Secondary | ICD-10-CM | POA: Diagnosis not present

## 2021-11-03 DIAGNOSIS — S2243XA Multiple fractures of ribs, bilateral, initial encounter for closed fracture: Secondary | ICD-10-CM

## 2021-11-03 DIAGNOSIS — I484 Atypical atrial flutter: Secondary | ICD-10-CM

## 2021-11-03 LAB — BASIC METABOLIC PANEL
Anion gap: 8 (ref 5–15)
BUN: 21 mg/dL (ref 8–23)
CO2: 26 mmol/L (ref 22–32)
Calcium: 8.4 mg/dL — ABNORMAL LOW (ref 8.9–10.3)
Chloride: 96 mmol/L — ABNORMAL LOW (ref 98–111)
Creatinine, Ser: 0.97 mg/dL (ref 0.44–1.00)
GFR, Estimated: 56 mL/min — ABNORMAL LOW (ref >60)
Glucose, Bld: 149 mg/dL — ABNORMAL HIGH (ref 70–99)
Potassium: 4.5 mmol/L (ref 3.5–5.1)
Sodium: 130 mmol/L — ABNORMAL LOW (ref 135–145)

## 2021-11-03 LAB — CBC
HCT: 33.4 % — ABNORMAL LOW (ref 36.0–46.0)
Hemoglobin: 10.8 g/dL — ABNORMAL LOW (ref 12.0–15.0)
MCH: 30.6 pg (ref 26.0–34.0)
MCHC: 32.3 g/dL (ref 30.0–36.0)
MCV: 94.6 fL (ref 80.0–100.0)
Platelets: 245 10*3/uL (ref 150–400)
RBC: 3.53 MIL/uL — ABNORMAL LOW (ref 3.87–5.11)
RDW: 13.4 % (ref 11.5–15.5)
WBC: 9.1 10*3/uL (ref 4.0–10.5)
nRBC: 0 % (ref 0.0–0.2)

## 2021-11-03 MED ORDER — IPRATROPIUM-ALBUTEROL 0.5-2.5 (3) MG/3ML IN SOLN
3.0000 mL | Freq: Three times a day (TID) | RESPIRATORY_TRACT | Status: DC
  Administered 2021-11-03 (×2): 3 mL via RESPIRATORY_TRACT
  Filled 2021-11-03 (×3): qty 3

## 2021-11-03 MED ORDER — IPRATROPIUM BROMIDE 0.02 % IN SOLN
0.5000 mg | Freq: Three times a day (TID) | RESPIRATORY_TRACT | Status: DC
Start: 2021-11-03 — End: 2021-11-08
  Administered 2021-11-03 – 2021-11-08 (×14): 0.5 mg via RESPIRATORY_TRACT
  Filled 2021-11-03 (×14): qty 2.5

## 2021-11-03 MED ORDER — HALOPERIDOL LACTATE 5 MG/ML IJ SOLN: 2.0000 mg | Freq: Three times a day (TID) | INTRAMUSCULAR | Status: DC | PRN

## 2021-11-03 MED ORDER — DILTIAZEM HCL 60 MG PO TABS
30.0000 mg | ORAL_TABLET | Freq: Four times a day (QID) | ORAL | Status: AC
  Administered 2021-11-03 – 2021-11-09 (×22): 30 mg via ORAL
  Filled 2021-11-03 (×22): qty 1

## 2021-11-03 MED ORDER — DILTIAZEM LOAD VIA INFUSION
10.0000 mg | Freq: Once | INTRAVENOUS | Status: DC
  Filled 2021-11-03: qty 10

## 2021-11-03 MED ORDER — LEVALBUTEROL HCL 0.63 MG/3ML IN NEBU
0.6300 mg | INHALATION_SOLUTION | Freq: Three times a day (TID) | RESPIRATORY_TRACT | Status: DC
  Administered 2021-11-03 – 2021-11-08 (×10): 0.63 mg via RESPIRATORY_TRACT
  Filled 2021-11-03 (×18): qty 3

## 2021-11-03 MED ORDER — DILTIAZEM HCL-DEXTROSE 125-5 MG/125ML-% IV SOLN (PREMIX)
5.0000 mg/h | INTRAVENOUS | Status: DC
Start: 2021-11-03 — End: 2021-11-03
  Filled 2021-11-03: qty 125

## 2021-11-03 MED ORDER — FUROSEMIDE 10 MG/ML IJ SOLN
40.0000 mg | Freq: Once | INTRAMUSCULAR | Status: AC
  Administered 2021-11-03: 40 mg via INTRAVENOUS
  Filled 2021-11-03: qty 4

## 2021-11-03 MED ORDER — SODIUM CHLORIDE 1 G PO TABS
2.0000 g | ORAL_TABLET | Freq: Three times a day (TID) | ORAL | Status: DC
  Administered 2021-11-03 – 2021-11-10 (×22): 2 g via ORAL
  Filled 2021-11-03 (×24): qty 2

## 2021-11-03 MED ORDER — GUAIFENESIN ER 600 MG PO TB12
600.0000 mg | ORAL_TABLET | Freq: Two times a day (BID) | ORAL | Status: DC
  Administered 2021-11-03 – 2021-11-10 (×15): 600 mg via ORAL
  Filled 2021-11-03 (×15): qty 1
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
## 2021-11-03 NOTE — Progress Notes (Signed)
Pt initially placed on a cardiology drip but order discontinued as cardiologist does not want her transferred to another unit, po cardizem ordered for heart rate control
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
## 2021-11-03 NOTE — Progress Notes (Signed)
TRN went on rounds and talked with primary RN. Pt noted to have a heart rate still in the 140s. Dr. Cliffton Asters notified. Primary RN also pointed out that pt had a transfer out order, but that it was canceled. Dr. Cliffton Asters informed of this as well. Dr. Cliffton Asters stated he placed the order but did not cancel the order, but to have the primary RN page cardiology "if they want her transferred [so] they can handle that. They can handle drip orders etc."  Primary RN is paging cardiology.
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
## 2021-11-03 NOTE — Plan of Care (Signed)
  Problem: Nutrition: Goal: Adequate nutrition will be maintained Outcome: Progressing   Problem: Pain Managment: Goal: General experience of comfort will improve Outcome: Progressing   Problem: Safety: Goal: Ability to remain free from injury will improve Outcome: Progressing   

## 2021-11-03 NOTE — Progress Notes (Signed)
Called Trauma Rn to verify if Cardiologist called back, said no and he will let Dr Fredricka Bonine know.

## 2021-11-03 NOTE — Progress Notes (Signed)
Cardiology paged as advised , waiting for response
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
## 2021-11-03 NOTE — Progress Notes (Signed)
On call MD text paged regarding pt's heart rate that's been reported by the primary nurse to be sustaining around the 130s and 140s. Advised to page cardiology

## 2021-11-03 NOTE — Progress Notes (Signed)
OT Cancellation Note  Patient Details Name: Cheryl Peterson MRN: [id number] DOB: [birth year]   Cancelled Treatment:    Reason Eval/Treat Not Completed: Medical issues which prohibited therapy. RN requesting hold OT session again today as pt resting HR 150 bpm. Will follow as able.  Hillard Danker, OT/L  Acute Rehab [phone]  Lenice Llamas 11/03/2021, 5:30 PM

## 2021-11-03 NOTE — Progress Notes (Signed)
Cardiology called primary RN back from page and stated that as long as patients Blood pressure is stable, pt does not need to be transferred to a higher level of care. This TRN was present for the conversation between Dr. Lysle Morales (cardiology) and the primary RN, Jewel.

## 2021-11-04 ENCOUNTER — Inpatient Hospital Stay (HOSPITAL_COMMUNITY): Payer: Medicare PPO

## 2021-11-04 DIAGNOSIS — S2243XA Multiple fractures of ribs, bilateral, initial encounter for closed fracture: Secondary | ICD-10-CM | POA: Diagnosis not present

## 2021-11-04 DIAGNOSIS — I5031 Acute diastolic (congestive) heart failure: Secondary | ICD-10-CM

## 2021-11-04 DIAGNOSIS — I484 Atypical atrial flutter: Secondary | ICD-10-CM | POA: Diagnosis not present

## 2021-11-04 LAB — BASIC METABOLIC PANEL
Anion gap: 9 (ref 5–15)
BUN: 18 mg/dL (ref 8–23)
CO2: 27 mmol/L (ref 22–32)
Calcium: 8.5 mg/dL — ABNORMAL LOW (ref 8.9–10.3)
Chloride: 91 mmol/L — ABNORMAL LOW (ref 98–111)
Creatinine, Ser: 0.97 mg/dL (ref 0.44–1.00)
GFR, Estimated: 56 mL/min — ABNORMAL LOW (ref >60)
Glucose, Bld: 163 mg/dL — ABNORMAL HIGH (ref 70–99)
Potassium: 4.3 mmol/L (ref 3.5–5.1)
Sodium: 127 mmol/L — ABNORMAL LOW (ref 135–145)

## 2021-11-04 LAB — ECHOCARDIOGRAM LIMITED
Height: 64 in
S' Lateral: 2.2 cm
Weight: 2560.86 oz

## 2021-11-04 MED ORDER — POLYETHYLENE GLYCOL 3350 17 G PO PACK
17.0000 g | PACK | Freq: Every day | ORAL | Status: DC
  Administered 2021-11-04: 17 g via ORAL
  Filled 2021-11-04: qty 1

## 2021-11-04 MED ORDER — SENNOSIDES-DOCUSATE SODIUM 8.6-50 MG PO TABS
1.0000 | ORAL_TABLET | Freq: Every day | ORAL | Status: DC
  Administered 2021-11-04 – 2021-11-08 (×5): 1 via ORAL
  Filled 2021-11-04 (×5): qty 1

## 2021-11-04 MED ORDER — MAGNESIUM HYDROXIDE 400 MG/5ML PO SUSP
30.0000 mL | Freq: Once | ORAL | Status: AC
Start: 2021-11-04 — End: 2021-11-04
  Administered 2021-11-04: 30 mL via ORAL
  Filled 2021-11-04: qty 30

## 2021-11-04 MED ORDER — FUROSEMIDE 10 MG/ML IJ SOLN
40.0000 mg | Freq: Once | INTRAMUSCULAR | Status: AC
  Administered 2021-11-04: 40 mg via INTRAVENOUS
  Filled 2021-11-04: qty 4

## 2021-11-04 MED ORDER — OXYCODONE HCL 5 MG PO TABS
2.5000 mg | ORAL_TABLET | ORAL | Status: DC | PRN
  Administered 2021-11-04: 2.5 mg via ORAL
  Filled 2021-11-04: qty 1

## 2021-11-04 MED ORDER — METOPROLOL TARTRATE 12.5 MG HALF TABLET
12.5000 mg | ORAL_TABLET | Freq: Two times a day (BID) | ORAL | Status: DC
  Administered 2021-11-04 (×2): 12.5 mg via ORAL
  Filled 2021-11-04 (×2): qty 1

## 2021-11-04 MED ORDER — TRAMADOL HCL 50 MG PO TABS: 50.0000 mg | ORAL_TABLET | Freq: Four times a day (QID) | ORAL | Status: DC | PRN

## 2021-11-04 MED ORDER — LORAZEPAM 2 MG/ML IJ SOLN
1.0000 mg | Freq: Four times a day (QID) | INTRAMUSCULAR | Status: DC | PRN
  Administered 2021-11-05: 1 mg via INTRAVENOUS
  Filled 2021-11-04: qty 1

## 2021-11-04 NOTE — Progress Notes (Signed)
PT Cancellation Note  Patient Details Name: Cheryl Peterson MRN: [id number] DOB: 02-11-1930   Cancelled Treatment:    Reason Eval/Treat Not Completed: Patient not medically ready.  Hold today, HR sustained in the 150's. Will see 12/9 as appropriate. 11/04/2021  Jacinto Halim., PT Acute Rehabilitation Services [phone]  (pager) [phone]  (office)   [name] 11/04/2021, 6:39 PM
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
## 2021-11-04 NOTE — Progress Notes (Signed)
*  PRELIMINARY RESULTS* Echocardiogram A Limited 2D Echocardiogram has been performed.  Carolyne Fiscal 11/04/2021, 10:56 AM

## 2021-11-04 NOTE — Progress Notes (Signed)
OT Cancellation Note  Patient Details Name: Cheryl Peterson MRN: [id number] DOB: [birth year]   Cancelled Treatment:    Reason Eval/Treat Not Completed: Medical issues which prohibited therapy (RN asked OT to hold again today, pt HR in 150s and MD is aware per RN)  Galen Manila 11/04/2021, 2:42 PM
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
## 2021-11-04 NOTE — Progress Notes (Signed)
  Progress Note   Date: 11/04/2021  [name]        MRN#: [id number]  Clarification of diagnosis:  CKD Stage IIIa

## 2021-11-04 NOTE — Progress Notes (Addendum)
Mobility Specialist Progress Note   11/04/21 1805  Mobility  Activity Contraindicated/medical hold   Hold off per advisement of RN. Pt HR still running >140's.   Frederico Hamman Mobility Specialist Phone Number [phone]
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
## 2021-11-05 ENCOUNTER — Other Ambulatory Visit (HOSPITAL_COMMUNITY): Payer: Self-pay

## 2021-11-05 DIAGNOSIS — I484 Atypical atrial flutter: Secondary | ICD-10-CM | POA: Diagnosis not present

## 2021-11-05 LAB — BASIC METABOLIC PANEL
Anion gap: 9 (ref 5–15)
BUN: 20 mg/dL (ref 8–23)
CO2: 29 mmol/L (ref 22–32)
Calcium: 8.3 mg/dL — ABNORMAL LOW (ref 8.9–10.3)
Chloride: 88 mmol/L — ABNORMAL LOW (ref 98–111)
Creatinine, Ser: 0.96 mg/dL (ref 0.44–1.00)
GFR, Estimated: 57 mL/min — ABNORMAL LOW (ref >60)
Glucose, Bld: 145 mg/dL — ABNORMAL HIGH (ref 70–99)
Potassium: 4.3 mmol/L (ref 3.5–5.1)
Sodium: 126 mmol/L — ABNORMAL LOW (ref 135–145)

## 2021-11-05 LAB — CREATININE, SERUM
Creatinine, Ser: 1.06 mg/dL — ABNORMAL HIGH (ref 0.44–1.00)
GFR, Estimated: 50 mL/min — ABNORMAL LOW (ref >60)

## 2021-11-05 MED ORDER — METOPROLOL TARTRATE 25 MG PO TABS
25.0000 mg | ORAL_TABLET | Freq: Two times a day (BID) | ORAL | Status: DC
  Administered 2021-11-05 – 2021-11-10 (×10): 25 mg via ORAL
  Filled 2021-11-05 (×11): qty 1

## 2021-11-05 MED ORDER — MAGNESIUM HYDROXIDE 400 MG/5ML PO SUSP: 5.0000 mL | Freq: Every day | ORAL | Status: DC | PRN

## 2021-11-05 MED ORDER — POLYETHYLENE GLYCOL 3350 17 G PO PACK
17.0000 g | PACK | Freq: Two times a day (BID) | ORAL | Status: DC
  Administered 2021-11-05 – 2021-11-10 (×9): 17 g via ORAL
  Filled 2021-11-05 (×9): qty 1

## 2021-11-05 MED ORDER — LORAZEPAM 1 MG PO TABS
1.0000 mg | ORAL_TABLET | Freq: Four times a day (QID) | ORAL | Status: DC | PRN
  Administered 2021-11-08: 1 mg via ORAL
  Filled 2021-11-05: qty 1

## 2021-11-05 MED ORDER — APIXABAN 5 MG PO TABS
5.0000 mg | ORAL_TABLET | Freq: Two times a day (BID) | ORAL | Status: DC
  Administered 2021-11-05 – 2021-11-10 (×11): 5 mg via ORAL
  Filled 2021-11-05 (×11): qty 1

## 2021-11-05 MED ORDER — BISACODYL 5 MG PO TBEC
5.0000 mg | DELAYED_RELEASE_TABLET | Freq: Once | ORAL | Status: AC
  Administered 2021-11-05: 5 mg via ORAL
  Filled 2021-11-05: qty 1

## 2021-11-05 NOTE — Progress Notes (Signed)
OT Cancellation Note  Patient Details Name: Cheryl Peterson MRN: [id number] DOB: October 21, 1930   Cancelled Treatment:    Reason Eval/Treat Not Completed: Other (comment) (Just finished with PT. Will return as schedule allows.)  Emmaline Wahba M Crissy Mccreadie [place] MSOT, OTR/L Acute Rehab Pager: [phone] Office: [phone] 11/05/2021, 1:02 PM
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
## 2021-11-05 NOTE — Progress Notes (Signed)
TEE-guided DCCV scheduled for Tues 11/09/21 with Dr. Cristal Deer. Procedure pending ability to anticoagulate. Continue to follow over the weekend. Orders and consent to follow on Monday if still indicated.
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
## 2021-11-05 NOTE — TOC Progression Note (Addendum)
Transition of Care [facility]) - Progression Note    Patient Details  Name: Cheryl Peterson MRN: [id number] Date of Birth: November 19, 1930  Transition of Care [facility]) CM/SW Contact  Astrid Drafts Berna Spare, RN Phone Number: 11/05/2021, 1:51 PM  Clinical Narrative:    Patient not medically stable for discharge to SNF currently, per MD.  Patient's son has communicated with admissions director at [facility] [place], and they are evaluating for admission.  Spoke with French Ana in admissions at facility; she states they will follow-up early next week for patient's progress with therapies. Updated patient and son, at bedside.  Will continue to provide updates as available.  Skilled Nursing Rehab Facilities-   [email or web address]     Ratings out of 5 possible    Name Address  Phone # Quality Care Staffing Health Inspection Overall  [facility] [address], [place] [phone] 5 5 2 4   [facility]  [address], [place] [phone] 4 2 5 5   [facility] [address] [place], [address] [place] 4 1 1 1   [facility] [address] 2 2 4 4   [facility] [address] [phone] 2 1 1 1   [facility] [phone] [address] [phone] 3 1 4 3   [facility] [address], [address] [place] 5 2 2 3   [facility] [address] [facility] [place] 4 1 2 1   [facility] [address] [facility] 5 1 2 2   [facility] [phone], [phone] 4 1 1 1   [facility] [address], [facility] [phone] 4 1 2 1   [facility]. [address] 4 1 1 1        Expected Discharge Plan: Skilled Nursing Facility Barriers to Discharge: Continued Medical Work up  Expected Discharge Plan and Services Expected Discharge Plan: Skilled Nursing Facility    Discharge Planning Services: CM Consult Post Acute Care Choice: Skilled Nursing Facility Living arrangements for the past 2 months: Independent Living Facility [facility] [place])                                       Social Determinants of Health (SDOH) Interventions    Readmission Risk Interventions No flowsheet data found.  , RN, BSN  Trauma/Neuro ICU Case Manager [phone]

## 2021-11-05 NOTE — TOC Benefit Eligibility Note (Signed)
Patient Advocate Encounter ° °Insurance verification completed.   ° °The patient is currently admitted and upon discharge could be taking Eliquis 5 mg. ° °The current 30 day co-pay is, $40.00.  ° °The patient is insured through [employer] Medicare Part D  ° ° ° °Avett Reineck, CPhT °Pharmacy Patient Advocate Specialist °[facility] Patient Advocate Team °Direct Number: [phone]  Fax: [phone] ° ° ° ° ° °  °
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
## 2021-11-06 LAB — BASIC METABOLIC PANEL
Anion gap: 8 (ref 5–15)
BUN: 19 mg/dL (ref 8–23)
CO2: 27 mmol/L (ref 22–32)
Calcium: 8.5 mg/dL — ABNORMAL LOW (ref 8.9–10.3)
Chloride: 88 mmol/L — ABNORMAL LOW (ref 98–111)
Creatinine, Ser: 0.87 mg/dL (ref 0.44–1.00)
GFR, Estimated: >60 mL/min (ref >60)
Glucose, Bld: 143 mg/dL — ABNORMAL HIGH (ref 70–99)
Potassium: 4.5 mmol/L (ref 3.5–5.1)
Sodium: 123 mmol/L — ABNORMAL LOW (ref 135–145)

## 2021-11-06 LAB — CBC
HCT: 31.5 % — ABNORMAL LOW (ref 36.0–46.0)
Hemoglobin: 10.5 g/dL — ABNORMAL LOW (ref 12.0–15.0)
MCH: 31.1 pg (ref 26.0–34.0)
MCHC: 33.3 g/dL (ref 30.0–36.0)
MCV: 93.2 fL (ref 80.0–100.0)
Platelets: 343 10*3/uL (ref 150–400)
RBC: 3.38 MIL/uL — ABNORMAL LOW (ref 3.87–5.11)
RDW: 13.3 % (ref 11.5–15.5)
WBC: 10.4 10*3/uL (ref 4.0–10.5)
nRBC: 0 % (ref 0.0–0.2)

## 2021-11-06 MED ORDER — MAGNESIUM HYDROXIDE 400 MG/5ML PO SUSP
200.0000 mL | Freq: Once | ORAL | Status: AC
  Administered 2021-11-06: 200 mL via RECTAL
  Filled 2021-11-06: qty 60

## 2021-11-06 MED ORDER — FUROSEMIDE 10 MG/ML IJ SOLN
20.0000 mg | Freq: Once | INTRAMUSCULAR | Status: AC
  Administered 2021-11-06: 20 mg via INTRAVENOUS
  Filled 2021-11-06: qty 2
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
## 2021-11-07 LAB — BASIC METABOLIC PANEL
Anion gap: 9 (ref 5–15)
BUN: 17 mg/dL (ref 8–23)
CO2: 28 mmol/L (ref 22–32)
Calcium: 8.4 mg/dL — ABNORMAL LOW (ref 8.9–10.3)
Chloride: 88 mmol/L — ABNORMAL LOW (ref 98–111)
Creatinine, Ser: 0.89 mg/dL (ref 0.44–1.00)
GFR, Estimated: >60 mL/min (ref >60)
Glucose, Bld: 119 mg/dL — ABNORMAL HIGH (ref 70–99)
Potassium: 4.1 mmol/L (ref 3.5–5.1)
Sodium: 125 mmol/L — ABNORMAL LOW (ref 135–145)
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
## 2021-11-08 LAB — BASIC METABOLIC PANEL
Anion gap: 9 (ref 5–15)
BUN: 11 mg/dL (ref 8–23)
CO2: 27 mmol/L (ref 22–32)
Calcium: 8.3 mg/dL — ABNORMAL LOW (ref 8.9–10.3)
Chloride: 89 mmol/L — ABNORMAL LOW (ref 98–111)
Creatinine, Ser: 0.77 mg/dL (ref 0.44–1.00)
GFR, Estimated: >60 mL/min (ref >60)
Glucose, Bld: 136 mg/dL — ABNORMAL HIGH (ref 70–99)
Potassium: 4.2 mmol/L (ref 3.5–5.1)
Sodium: 125 mmol/L — ABNORMAL LOW (ref 135–145)

## 2021-11-08 MED ORDER — IPRATROPIUM BROMIDE 0.02 % IN SOLN: 0.5000 mg | Freq: Four times a day (QID) | RESPIRATORY_TRACT | Status: DC | PRN

## 2021-11-08 MED ORDER — MAGNESIUM HYDROXIDE 400 MG/5ML PO SUSP
5.0000 mL | Freq: Once | ORAL | Status: AC
  Administered 2021-11-08: 5 mL via ORAL
  Filled 2021-11-08: qty 30

## 2021-11-08 MED ORDER — LEVALBUTEROL HCL 0.63 MG/3ML IN NEBU
0.6300 mg | INHALATION_SOLUTION | Freq: Four times a day (QID) | RESPIRATORY_TRACT | Status: DC | PRN
  Filled 2021-11-08: qty 3

## 2021-11-08 MED ORDER — BISACODYL 10 MG RE SUPP
10.0000 mg | Freq: Once | RECTAL | Status: AC
  Administered 2021-11-08: 10 mg via RECTAL
  Filled 2021-11-08: qty 1

## 2021-11-08 MED ORDER — DILTIAZEM HCL ER COATED BEADS 120 MG PO CP24
120.0000 mg | ORAL_CAPSULE | Freq: Every day | ORAL | Status: DC
  Administered 2021-11-09 – 2021-11-10 (×2): 120 mg via ORAL
  Filled 2021-11-08 (×2): qty 1

## 2021-11-08 MED FILL — Fentanyl Citrate Preservative Free (PF) Inj 100 MCG/2ML: INTRAMUSCULAR | Qty: 1 | Status: AC

## 2021-11-08 NOTE — Progress Notes (Signed)
Mobility Specialist Progress Note   11/08/21 1100  Mobility  Activity Ambulated in room;Transferred:  Bed to chair  Level of Assistance Minimal assist, patient does 75% or more  Assistive Device Front wheel walker  Distance Ambulated (ft) 16 ft  Mobility Ambulated with assistance in room;Sit up in bed/chair position for meals  Mobility Response Tolerated fair  Mobility performed by Mobility specialist  $Mobility charge 1 Mobility   Pt c/o RLQ pain before session, stated to be 4/10 but agreeable to mobility. Required minA for trunk support to get OOB. MinA STS for physical support and contact guard during ambulation. Ambulated on RA before needing a standing break d/t fatigue and DOE. Pt desat to 80% SpO2 and requested to take a seated break. While sitting, placed pt back on 2LO2, pt only resat to 82% SpO2 after ~94mins. Momentarily raised pt to 3LO2 and pt resat to >90% SPO2 after ~37mins. Session limited d/t pt's O2 levels and will follow back up in afternoon to continue session. RN notified and pt left on 2LO2.   Pre Mobility: 87 HR, 91% SpO2 During Mobility: 112 HR, 80% SpO2 Post Mobility: 106 HR, 90% SpO2  Frederico Hamman Mobility Specialist Phone Number [phone]
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
## 2021-11-09 ENCOUNTER — Encounter (HOSPITAL_COMMUNITY): Admission: EM | Disposition: A | Discharge status: Skilled Nursing Facility | Payer: Self-pay | Source: Home / Self Care

## 2021-11-09 ENCOUNTER — Encounter (HOSPITAL_COMMUNITY): Payer: Self-pay

## 2021-11-09 ENCOUNTER — Encounter (HOSPITAL_COMMUNITY): Payer: Self-pay | Admitting: General Practice

## 2021-11-09 LAB — BASIC METABOLIC PANEL
Anion gap: 8 (ref 5–15)
BUN: 11 mg/dL (ref 8–23)
CO2: 28 mmol/L (ref 22–32)
Calcium: 8.5 mg/dL — ABNORMAL LOW (ref 8.9–10.3)
Chloride: 87 mmol/L — ABNORMAL LOW (ref 98–111)
Creatinine, Ser: 0.78 mg/dL (ref 0.44–1.00)
GFR, Estimated: >60 mL/min (ref >60)
Glucose, Bld: 126 mg/dL — ABNORMAL HIGH (ref 70–99)
Potassium: 4.1 mmol/L (ref 3.5–5.1)
Sodium: 123 mmol/L — ABNORMAL LOW (ref 135–145)

## 2021-11-09 LAB — RESP PANEL BY RT-PCR (FLU A&B, COVID) ARPGX2
Influenza A by PCR: NEGATIVE
Influenza B by PCR: NEGATIVE
SARS Coronavirus 2 by RT PCR: NEGATIVE

## 2021-11-09 SURGERY — ECHOCARDIOGRAM, TRANSESOPHAGEAL
Anesthesia: General

## 2021-11-09 MED ORDER — FUROSEMIDE 10 MG/ML IJ SOLN
40.0000 mg | Freq: Once | INTRAMUSCULAR | Status: AC
  Administered 2021-11-09: 40 mg via INTRAVENOUS
  Filled 2021-11-09: qty 4

## 2021-11-09 MED ORDER — LORAZEPAM 0.5 MG PO TABS
0.5000 mg | ORAL_TABLET | Freq: Four times a day (QID) | ORAL | Status: DC | PRN
  Administered 2021-11-10: 13:00:00 0.5 mg via ORAL
  Filled 2021-11-09: qty 1

## 2021-11-09 NOTE — TOC Progression Note (Signed)
Transition of Care [facility]) - Progression Note    Patient Details  Name: Cheryl Peterson MRN: [id number] Date of Birth: 1930-10-01  [facility] CM/SW Contact  Glennon Mac, RN Phone Number: 11/09/2021, 1:26 PM  Clinical Narrative:    Patient has been accepted for admission to [facility], pending insurance authorization.  Spoke with French Ana in admissions at facility; she states that currently authorization remains pending. TOC will continue to follow for updates.    Expected Discharge Plan: Skilled Nursing Facility Barriers to Discharge: Continued Medical Work up  Expected Discharge Plan and Services Expected Discharge Plan: Skilled Nursing Facility   Discharge Planning Services: CM Consult Post Acute Care Choice: Skilled Nursing Facility Living arrangements for the past 2 months: Independent Living Facility Transport planner)                                       Social Determinants of Health (SDOH) Interventions    Readmission Risk Interventions No flowsheet data found.  Quintella Baton, RN, BSN  Trauma/Neuro ICU Case Manager [phone]
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
## 2021-11-09 NOTE — TOC Progression Note (Signed)
Transition of Care [facility]) - Progression Note    Patient Details  Name: Cheryl Peterson MRN: [id number] Date of Birth: Feb 16, 1930  Transition of Care [facility]) CM/SW Contact  Erin Sons, [place] Phone Number: 11/09/2021, 4:21 PM  Clinical Narrative:     CSW uploaded recent therapy notes to [facility] for SNF auth per request from [place] with [facility].   Expected Discharge Plan: Skilled Nursing Facility Barriers to Discharge: Continued Medical Work up  Expected Discharge Plan and Services Expected Discharge Plan: Skilled Nursing Facility   Discharge Planning Services: CM Consult Post Acute Care Choice: Skilled Nursing Facility Living arrangements for the past 2 months: Independent Living Facility Transport planner)                                       Social Determinants of Health (SDOH) Interventions    Readmission Risk Interventions No flowsheet data found.
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
## 2021-11-10 DIAGNOSIS — S2220XB Unspecified fracture of sternum, initial encounter for open fracture: Secondary | ICD-10-CM | POA: Diagnosis not present

## 2021-11-10 DIAGNOSIS — R739 Hyperglycemia, unspecified: Secondary | ICD-10-CM | POA: Diagnosis not present

## 2021-11-10 DIAGNOSIS — S2241XD Multiple fractures of ribs, right side, subsequent encounter for fracture with routine healing: Secondary | ICD-10-CM | POA: Diagnosis not present

## 2021-11-10 DIAGNOSIS — R531 Weakness: Secondary | ICD-10-CM | POA: Diagnosis not present

## 2021-11-10 DIAGNOSIS — F419 Anxiety disorder, unspecified: Secondary | ICD-10-CM | POA: Diagnosis not present

## 2021-11-10 DIAGNOSIS — N1831 Chronic kidney disease, stage 3a: Secondary | ICD-10-CM | POA: Diagnosis not present

## 2021-11-10 DIAGNOSIS — S2220XD Unspecified fracture of sternum, subsequent encounter for fracture with routine healing: Secondary | ICD-10-CM | POA: Diagnosis not present

## 2021-11-10 DIAGNOSIS — S2243XA Multiple fractures of ribs, bilateral, initial encounter for closed fracture: Secondary | ICD-10-CM | POA: Diagnosis not present

## 2021-11-10 DIAGNOSIS — S2220XA Unspecified fracture of sternum, initial encounter for closed fracture: Secondary | ICD-10-CM | POA: Diagnosis not present

## 2021-11-10 DIAGNOSIS — J918 Pleural effusion in other conditions classified elsewhere: Secondary | ICD-10-CM | POA: Diagnosis not present

## 2021-11-10 DIAGNOSIS — R0600 Dyspnea, unspecified: Secondary | ICD-10-CM | POA: Diagnosis not present

## 2021-11-10 DIAGNOSIS — J939 Pneumothorax, unspecified: Secondary | ICD-10-CM | POA: Diagnosis not present

## 2021-11-10 DIAGNOSIS — Z79899 Other long term (current) drug therapy: Secondary | ICD-10-CM | POA: Diagnosis not present

## 2021-11-10 DIAGNOSIS — Z743 Need for continuous supervision: Secondary | ICD-10-CM | POA: Diagnosis not present

## 2021-11-10 DIAGNOSIS — I484 Atypical atrial flutter: Secondary | ICD-10-CM | POA: Diagnosis not present

## 2021-11-10 DIAGNOSIS — I4892 Unspecified atrial flutter: Secondary | ICD-10-CM | POA: Diagnosis not present

## 2021-11-10 DIAGNOSIS — R5381 Other malaise: Secondary | ICD-10-CM | POA: Diagnosis not present

## 2021-11-10 DIAGNOSIS — S2242XD Multiple fractures of ribs, left side, subsequent encounter for fracture with routine healing: Secondary | ICD-10-CM | POA: Diagnosis not present

## 2021-11-10 DIAGNOSIS — S2243XS Multiple fractures of ribs, bilateral, sequela: Secondary | ICD-10-CM | POA: Diagnosis not present

## 2021-11-10 DIAGNOSIS — E871 Hypo-osmolality and hyponatremia: Secondary | ICD-10-CM | POA: Diagnosis not present

## 2021-11-10 DIAGNOSIS — N179 Acute kidney failure, unspecified: Secondary | ICD-10-CM | POA: Diagnosis not present

## 2021-11-10 DIAGNOSIS — R7989 Other specified abnormal findings of blood chemistry: Secondary | ICD-10-CM | POA: Diagnosis not present

## 2021-11-10 DIAGNOSIS — J90 Pleural effusion, not elsewhere classified: Secondary | ICD-10-CM | POA: Diagnosis not present

## 2021-11-10 LAB — BASIC METABOLIC PANEL
Anion gap: 6 (ref 5–15)
BUN: 9 mg/dL (ref 8–23)
CO2: 32 mmol/L (ref 22–32)
Calcium: 8.3 mg/dL — ABNORMAL LOW (ref 8.9–10.3)
Chloride: 89 mmol/L — ABNORMAL LOW (ref 98–111)
Creatinine, Ser: 0.84 mg/dL (ref 0.44–1.00)
GFR, Estimated: >60 mL/min (ref >60)
Glucose, Bld: 113 mg/dL — ABNORMAL HIGH (ref 70–99)
Potassium: 3.9 mmol/L (ref 3.5–5.1)
Sodium: 127 mmol/L — ABNORMAL LOW (ref 135–145)

## 2021-11-10 MED ORDER — LORAZEPAM 0.5 MG PO TABS
0.5000 mg | ORAL_TABLET | Freq: Four times a day (QID) | ORAL | 0 refills | Status: DC | PRN
Start: 2021-11-10 — End: 2021-12-01

## 2021-11-10 MED ORDER — ACETAMINOPHEN 500 MG PO TABS: 1000.0000 mg | ORAL_TABLET | Freq: Three times a day (TID) | ORAL | 0 refills | Status: DC

## 2021-11-10 MED ORDER — METOPROLOL TARTRATE 25 MG PO TABS: 25.0000 mg | ORAL_TABLET | Freq: Two times a day (BID) | ORAL | Status: DC

## 2021-11-10 MED ORDER — DILTIAZEM HCL ER COATED BEADS 120 MG PO CP24: 120.0000 mg | ORAL_CAPSULE | Freq: Every day | ORAL | Status: DC

## 2021-11-10 MED ORDER — SODIUM CHLORIDE 1 G PO TABS: 2.0000 g | ORAL_TABLET | Freq: Three times a day (TID) | ORAL | Status: DC

## 2021-11-10 MED ORDER — APIXABAN 5 MG PO TABS: 5.0000 mg | ORAL_TABLET | Freq: Two times a day (BID) | ORAL | Status: DC

## 2021-11-10 MED ORDER — IBUPROFEN 200 MG PO TABS
200.0000 mg | ORAL_TABLET | Freq: Three times a day (TID) | ORAL | 0 refills | Status: DC | PRN
Start: 2021-11-10 — End: 2021-11-26

## 2021-11-10 NOTE — TOC Transition Note (Signed)
Transition of Care [facility]) - CM/SW Discharge Note   Patient Details  Name: Cheryl Peterson MRN: [id number] Date of Birth: November 13, 1930  [facility] CM/SW Contact:  Glennon Mac, RN Phone Number: 11/10/2021, 1:02 PM   Clinical Narrative:    Pt medically stable for discharge today, and insurance authorization has been received for SNF.  Bed available at [facility] today; plan transport by [employer], for pickup at 2:30, per [employer] with transport agency.  Patient discharging to room 209 at facility; bedside nurse notified to call report to [phone].  Notified pt and son [name].     Final next level of care: Skilled Nursing Facility Barriers to Discharge: Barriers Resolved   Patient Goals and CMS Choice Patient states their goals for this hospitalization and ongoing recovery are:: to get better CMS Medicare.gov Compare Post Acute Care list provided to:: Patient Choice offered to / list presented to : Patient, Adult Children  Discharge Placement PASRR number recieved: 11/03/21            Patient chooses bed at: [facility] Patient to be transferred to facility by: [facility] 2:30 pickup Name of family member notified: Son, [name] Patient and family notified of of transfer: 11/10/21  Discharge Plan and Services   Discharge Planning Services: CM Consult Post Acute Care Choice: Skilled Nursing Facility                               Social Determinants of Health (SDOH) Interventions     Readmission Risk Interventions No flowsheet data found.   Quintella Baton, RN, BSN  Trauma/Neuro ICU Case Manager [phone]

## 2021-11-10 NOTE — Plan of Care (Signed)
  Problem: Education: Goal: Knowledge of General Education information will improve Description: Including pain rating scale, medication(s)/side effects and non-pharmacologic comfort measures Outcome: Adequate for Discharge   Problem: Health Behavior/Discharge Planning: Goal: Ability to manage health-related needs will improve Outcome: Adequate for Discharge   Problem: Clinical Measurements: Goal: Ability to maintain clinical measurements within normal limits will improve Outcome: Adequate for Discharge Goal: Will remain free from infection Outcome: Adequate for Discharge Goal: Diagnostic test results will improve Outcome: Adequate for Discharge Goal: Respiratory complications will improve Outcome: Adequate for Discharge Goal: Cardiovascular complication will be avoided Outcome: Adequate for Discharge   Problem: Activity: Goal: Risk for activity intolerance will decrease Outcome: Adequate for Discharge   Problem: Nutrition: Goal: Adequate nutrition will be maintained Outcome: Adequate for Discharge   Problem: Coping: Goal: Level of anxiety will decrease Outcome: Adequate for Discharge   Problem: Elimination: Goal: Will not experience complications related to bowel motility Outcome: Adequate for Discharge Goal: Will not experience complications related to urinary retention Outcome: Adequate for Discharge   Problem: Pain Managment: Goal: General experience of comfort will improve Outcome: Adequate for Discharge   Problem: Safety: Goal: Ability to remain free from injury will improve Outcome: Adequate for Discharge   Problem: Skin Integrity: Goal: Risk for impaired skin integrity will decrease Outcome: Adequate for Discharge   Problem: Cardiac: Goal: Ability to achieve and maintain adequate cardiopulmonary perfusion will improve Outcome: Adequate for Discharge   
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
## 2021-11-13 DIAGNOSIS — F419 Anxiety disorder, unspecified: Secondary | ICD-10-CM | POA: Diagnosis not present

## 2021-11-13 DIAGNOSIS — S2243XS Multiple fractures of ribs, bilateral, sequela: Secondary | ICD-10-CM | POA: Diagnosis not present

## 2021-11-13 DIAGNOSIS — J918 Pleural effusion in other conditions classified elsewhere: Secondary | ICD-10-CM | POA: Diagnosis not present

## 2021-11-13 DIAGNOSIS — E871 Hypo-osmolality and hyponatremia: Secondary | ICD-10-CM | POA: Diagnosis not present

## 2021-11-13 DIAGNOSIS — R0600 Dyspnea, unspecified: Secondary | ICD-10-CM | POA: Diagnosis not present

## 2021-11-13 DIAGNOSIS — I4892 Unspecified atrial flutter: Secondary | ICD-10-CM | POA: Diagnosis not present

## 2021-11-13 DIAGNOSIS — S2220XB Unspecified fracture of sternum, initial encounter for open fracture: Secondary | ICD-10-CM | POA: Diagnosis not present

## 2021-11-15 ENCOUNTER — Emergency Department (HOSPITAL_COMMUNITY): Payer: Medicare PPO

## 2021-11-15 ENCOUNTER — Other Ambulatory Visit: Payer: Self-pay

## 2021-11-15 ENCOUNTER — Inpatient Hospital Stay (HOSPITAL_COMMUNITY)
Admission: EM | Admit: 2021-11-15 | Discharge: 2021-11-26 | DRG: 187 | Disposition: A | Discharge status: Home / Self Care | Payer: Medicare PPO | Attending: General Surgery | Admitting: General Surgery

## 2021-11-15 ENCOUNTER — Encounter (HOSPITAL_COMMUNITY): Payer: Self-pay

## 2021-11-15 DIAGNOSIS — R531 Weakness: Secondary | ICD-10-CM | POA: Diagnosis not present

## 2021-11-15 DIAGNOSIS — S2243XA Multiple fractures of ribs, bilateral, initial encounter for closed fracture: Secondary | ICD-10-CM | POA: Diagnosis present

## 2021-11-15 DIAGNOSIS — Z885 Allergy status to narcotic agent status: Secondary | ICD-10-CM

## 2021-11-15 DIAGNOSIS — M25571 Pain in right ankle and joints of right foot: Secondary | ICD-10-CM | POA: Diagnosis present

## 2021-11-15 DIAGNOSIS — I480 Paroxysmal atrial fibrillation: Secondary | ICD-10-CM | POA: Diagnosis present

## 2021-11-15 DIAGNOSIS — Z87891 Personal history of nicotine dependence: Secondary | ICD-10-CM

## 2021-11-15 DIAGNOSIS — J90 Pleural effusion, not elsewhere classified: Secondary | ICD-10-CM | POA: Diagnosis present

## 2021-11-15 DIAGNOSIS — Z880 Allergy status to penicillin: Secondary | ICD-10-CM

## 2021-11-15 DIAGNOSIS — I517 Cardiomegaly: Secondary | ICD-10-CM | POA: Diagnosis not present

## 2021-11-15 DIAGNOSIS — S2231XA Fracture of one rib, right side, initial encounter for closed fracture: Secondary | ICD-10-CM | POA: Diagnosis not present

## 2021-11-15 DIAGNOSIS — J939 Pneumothorax, unspecified: Secondary | ICD-10-CM | POA: Diagnosis not present

## 2021-11-15 DIAGNOSIS — Z20822 Contact with and (suspected) exposure to covid-19: Secondary | ICD-10-CM | POA: Diagnosis present

## 2021-11-15 DIAGNOSIS — Z823 Family history of stroke: Secondary | ICD-10-CM | POA: Diagnosis not present

## 2021-11-15 DIAGNOSIS — Z832 Family history of diseases of the blood and blood-forming organs and certain disorders involving the immune mechanism: Secondary | ICD-10-CM | POA: Diagnosis not present

## 2021-11-15 DIAGNOSIS — I4892 Unspecified atrial flutter: Secondary | ICD-10-CM | POA: Diagnosis present

## 2021-11-15 DIAGNOSIS — Y92410 Unspecified street and highway as the place of occurrence of the external cause: Secondary | ICD-10-CM

## 2021-11-15 DIAGNOSIS — N1831 Chronic kidney disease, stage 3a: Secondary | ICD-10-CM | POA: Diagnosis present

## 2021-11-15 DIAGNOSIS — Z48813 Encounter for surgical aftercare following surgery on the respiratory system: Secondary | ICD-10-CM | POA: Diagnosis not present

## 2021-11-15 DIAGNOSIS — R000 Tachycardia, unspecified: Secondary | ICD-10-CM | POA: Diagnosis not present

## 2021-11-15 DIAGNOSIS — E871 Hypo-osmolality and hyponatremia: Secondary | ICD-10-CM | POA: Diagnosis present

## 2021-11-15 DIAGNOSIS — S2220XA Unspecified fracture of sternum, initial encounter for closed fracture: Secondary | ICD-10-CM | POA: Diagnosis present

## 2021-11-15 DIAGNOSIS — I471 Supraventricular tachycardia: Secondary | ICD-10-CM | POA: Diagnosis present

## 2021-11-15 DIAGNOSIS — J9811 Atelectasis: Secondary | ICD-10-CM | POA: Diagnosis present

## 2021-11-15 DIAGNOSIS — R0602 Shortness of breath: Secondary | ICD-10-CM

## 2021-11-15 DIAGNOSIS — R0600 Dyspnea, unspecified: Secondary | ICD-10-CM | POA: Diagnosis not present

## 2021-11-15 DIAGNOSIS — Z5181 Encounter for therapeutic drug level monitoring: Secondary | ICD-10-CM | POA: Diagnosis not present

## 2021-11-15 DIAGNOSIS — R918 Other nonspecific abnormal finding of lung field: Secondary | ICD-10-CM | POA: Diagnosis not present

## 2021-11-15 DIAGNOSIS — S2220XS Unspecified fracture of sternum, sequela: Secondary | ICD-10-CM

## 2021-11-15 DIAGNOSIS — R509 Fever, unspecified: Secondary | ICD-10-CM | POA: Diagnosis not present

## 2021-11-15 DIAGNOSIS — I484 Atypical atrial flutter: Secondary | ICD-10-CM

## 2021-11-15 DIAGNOSIS — D649 Anemia, unspecified: Secondary | ICD-10-CM | POA: Diagnosis not present

## 2021-11-15 DIAGNOSIS — Z79899 Other long term (current) drug therapy: Secondary | ICD-10-CM | POA: Diagnosis not present

## 2021-11-15 DIAGNOSIS — I4891 Unspecified atrial fibrillation: Secondary | ICD-10-CM | POA: Diagnosis not present

## 2021-11-15 DIAGNOSIS — I130 Hypertensive heart and chronic kidney disease with heart failure and stage 1 through stage 4 chronic kidney disease, or unspecified chronic kidney disease: Secondary | ICD-10-CM | POA: Diagnosis present

## 2021-11-15 DIAGNOSIS — S2243XD Multiple fractures of ribs, bilateral, subsequent encounter for fracture with routine healing: Secondary | ICD-10-CM | POA: Diagnosis not present

## 2021-11-15 DIAGNOSIS — I10 Essential (primary) hypertension: Secondary | ICD-10-CM | POA: Diagnosis not present

## 2021-11-15 DIAGNOSIS — R609 Edema, unspecified: Secondary | ICD-10-CM | POA: Diagnosis not present

## 2021-11-15 DIAGNOSIS — S2243XS Multiple fractures of ribs, bilateral, sequela: Secondary | ICD-10-CM

## 2021-11-15 DIAGNOSIS — E785 Hyperlipidemia, unspecified: Secondary | ICD-10-CM | POA: Diagnosis present

## 2021-11-15 DIAGNOSIS — Z9889 Other specified postprocedural states: Secondary | ICD-10-CM

## 2021-11-15 DIAGNOSIS — M79601 Pain in right arm: Secondary | ICD-10-CM | POA: Diagnosis not present

## 2021-11-15 DIAGNOSIS — S2220XD Unspecified fracture of sternum, subsequent encounter for fracture with routine healing: Secondary | ICD-10-CM | POA: Diagnosis not present

## 2021-11-15 DIAGNOSIS — I700 Atherosclerosis of aorta: Secondary | ICD-10-CM | POA: Diagnosis not present

## 2021-11-15 DIAGNOSIS — R0902 Hypoxemia: Secondary | ICD-10-CM | POA: Diagnosis not present

## 2021-11-15 DIAGNOSIS — Z8249 Family history of ischemic heart disease and other diseases of the circulatory system: Secondary | ICD-10-CM

## 2021-11-15 DIAGNOSIS — R404 Transient alteration of awareness: Secondary | ICD-10-CM | POA: Diagnosis not present

## 2021-11-15 HISTORY — DX: Unspecified atrial fibrillation: I48.91

## 2021-11-15 LAB — CBC WITH DIFFERENTIAL/PLATELET
Abs Immature Granulocytes: 0.05 10*3/uL (ref 0.00–0.07)
Basophils Absolute: 0 10*3/uL (ref 0.0–0.1)
Basophils Relative: 0 %
Eosinophils Absolute: 0.2 10*3/uL (ref 0.0–0.5)
Eosinophils Relative: 2 %
HCT: 33.8 % — ABNORMAL LOW (ref 36.0–46.0)
Hemoglobin: 10.9 g/dL — ABNORMAL LOW (ref 12.0–15.0)
Immature Granulocytes: 1 %
Lymphocytes Relative: 17 %
Lymphs Abs: 1.9 10*3/uL (ref 0.7–4.0)
MCH: 31.1 pg (ref 26.0–34.0)
MCHC: 32.2 g/dL (ref 30.0–36.0)
MCV: 96.3 fL (ref 80.0–100.0)
Monocytes Absolute: 0.9 10*3/uL (ref 0.1–1.0)
Monocytes Relative: 8 %
Neutro Abs: 7.8 10*3/uL — ABNORMAL HIGH (ref 1.7–7.7)
Neutrophils Relative %: 72 %
Platelets: 526 10*3/uL — ABNORMAL HIGH (ref 150–400)
RBC: 3.51 MIL/uL — ABNORMAL LOW (ref 3.87–5.11)
RDW: 13.1 % (ref 11.5–15.5)
WBC: 10.8 10*3/uL — ABNORMAL HIGH (ref 4.0–10.5)
nRBC: 0 % (ref 0.0–0.2)

## 2021-11-15 LAB — CBG MONITORING, ED: Glucose-Capillary: 134 mg/dL — ABNORMAL HIGH (ref 70–99)

## 2021-11-15 LAB — COMPREHENSIVE METABOLIC PANEL
ALT: 16 U/L (ref 0–44)
AST: 25 U/L (ref 15–41)
Albumin: 2.9 g/dL — ABNORMAL LOW (ref 3.5–5.0)
Alkaline Phosphatase: 115 U/L (ref 38–126)
Anion gap: 6 (ref 5–15)
BUN: 24 mg/dL — ABNORMAL HIGH (ref 8–23)
CO2: 29 mmol/L (ref 22–32)
Calcium: 8.3 mg/dL — ABNORMAL LOW (ref 8.9–10.3)
Chloride: 88 mmol/L — ABNORMAL LOW (ref 98–111)
Creatinine, Ser: 1.06 mg/dL — ABNORMAL HIGH (ref 0.44–1.00)
GFR, Estimated: 50 mL/min — ABNORMAL LOW (ref >60)
Glucose, Bld: 142 mg/dL — ABNORMAL HIGH (ref 70–99)
Potassium: 4.3 mmol/L (ref 3.5–5.1)
Sodium: 123 mmol/L — ABNORMAL LOW (ref 135–145)
Total Bilirubin: 0.6 mg/dL (ref 0.3–1.2)
Total Protein: 6 g/dL — ABNORMAL LOW (ref 6.5–8.1)

## 2021-11-15 LAB — RESP PANEL BY RT-PCR (FLU A&B, COVID) ARPGX2
Influenza A by PCR: NEGATIVE
Influenza B by PCR: NEGATIVE
SARS Coronavirus 2 by RT PCR: NEGATIVE

## 2021-11-15 LAB — LIPASE, BLOOD: Lipase: 28 U/L (ref 11–51)

## 2021-11-15 LAB — PROTIME-INR
INR: 1.5 — ABNORMAL HIGH (ref 0.8–1.2)
Prothrombin Time: 18.5 seconds — ABNORMAL HIGH (ref 11.4–15.2)

## 2021-11-15 LAB — BLOOD GAS, VENOUS
Acid-Base Excess: 4.3 mmol/L — ABNORMAL HIGH (ref 0.0–2.0)
Bicarbonate: 32.7 mmol/L — ABNORMAL HIGH (ref 20.0–28.0)
O2 Saturation: 54.5 %
Patient temperature: 98.6
pCO2, Ven: 75.3 mmHg (ref 44.0–60.0)
pH, Ven: 7.261 (ref 7.250–7.430)
pO2, Ven: 31.8 mmHg — CL (ref 32.0–45.0)

## 2021-11-15 LAB — BRAIN NATRIURETIC PEPTIDE: B Natriuretic Peptide: 607 pg/mL — ABNORMAL HIGH (ref 0.0–100.0)

## 2021-11-15 LAB — LACTIC ACID, PLASMA: Lactic Acid, Venous: 0.8 mmol/L (ref 0.5–1.9)

## 2021-11-15 LAB — TROPONIN I (HIGH SENSITIVITY): Troponin I (High Sensitivity): 17 ng/L (ref <18)
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
## 2021-11-16 ENCOUNTER — Inpatient Hospital Stay (HOSPITAL_COMMUNITY): Payer: Medicare PPO

## 2021-11-16 DIAGNOSIS — Z880 Allergy status to penicillin: Secondary | ICD-10-CM | POA: Diagnosis not present

## 2021-11-16 DIAGNOSIS — N1831 Chronic kidney disease, stage 3a: Secondary | ICD-10-CM | POA: Diagnosis present

## 2021-11-16 DIAGNOSIS — Z885 Allergy status to narcotic agent status: Secondary | ICD-10-CM | POA: Diagnosis not present

## 2021-11-16 DIAGNOSIS — Y92410 Unspecified street and highway as the place of occurrence of the external cause: Secondary | ICD-10-CM | POA: Diagnosis not present

## 2021-11-16 DIAGNOSIS — E871 Hypo-osmolality and hyponatremia: Secondary | ICD-10-CM | POA: Diagnosis present

## 2021-11-16 DIAGNOSIS — D649 Anemia, unspecified: Secondary | ICD-10-CM | POA: Diagnosis not present

## 2021-11-16 DIAGNOSIS — Z79899 Other long term (current) drug therapy: Secondary | ICD-10-CM | POA: Diagnosis not present

## 2021-11-16 DIAGNOSIS — I4892 Unspecified atrial flutter: Secondary | ICD-10-CM | POA: Diagnosis present

## 2021-11-16 DIAGNOSIS — Z832 Family history of diseases of the blood and blood-forming organs and certain disorders involving the immune mechanism: Secondary | ICD-10-CM | POA: Diagnosis not present

## 2021-11-16 DIAGNOSIS — Z87891 Personal history of nicotine dependence: Secondary | ICD-10-CM | POA: Diagnosis not present

## 2021-11-16 DIAGNOSIS — Z5181 Encounter for therapeutic drug level monitoring: Secondary | ICD-10-CM | POA: Diagnosis not present

## 2021-11-16 DIAGNOSIS — J9811 Atelectasis: Secondary | ICD-10-CM | POA: Diagnosis present

## 2021-11-16 DIAGNOSIS — E785 Hyperlipidemia, unspecified: Secondary | ICD-10-CM | POA: Diagnosis present

## 2021-11-16 DIAGNOSIS — Z8249 Family history of ischemic heart disease and other diseases of the circulatory system: Secondary | ICD-10-CM | POA: Diagnosis not present

## 2021-11-16 DIAGNOSIS — Z823 Family history of stroke: Secondary | ICD-10-CM | POA: Diagnosis not present

## 2021-11-16 DIAGNOSIS — I471 Supraventricular tachycardia: Secondary | ICD-10-CM | POA: Diagnosis present

## 2021-11-16 DIAGNOSIS — M25571 Pain in right ankle and joints of right foot: Secondary | ICD-10-CM | POA: Diagnosis present

## 2021-11-16 DIAGNOSIS — M79601 Pain in right arm: Secondary | ICD-10-CM | POA: Diagnosis not present

## 2021-11-16 DIAGNOSIS — J90 Pleural effusion, not elsewhere classified: Secondary | ICD-10-CM | POA: Diagnosis present

## 2021-11-16 DIAGNOSIS — S2220XA Unspecified fracture of sternum, initial encounter for closed fracture: Secondary | ICD-10-CM | POA: Diagnosis present

## 2021-11-16 DIAGNOSIS — S2243XA Multiple fractures of ribs, bilateral, initial encounter for closed fracture: Secondary | ICD-10-CM | POA: Diagnosis present

## 2021-11-16 DIAGNOSIS — I480 Paroxysmal atrial fibrillation: Secondary | ICD-10-CM | POA: Diagnosis present

## 2021-11-16 DIAGNOSIS — Z20822 Contact with and (suspected) exposure to covid-19: Secondary | ICD-10-CM | POA: Diagnosis present

## 2021-11-16 DIAGNOSIS — I130 Hypertensive heart and chronic kidney disease with heart failure and stage 1 through stage 4 chronic kidney disease, or unspecified chronic kidney disease: Secondary | ICD-10-CM | POA: Diagnosis present

## 2021-11-16 LAB — LACTIC ACID, PLASMA: Lactic Acid, Venous: 0.7 mmol/L (ref 0.5–1.9)

## 2021-11-16 LAB — TROPONIN I (HIGH SENSITIVITY): Troponin I (High Sensitivity): 17 ng/L (ref <18)

## 2021-11-16 MED ORDER — METOPROLOL TARTRATE 50 MG PO TABS: 50.0000 mg | ORAL_TABLET | Freq: Four times a day (QID) | ORAL | Status: DC | PRN

## 2021-11-16 MED ORDER — ENOXAPARIN SODIUM 30 MG/0.3ML IJ SOSY
30.0000 mg | PREFILLED_SYRINGE | Freq: Two times a day (BID) | INTRAMUSCULAR | Status: DC
  Administered 2021-11-17 – 2021-11-25 (×17): 30 mg via SUBCUTANEOUS
  Filled 2021-11-16 (×17): qty 0.3

## 2021-11-16 MED ORDER — METOPROLOL TARTRATE 5 MG/5ML IV SOLN
INTRAVENOUS | Status: AC
  Administered 2021-11-16: 09:00:00 5 mg via INTRAVENOUS
  Filled 2021-11-16: qty 5

## 2021-11-16 MED ORDER — ALPRAZOLAM 0.25 MG PO TABS
0.2500 mg | ORAL_TABLET | Freq: Three times a day (TID) | ORAL | Status: DC | PRN
Start: 2021-11-16 — End: 2021-11-26
  Administered 2021-11-16: 17:00:00 0.5 mg via ORAL
  Administered 2021-11-17 (×3): 0.25 mg via ORAL
  Administered 2021-11-19: 10:00:00 0.5 mg via ORAL
  Administered 2021-11-19: 20:00:00 0.25 mg via ORAL
  Administered 2021-11-20 – 2021-11-26 (×12): 0.5 mg via ORAL
  Filled 2021-11-16: qty 1
  Filled 2021-11-16 (×5): qty 2
  Filled 2021-11-16: qty 1
  Filled 2021-11-16 (×5): qty 2
  Filled 2021-11-16: qty 1
  Filled 2021-11-16 (×5): qty 2
  Filled 2021-11-16: qty 1
  Filled 2021-11-16 (×2): qty 2

## 2021-11-16 MED ORDER — AMIODARONE HCL IN DEXTROSE 360-4.14 MG/200ML-% IV SOLN
60.0000 mg/h | INTRAVENOUS | Status: DC
  Administered 2021-11-16 (×2): 30 mg/h via INTRAVENOUS
  Administered 2021-11-17 – 2021-11-18 (×8): 60 mg/h via INTRAVENOUS
  Administered 2021-11-19: 07:00:00 30 mg/h via INTRAVENOUS
  Filled 2021-11-16 (×10): qty 200

## 2021-11-16 MED ORDER — DOCUSATE SODIUM 100 MG PO CAPS
100.0000 mg | ORAL_CAPSULE | Freq: Two times a day (BID) | ORAL | Status: DC
  Administered 2021-11-17 – 2021-11-26 (×18): 100 mg via ORAL
  Filled 2021-11-16 (×19): qty 1

## 2021-11-16 MED ORDER — AMIODARONE LOAD VIA INFUSION
150.0000 mg | Freq: Once | INTRAVENOUS | Status: AC
  Administered 2021-11-16: 12:00:00 150 mg via INTRAVENOUS
  Filled 2021-11-16 (×5): qty 83.34

## 2021-11-16 MED ORDER — LIDOCAINE HCL 1 % IJ SOLN
INTRAMUSCULAR | Status: AC
  Filled 2021-11-16: qty 20

## 2021-11-16 MED ORDER — METOPROLOL TARTRATE 5 MG/5ML IV SOLN: 5.0000 mg | Freq: Once | INTRAVENOUS | Status: AC

## 2021-11-16 MED ORDER — ACETAMINOPHEN 325 MG PO TABS
650.0000 mg | ORAL_TABLET | ORAL | Status: DC | PRN
  Administered 2021-11-17 – 2021-11-25 (×7): 650 mg via ORAL
  Filled 2021-11-16 (×9): qty 2

## 2021-11-16 MED ORDER — AMIODARONE HCL IN DEXTROSE 360-4.14 MG/200ML-% IV SOLN
60.0000 mg/h | INTRAVENOUS | Status: DC
  Administered 2021-11-16: 12:00:00 60 mg/h via INTRAVENOUS
  Filled 2021-11-16: qty 200

## 2021-11-16 NOTE — ED Notes (Signed)
Pt placed on bedpan at this time.

## 2021-11-16 NOTE — ED Notes (Signed)
MD notified after Lopressor administration, pt HR continues to stay in the 140s bpm. Dr.Burke states he spoke to cardiology who is coming to see pt soon. Will continue to monitor.

## 2021-11-16 NOTE — ED Notes (Signed)
Spoke to [employer] from [employer] who states in order to do thoracentesis. HR needs to be close to 100 bpm. Will relay message to cardiologist.

## 2021-11-16 NOTE — TOC CAGE-AID Note (Signed)
Transition of Care [facility]) - CAGE-AID Screening   Patient Details  Name: LORIENE TAUNTON MRN: [id number] Date of Birth: 08-Oct-1930  [facility]) CM/SW Contact:    Rielyn Krupinski C Tarpley-Carter, LCSWA Phone Number: 11/16/2021, 9:33 AM   Clinical Narrative: Pt participated in Cage-Aid.  Pt stated she does not use substance or ETOH.  Pt was not offered resources, due to no usage of substance or ETOH.     Aloura Matsuoka Tarpley-Carter, MSW, LCSW-A Pronouns:  She/Her/Hers [place] Transitions of Care Clinical Social Worker Direct Number:  [phone] [email or web address]  CAGE-AID Screening:    Have You Ever Felt You Ought to Cut Down on Your Drinking or Drug Use?: No Have People Annoyed You By [employer] Your Drinking Or Drug Use?: No Have You Felt Bad Or Guilty About Your Drinking Or Drug Use?: No Have You Ever Had a Drink or Used Drugs First Thing In The Morning to Steady Your Nerves or to Get Rid of a Hangover?: No CAGE-AID Score: 0  Substance Abuse Education Offered: No

## 2021-11-16 NOTE — Progress Notes (Signed)
Pt arrived to unit from ED, A/O x 4 with mild confusion,  CCMD called ,CHG given, pt oriented to unit,Will continue to monitor.   Karna Christmas Alessia Gonsalez, RN    11/16/21 1624  Vitals  Temp 98 F (36.7 C)  Temp Source Oral  BP (!) 156/101  MAP (mmHg) 117  BP Location Left Wrist  BP Method Automatic  Patient Position (if appropriate) Lying  Pulse Rate (!) 144  Pulse Rate Source Monitor  ECG Heart Rate (!) 144  Resp (!) 29  Level of Consciousness  Level of Consciousness Alert  Oxygen Therapy  SpO2 92 %  O2 Device Nasal Cannula  O2 Flow Rate (L/min) 4 L/min  Pain Assessment  Pain Scale 0-10  Pain Score 0  MEWS Score  MEWS Temp 0  MEWS Systolic 0  MEWS Pulse 3  MEWS RR 2  MEWS LOC 0  MEWS Score 5  MEWS Score Color Red

## 2021-11-16 NOTE — Progress Notes (Signed)
Patient ID: Cheryl Peterson, female   DOB: 04-Aug-1930, 87 y.o.   MRN: [id number] HR remains 145 on Amio per Cards. IR plans thora in AM now. Updated her son at the bedside. Added xanax PRN, diet. NPO at MN. Labs in AM.  Violeta Gelinas, MD, MPH, FACS Please use [email or web address] to contact on call provider

## 2021-11-16 NOTE — ED Notes (Signed)
IR messaged at this time and updated regarding pt HR and condition. Pt HR continues to be 140-145bpm. Alert and oriented x 4. Cardiology is aware. Amiodarone drip done. Continuous infusion now in process. Pt HR dropped to 70s for 5 minutes earlier and came back up to 129-145 bpm. Will continue to monitor.

## 2021-11-16 NOTE — ED Notes (Signed)
Pt placed on purwick at this time.
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
## 2021-11-16 NOTE — ED Notes (Signed)
Dr.Thompson updated at this time regarding pt situation. Waiting for IR to decide at this time if pt can do the procedure with current HR.
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
## 2021-11-16 NOTE — ED Notes (Signed)
[facility] contacted 

## 2021-11-16 NOTE — ED Notes (Signed)
Pt placed on Zoll pads at this time and placed zoll on monitor setting. Pt remains alert and oriented x 4 at this time. O2 increased to 6LPM with O2 at 92% on the monitor. Waiting for trauma team to come see the pt at this time. Will continue to monitor.

## 2021-11-16 NOTE — Progress Notes (Signed)
Patient told NT that she was confused and didn't know where she was. She also asked the NT if she was going to take her to the bus. NT reoriented patient.  Will continue to monitor.

## 2021-11-16 NOTE — ED Notes (Signed)
Spoke to Cardiologist at this time, Dr.Branch who states pt is now in atrial flutter and not in SVT. MD is coming to see pt soon. Will continue to monitor. Pt is alert and oriented x 4 at this time. Pt niece is at bedside talking to pt.

## 2021-11-16 NOTE — ED Notes (Signed)
Spoke to trauma MD at this time who states he will coordinate with cardiology and IR at this time to see what the plan is. Waiting for MD to talk to the family as well. Son at bedside has been frustrated all day regarding the situation. MD states he will get more information and talk to son soon. Will continue to monitor. Pt remains alert and oriented x 4.

## 2021-11-16 NOTE — ED Notes (Signed)
CN talked to the pt family explaining situation. Son is very frustrated with pt current situation. Spoke to [employer] team, Trauma PA states he is coming down to talk to pt family. Plan is for cardiology to manage pt HR and postpone IR today until pt HR becomes stable tomorrow. Explained to trauma team that cardiology placed pt on amiodarone drip with clearance for thoracentesis today. IR states HR is too high for procedure. Waiting for trauma team to talk to pt family. Will continue to monitor.
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
## 2021-11-16 NOTE — ED Notes (Signed)
Attempted to call IR at this time to see when pt is going to IR. Curly Rim is not available. Will call back again.

## 2021-11-16 NOTE — ED Notes (Signed)
Dr.Burke Thompson at bedside. EKG shown to MD. EKG showing SVT at HR rate of 143-149bpm. Pt is alert and oriented x 4. Zoll pad in place. Metoprolol 5mg  IVP given at this time. Will continue to monitor.

## 2021-11-16 NOTE — ED Notes (Addendum)
Dr.Branch in Cards messaged at this time regarding pt HR status. HR continues to stay 138-145bpm. Pt remains alert and oriented x 4. Amiodarone drip started at 1145 with pt HR came down to 70s after the bolus. Pt HR continues to maintain at this time at 138/145bpm. Will continue to monitor. Waiting for new orders from cards.

## 2021-11-16 NOTE — ED Notes (Signed)
Admitting MD paged again. Cardiology did not come. Pt continues to be in SVT with HR rate 140s. Remains alert and oriented x 4. Waiting for orders.

## 2021-11-16 NOTE — ED Notes (Signed)
Cardiologist repaged again at this time. Pt has not seen by cards despite HR of 140s and SVT rhythm on the monitor. Will continue to monitor.
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
## 2021-11-16 NOTE — ED Notes (Signed)
Cardiologist returned call at this time. They are coming down to see pt soon. Will continue to monitor.

## 2021-11-16 NOTE — Progress Notes (Signed)
Patient asked this RN if there was a person sitting in the one chair by the computer, told pt no that there was nobody there. Pt then asked if there was someone sitting in the chair by the window. Again, told the patient there was no one except myself and her.   Will continue to monitor

## 2021-11-16 NOTE — ED Notes (Addendum)
Spoke to Dr.Branch from [facility] at this time. MD states pt is tolerating HR rate at 140s and does not need cardioversion at this time. Pt is cleared by cards to do thoracentesis. Spoke to Casimiro Needle from [employer] at this time who states he will run it by his PA who will do the thoracentesis if procedure is possible with pt HR. Waiting for IR to decide if pt is a candidate for thoracentesis at this time.

## 2021-11-16 NOTE — ED Notes (Signed)
Pt HR constantly has been in the 140s since 0730AM. Trauma RN, Marchelle Folks notified of assessment. EKG done at this time and shows SVT. Henriette Combs is contacting trauma MD now for further orders. Pt remains alert and oriented x 4. Pt denies chest pain but states she feels increasing anxiety at this time. Denies SOB. EKG shown to EDP. Will continue to monitor.

## 2021-11-16 NOTE — ED Notes (Signed)
Spoke to Cardiologist, Dr.Branch at this time who states she is coming to see pt. Pt is now complaining of worsening sob with O2 at 6LPM [place]. Alert and oriented x 4. Curly Rim from IR came to get pt but states he has to push the schedule as he is not comfortable taking pt to IR with current SVT condition with HR 144 bpm. Cardiologist is aware. Will continue to monitor.

## 2021-11-16 NOTE — ED Notes (Signed)
Cardiology paged at this time and sent a secure message. Will continue to monitor.

## 2021-11-16 NOTE — ED Notes (Signed)
Cardiology team at bedside

## 2021-11-16 NOTE — ED Notes (Signed)
Main pharmacy is going to send the bolus dose of Amiodarone soon. Waiting for initial bolus at this time. Pt remains alert and oriented x 4. Talking to her family member at bedside. Will continue to monitor.

## 2021-11-17 ENCOUNTER — Inpatient Hospital Stay (HOSPITAL_COMMUNITY): Payer: Medicare PPO

## 2021-11-17 DIAGNOSIS — I4892 Unspecified atrial flutter: Secondary | ICD-10-CM | POA: Diagnosis not present

## 2021-11-17 HISTORY — PX: IR THORACENTESIS RIGHT ASP PLEURAL SPACE W/IMG GUIDE: IMG5380

## 2021-11-17 LAB — BASIC METABOLIC PANEL
Anion gap: 8 (ref 5–15)
BUN: 11 mg/dL (ref 8–23)
CO2: 32 mmol/L (ref 22–32)
Calcium: 8.3 mg/dL — ABNORMAL LOW (ref 8.9–10.3)
Chloride: 86 mmol/L — ABNORMAL LOW (ref 98–111)
Creatinine, Ser: 0.84 mg/dL (ref 0.44–1.00)
GFR, Estimated: >60 mL/min (ref >60)
Glucose, Bld: 153 mg/dL — ABNORMAL HIGH (ref 70–99)
Potassium: 3.9 mmol/L (ref 3.5–5.1)
Sodium: 126 mmol/L — ABNORMAL LOW (ref 135–145)

## 2021-11-17 LAB — CBC
HCT: 34 % — ABNORMAL LOW (ref 36.0–46.0)
Hemoglobin: 11.6 g/dL — ABNORMAL LOW (ref 12.0–15.0)
MCH: 31.4 pg (ref 26.0–34.0)
MCHC: 34.1 g/dL (ref 30.0–36.0)
MCV: 91.9 fL (ref 80.0–100.0)
Platelets: 490 10*3/uL — ABNORMAL HIGH (ref 150–400)
RBC: 3.7 MIL/uL — ABNORMAL LOW (ref 3.87–5.11)
RDW: 13.2 % (ref 11.5–15.5)
WBC: 11.4 10*3/uL — ABNORMAL HIGH (ref 4.0–10.5)
nRBC: 0 % (ref 0.0–0.2)

## 2021-11-17 MED ORDER — LIDOCAINE HCL 1 % IJ SOLN
INTRAMUSCULAR | Status: AC
  Administered 2021-11-17: 11:00:00 10 mL
  Filled 2021-11-17: qty 20

## 2021-11-17 MED ORDER — AMIODARONE LOAD VIA INFUSION
150.0000 mg | Freq: Once | INTRAVENOUS | Status: AC
  Administered 2021-11-17: 02:00:00 150 mg via INTRAVENOUS
  Filled 2021-11-17: qty 83.34

## 2021-11-17 MED ORDER — AMIODARONE IV BOLUS ONLY 150 MG/100ML: 150.0000 mg | Freq: Once | INTRAVENOUS | Status: DC

## 2021-11-17 MED ORDER — LORAZEPAM 2 MG/ML IJ SOLN: 0.5000 mg | Freq: Once | INTRAMUSCULAR | Status: DC

## 2021-11-17 MED ORDER — ALPRAZOLAM 0.25 MG PO TABS: 0.2500 mg | ORAL_TABLET | Freq: Once | ORAL | Status: DC | PRN

## 2021-11-17 MED ORDER — HYDRALAZINE HCL 20 MG/ML IJ SOLN: 10.0000 mg | Freq: Four times a day (QID) | INTRAMUSCULAR | Status: DC | PRN

## 2021-11-17 MED ORDER — ALPRAZOLAM 0.25 MG PO TABS
0.2500 mg | ORAL_TABLET | Freq: Once | ORAL | Status: AC | PRN
  Administered 2021-11-17: 11:00:00 0.25 mg via ORAL
  Filled 2021-11-17: qty 1
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
## 2021-11-17 NOTE — Plan of Care (Signed)
  Problem: Clinical Measurements: Goal: Respiratory complications will improve Outcome: Not Progressing Goal: Cardiovascular complication will be avoided Outcome: Not Progressing   
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
## 2021-11-17 NOTE — Progress Notes (Signed)
Pt's HR sustaining 140-150. Pt is anxious, given 0.25 xanax. Notified on call cardiology.

## 2021-11-17 NOTE — Progress Notes (Signed)
Sent another page to cardiology concerning pt's HR. Waiting for call back
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
## 2021-11-17 NOTE — Procedures (Signed)
PROCEDURE SUMMARY:  Successful image-guided right thoracentesis. Yielded 1.0 liters of bloody fluid. Patient tolerated procedure well. EBL <  1 mL No immediate complications.  Specimen was sent for labs. Post procedure CXR shows no pneumothorax.  Please see imaging section of Epic for full dictation.  Villa Herb PA-C 11/17/2021 11:41 AM

## 2021-11-17 NOTE — Progress Notes (Signed)
Patient returned from thoracentesis. Vitals signs taken and stable. Site assessed and no bleeding or drainage. Placed back on tele. Son at bedside and call bell within reach.  [place] N Satonya Lux

## 2021-11-18 DIAGNOSIS — I4892 Unspecified atrial flutter: Secondary | ICD-10-CM | POA: Diagnosis not present

## 2021-11-18 LAB — CBC
HCT: 36.4 % (ref 36.0–46.0)
Hemoglobin: 12.1 g/dL (ref 12.0–15.0)
MCH: 31.3 pg (ref 26.0–34.0)
MCHC: 33.2 g/dL (ref 30.0–36.0)
MCV: 94.1 fL (ref 80.0–100.0)
Platelets: 405 10*3/uL — ABNORMAL HIGH (ref 150–400)
RBC: 3.87 MIL/uL (ref 3.87–5.11)
RDW: 13.8 % (ref 11.5–15.5)
WBC: 11.1 10*3/uL — ABNORMAL HIGH (ref 4.0–10.5)
nRBC: 0 % (ref 0.0–0.2)

## 2021-11-18 LAB — BASIC METABOLIC PANEL
Anion gap: 8 (ref 5–15)
BUN: 11 mg/dL (ref 8–23)
CO2: 34 mmol/L — ABNORMAL HIGH (ref 22–32)
Calcium: 8.4 mg/dL — ABNORMAL LOW (ref 8.9–10.3)
Chloride: 88 mmol/L — ABNORMAL LOW (ref 98–111)
Creatinine, Ser: 0.92 mg/dL (ref 0.44–1.00)
GFR, Estimated: 60 mL/min — ABNORMAL LOW (ref >60)
Glucose, Bld: 142 mg/dL — ABNORMAL HIGH (ref 70–99)
Potassium: 4 mmol/L (ref 3.5–5.1)
Sodium: 130 mmol/L — ABNORMAL LOW (ref 135–145)

## 2021-11-18 LAB — MAGNESIUM: Magnesium: 1.7 mg/dL (ref 1.7–2.4)

## 2021-11-18 MED ORDER — FUROSEMIDE 40 MG PO TABS
40.0000 mg | ORAL_TABLET | Freq: Every day | ORAL | Status: DC
  Administered 2021-11-18 – 2021-11-26 (×9): 40 mg via ORAL
  Filled 2021-11-18 (×9): qty 1
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
## 2021-11-19 DIAGNOSIS — I4892 Unspecified atrial flutter: Secondary | ICD-10-CM | POA: Diagnosis not present

## 2021-11-19 LAB — BASIC METABOLIC PANEL
Anion gap: 6 (ref 5–15)
BUN: 11 mg/dL (ref 8–23)
CO2: 36 mmol/L — ABNORMAL HIGH (ref 22–32)
Calcium: 8.5 mg/dL — ABNORMAL LOW (ref 8.9–10.3)
Chloride: 91 mmol/L — ABNORMAL LOW (ref 98–111)
Creatinine, Ser: 1.01 mg/dL — ABNORMAL HIGH (ref 0.44–1.00)
GFR, Estimated: 53 mL/min — ABNORMAL LOW (ref >60)
Glucose, Bld: 139 mg/dL — ABNORMAL HIGH (ref 70–99)
Potassium: 3.7 mmol/L (ref 3.5–5.1)
Sodium: 133 mmol/L — ABNORMAL LOW (ref 135–145)

## 2021-11-19 LAB — CBC
HCT: 33 % — ABNORMAL LOW (ref 36.0–46.0)
Hemoglobin: 11.2 g/dL — ABNORMAL LOW (ref 12.0–15.0)
MCH: 31.4 pg (ref 26.0–34.0)
MCHC: 33.9 g/dL (ref 30.0–36.0)
MCV: 92.4 fL (ref 80.0–100.0)
Platelets: 420 10*3/uL — ABNORMAL HIGH (ref 150–400)
RBC: 3.57 MIL/uL — ABNORMAL LOW (ref 3.87–5.11)
RDW: 13.9 % (ref 11.5–15.5)
WBC: 9.6 10*3/uL (ref 4.0–10.5)
nRBC: 0 % (ref 0.0–0.2)

## 2021-11-19 MED ORDER — HYALURONIDASE HUMAN 150 UNIT/ML IJ SOLN
150.0000 [IU] | Freq: Once | INTRAMUSCULAR | Status: AC
  Administered 2021-11-19: 21:00:00 150 [IU] via SUBCUTANEOUS
  Filled 2021-11-19: qty 1

## 2021-11-19 MED ORDER — AMIODARONE HCL 200 MG PO TABS
400.0000 mg | ORAL_TABLET | Freq: Every day | ORAL | Status: DC
  Administered 2021-11-19 – 2021-11-20 (×2): 400 mg via ORAL
  Filled 2021-11-19 (×2): qty 2

## 2021-11-19 NOTE — Progress Notes (Signed)
Pt. Right FA IV infiltrated with Amio. Trama MD was called and asked for RN contact cardiology. Cardiology called new order placed Hyaluronidase human Sub placed and instructions given to charge Rn. Will update night RN   Everlean Cherry, RN

## 2021-11-19 NOTE — TOC Progression Note (Addendum)
Transition of Care [facility]) - Progression Note    Patient Details  Name: Cheryl Peterson MRN: [id number] Date of Birth: Feb 26, 1930  Transition of Care [facility] CM/SW Contact  Astrid Drafts Berna Spare, RN Phone Number: 11/19/2021, 3:35 PM  Clinical Narrative:    Spoke with son, Ed, per his request: He states he is considering taking patient back to [place] with him, instead of having her return to a nursing facility.  I did express to him that medical clearance from MD to travel by plane or by car would need to be considered prior to any arrangements being made.  He states that he "is in no hurry to discuss where she is going."  He stated that he wants her to get better before she leaves this time, as he feels she was not stable when she left before.  Left message with French Ana at [facility] to see if patient will be able to return to facility, though doubt that son will agree to this.  Trauma CM/TOC to follow patient progress/assist with dc planning.    Expected Discharge Plan: Skilled Nursing Facility Barriers to Discharge: Continued Medical Work up  Expected Discharge Plan and Services Expected Discharge Plan: Skilled Nursing Facility   Discharge Planning Services: CM Consult Post Acute Care Choice: Skilled Nursing Facility Living arrangements for the past 2 months: Independent Living Facility                                       Social Determinants of Health (SDOH) Interventions    Readmission Risk Interventions No flowsheet data found.  Quintella Baton, RN, BSN  Trauma/Neuro ICU Case Manager [phone]
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
## 2021-11-19 NOTE — Care Management Important Message (Signed)
Important Message  Patient Details  Name: Cheryl Peterson MRN: [id number] Date of Birth: 1930/09/18   Medicare Important Message Given:  Yes     Renie Ora 11/19/2021, 8:37 AM
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
## 2021-11-20 ENCOUNTER — Other Ambulatory Visit: Payer: Self-pay

## 2021-11-20 DIAGNOSIS — I480 Paroxysmal atrial fibrillation: Secondary | ICD-10-CM | POA: Diagnosis not present

## 2021-11-20 DIAGNOSIS — J90 Pleural effusion, not elsewhere classified: Principal | ICD-10-CM

## 2021-11-20 MED ORDER — METOPROLOL TARTRATE 25 MG PO TABS
25.0000 mg | ORAL_TABLET | Freq: Two times a day (BID) | ORAL | Status: DC
  Administered 2021-11-20 – 2021-11-26 (×13): 25 mg via ORAL
  Filled 2021-11-20 (×13): qty 1

## 2021-11-20 MED ORDER — AMIODARONE HCL 200 MG PO TABS
200.0000 mg | ORAL_TABLET | Freq: Two times a day (BID) | ORAL | Status: DC
  Administered 2021-11-20 – 2021-11-26 (×12): 200 mg via ORAL
  Filled 2021-11-20 (×12): qty 1

## 2021-11-20 NOTE — Progress Notes (Signed)
Pt off O2 This evening, saturating well and consistently above 91  Pt previous IV site on right AC got infiltrated yesterday with IV Amiodarone. Pt received SQ Hyaluronidase last night by night shift RN  Pt site was still red and painful today upon palpitation  Pt refused pain meds but very upset over the pain  MD paged  Will continue to monitor
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
## 2021-11-20 NOTE — Progress Notes (Signed)
Hyaluronidase SQ was given per Tobi Bastos, pharmacist's instructions on right forearm. Cool compress applied with pillow supported under elbow. Pt tolerated well. Vital sings remain stable, afebrile. On 4 LPM of O2 NCL, SPO2 99%. Auscultated right lung has coarse crackles, left lung is clear. Pt has tachycardia  and SOB with exertion. EKG is sinus rhythm on monitor with frequent PAC. HR 90s. BP stable. We will continue to monitor.  Filiberto Pinks, RN

## 2021-11-21 ENCOUNTER — Inpatient Hospital Stay (HOSPITAL_COMMUNITY): Payer: Medicare PPO

## 2021-11-21 DIAGNOSIS — J90 Pleural effusion, not elsewhere classified: Secondary | ICD-10-CM | POA: Diagnosis not present

## 2021-11-21 DIAGNOSIS — I480 Paroxysmal atrial fibrillation: Secondary | ICD-10-CM | POA: Diagnosis not present

## 2021-11-21 LAB — BASIC METABOLIC PANEL
Anion gap: 9 (ref 5–15)
BUN: 18 mg/dL (ref 8–23)
CO2: 34 mmol/L — ABNORMAL HIGH (ref 22–32)
Calcium: 8.4 mg/dL — ABNORMAL LOW (ref 8.9–10.3)
Chloride: 90 mmol/L — ABNORMAL LOW (ref 98–111)
Creatinine, Ser: 1.08 mg/dL — ABNORMAL HIGH (ref 0.44–1.00)
GFR, Estimated: 49 mL/min — ABNORMAL LOW (ref >60)
Glucose, Bld: 153 mg/dL — ABNORMAL HIGH (ref 70–99)
Potassium: 3.3 mmol/L — ABNORMAL LOW (ref 3.5–5.1)
Sodium: 133 mmol/L — ABNORMAL LOW (ref 135–145)

## 2021-11-21 LAB — CULTURE, BLOOD (ROUTINE X 2)
Culture: NO GROWTH
Culture: NO GROWTH
Special Requests: ADEQUATE
Special Requests: ADEQUATE

## 2021-11-21 LAB — CBC
HCT: 30.8 % — ABNORMAL LOW (ref 36.0–46.0)
Hemoglobin: 10.2 g/dL — ABNORMAL LOW (ref 12.0–15.0)
MCH: 31.1 pg (ref 26.0–34.0)
MCHC: 33.1 g/dL (ref 30.0–36.0)
MCV: 93.9 fL (ref 80.0–100.0)
Platelets: 318 10*3/uL (ref 150–400)
RBC: 3.28 MIL/uL — ABNORMAL LOW (ref 3.87–5.11)
RDW: 13.7 % (ref 11.5–15.5)
WBC: 10.1 10*3/uL (ref 4.0–10.5)
nRBC: 0 % (ref 0.0–0.2)

## 2021-11-21 LAB — BODY FLUID CULTURE W GRAM STAIN: Culture: NO GROWTH

## 2021-11-21 MED ORDER — POTASSIUM CHLORIDE 20 MEQ PO PACK
60.0000 meq | PACK | Freq: Once | ORAL | Status: AC
  Administered 2021-11-21: 13:00:00 60 meq via ORAL
  Filled 2021-11-21: qty 3
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
## 2021-11-21 NOTE — TOC Progression Note (Signed)
Transition of Care [facility]) - Progression Note    Patient Details  Name: Cheryl Peterson MRN: [id number] Date of Birth: 15-Jan-1930  Transition of Care [facility]) CM/SW Contact  Patrice [place], [place] Phone Number: [phone] 11/21/2021, 11:54 AM  Clinical Narrative:     Pt only has one bed offers, will follow for more bed offers as they come available.  TOC team will continue to assist with discharge planning needs.   Expected Discharge Plan: Skilled Nursing Facility Barriers to Discharge: Continued Medical Work up  Expected Discharge Plan and Services Expected Discharge Plan: Skilled Nursing Facility   Discharge Planning Services: CM Consult Post Acute Care Choice: Skilled Nursing Facility Living arrangements for the past 2 months: Independent Living Facility                                       Social Determinants of Health (SDOH) Interventions    Readmission Risk Interventions No flowsheet data found.
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
## 2021-11-22 ENCOUNTER — Inpatient Hospital Stay (HOSPITAL_COMMUNITY): Payer: Medicare PPO

## 2021-11-22 DIAGNOSIS — J90 Pleural effusion, not elsewhere classified: Secondary | ICD-10-CM | POA: Diagnosis not present

## 2021-11-22 DIAGNOSIS — Z79899 Other long term (current) drug therapy: Secondary | ICD-10-CM

## 2021-11-22 DIAGNOSIS — D649 Anemia, unspecified: Secondary | ICD-10-CM | POA: Diagnosis not present

## 2021-11-22 DIAGNOSIS — M79601 Pain in right arm: Secondary | ICD-10-CM

## 2021-11-22 DIAGNOSIS — Z5181 Encounter for therapeutic drug level monitoring: Secondary | ICD-10-CM

## 2021-11-22 DIAGNOSIS — I480 Paroxysmal atrial fibrillation: Secondary | ICD-10-CM | POA: Diagnosis not present

## 2021-11-22 LAB — CBC
HCT: 34.6 % — ABNORMAL LOW (ref 36.0–46.0)
Hemoglobin: 11 g/dL — ABNORMAL LOW (ref 12.0–15.0)
MCH: 30.7 pg (ref 26.0–34.0)
MCHC: 31.8 g/dL (ref 30.0–36.0)
MCV: 96.6 fL (ref 80.0–100.0)
Platelets: 330 10*3/uL (ref 150–400)
RBC: 3.58 MIL/uL — ABNORMAL LOW (ref 3.87–5.11)
RDW: 13.7 % (ref 11.5–15.5)
WBC: 8.3 10*3/uL (ref 4.0–10.5)
nRBC: 0 % (ref 0.0–0.2)

## 2021-11-22 LAB — BASIC METABOLIC PANEL
Anion gap: 13 (ref 5–15)
BUN: 15 mg/dL (ref 8–23)
CO2: 28 mmol/L (ref 22–32)
Calcium: 8.6 mg/dL — ABNORMAL LOW (ref 8.9–10.3)
Chloride: 95 mmol/L — ABNORMAL LOW (ref 98–111)
Creatinine, Ser: 0.94 mg/dL (ref 0.44–1.00)
GFR, Estimated: 58 mL/min — ABNORMAL LOW (ref >60)
Glucose, Bld: 130 mg/dL — ABNORMAL HIGH (ref 70–99)
Potassium: 4.4 mmol/L (ref 3.5–5.1)
Sodium: 136 mmol/L (ref 135–145)
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
## 2021-11-22 NOTE — Progress Notes (Signed)
Upper extremity venous has been completed.   Preliminary results in CV Proc.   Results given to [place], Charity fundraiser.   Cheryl Peterson 11/22/2021 11:45 AM

## 2021-11-22 NOTE — Care Management Important Message (Signed)
Important Message  Patient Details  Name: Cheryl Peterson MRN: [id number] Date of Birth: 05/02/1930   Medicare Important Message Given:  Yes     Renie Ora 11/22/2021, 10:27 AM
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
## 2021-11-22 NOTE — TOC Progression Note (Signed)
Transition of Care [facility]) - Progression Note    Patient Details  Name: Cheryl Peterson MRN: [id number] Date of Birth: 24-Dec-1929  Transition of Care [facility]) CM/SW Contact  Verna Czech [place], [place] Phone Number: [phone] 11/22/2021, 1:30 PM  Clinical Narrative:    Follow up call to patient's son ED who confirmed plan to now have patient return to Independent Living at [facility]. He will arranged for private duty aids to come in and assist patient with her daily care needs. They also have PT and OT available if needed.  Bed offers discussed, patient's son has declined skilled nursing care at this time.   Transition of Care to continue to follow for discharge needs.  [address], LCSW Transition of Care [phone]    Expected Discharge Plan: Skilled Nursing Facility Barriers to Discharge: Continued Medical Work up  Expected Discharge Plan and Services Expected Discharge Plan: Skilled Nursing Facility   Discharge Planning Services: CM Consult Post Acute Care Choice: Skilled Nursing Facility Living arrangements for the past 2 months: Independent Living Facility                                       Social Determinants of Health (SDOH) Interventions    Readmission Risk Interventions No flowsheet data found.
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
## 2021-11-23 ENCOUNTER — Inpatient Hospital Stay (HOSPITAL_COMMUNITY): Payer: Medicare PPO

## 2021-11-23 DIAGNOSIS — I480 Paroxysmal atrial fibrillation: Secondary | ICD-10-CM | POA: Diagnosis not present

## 2021-11-23 DIAGNOSIS — J90 Pleural effusion, not elsewhere classified: Secondary | ICD-10-CM | POA: Diagnosis not present

## 2021-11-23 DIAGNOSIS — Z79899 Other long term (current) drug therapy: Secondary | ICD-10-CM | POA: Diagnosis not present

## 2021-11-23 HISTORY — PX: IR THORACENTESIS RIGHT ASP PLEURAL SPACE W/IMG GUIDE: IMG5380

## 2021-11-23 LAB — CBC
HCT: 32.4 % — ABNORMAL LOW (ref 36.0–46.0)
Hemoglobin: 10.3 g/dL — ABNORMAL LOW (ref 12.0–15.0)
MCH: 30.1 pg (ref 26.0–34.0)
MCHC: 31.8 g/dL (ref 30.0–36.0)
MCV: 94.7 fL (ref 80.0–100.0)
Platelets: 317 10*3/uL (ref 150–400)
RBC: 3.42 MIL/uL — ABNORMAL LOW (ref 3.87–5.11)
RDW: 13.4 % (ref 11.5–15.5)
WBC: 6.5 10*3/uL (ref 4.0–10.5)
nRBC: 0 % (ref 0.0–0.2)

## 2021-11-23 LAB — HEPATIC FUNCTION PANEL
ALT: 18 U/L (ref 0–44)
AST: 24 U/L (ref 15–41)
Albumin: 2.6 g/dL — ABNORMAL LOW (ref 3.5–5.0)
Alkaline Phosphatase: 95 U/L (ref 38–126)
Bilirubin, Direct: 0.1 mg/dL (ref 0.0–0.2)
Indirect Bilirubin: 0.6 mg/dL (ref 0.3–0.9)
Total Bilirubin: 0.7 mg/dL (ref 0.3–1.2)
Total Protein: 5.8 g/dL — ABNORMAL LOW (ref 6.5–8.1)

## 2021-11-23 LAB — TSH: TSH: 7.811 u[IU]/mL — ABNORMAL HIGH (ref 0.350–4.500)

## 2021-11-23 MED ORDER — IBUPROFEN 400 MG PO TABS
400.0000 mg | ORAL_TABLET | Freq: Four times a day (QID) | ORAL | Status: DC | PRN
  Administered 2021-11-23: 20:00:00 400 mg via ORAL
  Filled 2021-11-23: qty 1

## 2021-11-23 MED ORDER — LIDOCAINE HCL 1 % IJ SOLN
INTRAMUSCULAR | Status: AC
  Administered 2021-11-23: 13:00:00 10 mL
  Filled 2021-11-23: qty 20
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
## 2021-11-23 NOTE — Progress Notes (Signed)
Patient arrived back to the unit from thoracentesis. Vitals signs taken and stable. RN assessed puncture site. No redness, swelling, or drainage. Will continue to monitor.  [place] N Hadar Elgersma

## 2021-11-24 ENCOUNTER — Inpatient Hospital Stay (HOSPITAL_COMMUNITY): Payer: Medicare PPO

## 2021-11-24 LAB — CBC
HCT: 29.4 % — ABNORMAL LOW (ref 36.0–46.0)
Hemoglobin: 9.3 g/dL — ABNORMAL LOW (ref 12.0–15.0)
MCH: 30.6 pg (ref 26.0–34.0)
MCHC: 31.6 g/dL (ref 30.0–36.0)
MCV: 96.7 fL (ref 80.0–100.0)
Platelets: 294 10*3/uL (ref 150–400)
RBC: 3.04 MIL/uL — ABNORMAL LOW (ref 3.87–5.11)
RDW: 13.6 % (ref 11.5–15.5)
WBC: 7.2 10*3/uL (ref 4.0–10.5)
nRBC: 0 % (ref 0.0–0.2)

## 2021-11-24 LAB — BASIC METABOLIC PANEL
Anion gap: 6 (ref 5–15)
BUN: 16 mg/dL (ref 8–23)
CO2: 35 mmol/L — ABNORMAL HIGH (ref 22–32)
Calcium: 8.3 mg/dL — ABNORMAL LOW (ref 8.9–10.3)
Chloride: 94 mmol/L — ABNORMAL LOW (ref 98–111)
Creatinine, Ser: 1.32 mg/dL — ABNORMAL HIGH (ref 0.44–1.00)
GFR, Estimated: 39 mL/min — ABNORMAL LOW (ref >60)
Glucose, Bld: 119 mg/dL — ABNORMAL HIGH (ref 70–99)
Potassium: 3.9 mmol/L (ref 3.5–5.1)
Sodium: 135 mmol/L (ref 135–145)

## 2021-11-24 LAB — TSH: TSH: 4.074 u[IU]/mL (ref 0.350–4.500)

## 2021-11-24 LAB — T4, FREE: Free T4: 1.04 ng/dL (ref 0.61–1.12)
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
## 2021-11-24 NOTE — TOC Progression Note (Addendum)
Transition of Care [facility]) - Progression Note    Patient Details  Name: Cheryl Peterson MRN: [id number] Date of Birth: 06-19-1930  [facility]) CM/SW Contact  Ella Bodo, RN Phone Number: 11/24/2021, 3:18 PM  Clinical Narrative:    Pt s/p thoracentesis yesterday; able to ambulate with OT with no drop in sats.  Met with patient and family, at bedside; patient states she feels better, and wants to go home.  Patient's son states he has talked with [name] and is arranging private duty caregivers to assist with her care at discharge.  Facility has on-site private duty healthcare company that can provide any amount of care giving needed.  Left message for [facility], [facility] agency at [facility], to assist with coordination of medical home health services, ie) PT/OT and possible HHRN.  Family requests hospital bed for home, as they state patient's bed may be difficult for her to get in/out of.  Notified PA for orders.  Referral to [facility] for hospital bed, to be delivered to [address].  Pt/family decline 3 in 1 BSC. Will follow for additional needs as pt progresses.   Expected Discharge Plan: [place] Barriers to Discharge: Continued Medical Work up  Expected Discharge Plan and Services Expected Discharge Plan: [place]   Discharge Planning Services: CM Consult Post Acute Care Choice: [place] arrangements for the past 2 months: [place]                 DME Arranged: Hospital bed   Date DME Agency Contacted: 11/24/21 Time DME Agency Contacted: [id number] Representative spoke with at DME Agency: Freda Munro HH Arranged: PT, OT [facility] Agency: Other - See comment Date [place]: 11/24/21 Time [place]: 1516 Representative spoke with at [place]: Left message for [employer] staff   Social Determinants of Health ([place]) Interventions    Readmission Risk Interventions No  flowsheet data found.  Reinaldo Raddle, RN, BSN  Trauma/Neuro ICU Case Manager [phone]
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
## 2021-11-24 NOTE — Progress Notes (Signed)
Mobility Specialist: Progress Note   11/24/21 1516  Mobility  Activity Ambulated in hall  Level of Assistance Minimal assist, patient does 75% or more  Assistive Device Front wheel walker  Distance Ambulated (ft) 240 ft (120'x2)  Mobility Ambulated with assistance in hallway  Mobility Response Tolerated well  Mobility performed by Mobility specialist  $Mobility charge 1 Mobility   Post-Mobility: 71 HR, 96% SpO2  Pt required minA to stand from the chair and was contact guard throughout ambulation. Pt stopped x1 for short standing break halfway through ambulation d/t fatigue and some SOB. Pt back to bed after walk per request with bed alarm on and call bell at her side. Family present in the room.   [facility] Rieley Khalsa Mobility Specialist Mobility Specialist 4 [place]: [phone] Mobility Specialist 2 [place] and [address] [place]: [phone]

## 2021-11-24 NOTE — Progress Notes (Signed)
°   11/24/21 1300  Oxygen Therapy  SpO2 96 %  O2 Device Room Air  Patient Activity (if Appropriate) Ambulating  Pulse Oximetry Type Continuous   Martie Round, OTR/L Acute Rehabilitation Services Pager: [phone] Office: [phone]

## 2021-11-25 ENCOUNTER — Inpatient Hospital Stay (HOSPITAL_COMMUNITY): Payer: Medicare PPO

## 2021-11-25 LAB — CBC
HCT: 31.1 % — ABNORMAL LOW (ref 36.0–46.0)
Hemoglobin: 10.3 g/dL — ABNORMAL LOW (ref 12.0–15.0)
MCH: 31.1 pg (ref 26.0–34.0)
MCHC: 33.1 g/dL (ref 30.0–36.0)
MCV: 94 fL (ref 80.0–100.0)
Platelets: 305 10*3/uL (ref 150–400)
RBC: 3.31 MIL/uL — ABNORMAL LOW (ref 3.87–5.11)
RDW: 13.2 % (ref 11.5–15.5)
WBC: 5.3 10*3/uL (ref 4.0–10.5)
nRBC: 0 % (ref 0.0–0.2)

## 2021-11-25 LAB — BASIC METABOLIC PANEL
Anion gap: 8 (ref 5–15)
BUN: 16 mg/dL (ref 8–23)
CO2: 32 mmol/L (ref 22–32)
Calcium: 8.9 mg/dL (ref 8.9–10.3)
Chloride: 96 mmol/L — ABNORMAL LOW (ref 98–111)
Creatinine, Ser: 1.06 mg/dL — ABNORMAL HIGH (ref 0.44–1.00)
GFR, Estimated: 50 mL/min — ABNORMAL LOW (ref >60)
Glucose, Bld: 113 mg/dL — ABNORMAL HIGH (ref 70–99)
Potassium: 4 mmol/L (ref 3.5–5.1)
Sodium: 136 mmol/L (ref 135–145)

## 2021-11-25 MED ORDER — APIXABAN 5 MG PO TABS: 5.0000 mg | ORAL_TABLET | Freq: Two times a day (BID) | ORAL | Status: DC

## 2021-11-25 MED ORDER — ONDANSETRON HCL 4 MG PO TABS: 4.0000 mg | ORAL_TABLET | ORAL | Status: DC | PRN
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
## 2021-11-25 NOTE — TOC Progression Note (Addendum)
Transition of Care [facility]) - Progression Note    Patient Details  Name: Cheryl Peterson MRN: [id number] Date of Birth: 02-26-1930  Transition of [facility]) CM/SW Contact  Astrid Drafts Berna Spare, RN Phone Number: 11/25/2021, 9:55 AM  Clinical Narrative:    Spoke with Benjaman Kindler at [facility], on-site [facility] provider for [facility].  Agency can provide HHPT/OT, as recommended.  Faxed HH orders to Benjaman Kindler at [phone].  Will send dc summary when available.   11/25/21 Addendum 1428 Spoke with pt's son, [name]: he states hospital bed to be delivered tomorrow morning.  He states he is working with the facility to arrange 24h caregivers to start care tomorrow.  We discussed potential plan for dc home tomorrow pending arrangements made, and son agreeable.    Expected Discharge Plan: Home w Home Health Services Barriers to Discharge: Continued Medical Work up  Expected Discharge Plan and Services Expected Discharge Plan: Home w Home Health Services   Discharge Planning Services: CM Consult Post Acute Care Choice: Home Health Living arrangements for the past 2 months: Independent Living Facility                 DME Arranged: Hospital bed   Date DME Agency Contacted: 11/24/21 Time DME Agency Contacted: 1515 Representative spoke with at DME Agency: Velna Hatchet HH Arranged: PT, OT [facility] Agency: Other - See comment Date HH Agency Contacted: 11/24/21 Time HH Agency Contacted: 1516 Representative spoke with at [facility] Agency: Left message for [employer] staff   Social Determinants of Health (SDOH) Interventions    Readmission Risk Interventions No flowsheet data found.  Quintella Baton, RN, BSN  Trauma/Neuro ICU Case Manager [phone]

## 2021-11-25 NOTE — Progress Notes (Signed)
Mobility Specialist: Progress Note   11/25/21 1759  Mobility  Activity Ambulated in hall  Level of Assistance Contact guard assist, steadying assist  Assistive Device Front wheel walker  Distance Ambulated (ft) 370 ft  Mobility Ambulated with assistance in hallway  Mobility Response Tolerated well  Mobility performed by Mobility specialist  $Mobility charge 1 Mobility   Pre-Mobility: 72 HR, 92% SpO2 Post-Mobility: 73 HR  Pt contact guard throughout and stopped x1 for brief standing break d/t SOB, otherwise no other c/o. Pt back to bed after walk per request with call bell and phone at her side.  [facility] Phyllip Claw Mobility Specialist Mobility Specialist [address] [place]: [phone] Mobility Specialist [address] [place] and [address] [place]: [phone]
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
## 2021-11-25 NOTE — Consult Note (Signed)
° °  [facility] Inpatient Consult   11/25/2021  EMERSYN WYSS Sep 04, 1930 [id number]  [facility] [[facility]]  Accountable Care Organization [ACO] Patient: Pecola Lawless PPO  Primary Care Provider:  Cleatis Polka., MD, [facility] is listed for the Transition of Care follow up calls and appointments.    Patient screened for less than 30 LLOS noted.   Review of patient's medical record reveals patient is for home [[facility]] with [facility] [[facility]  Plan:  No additional needs assessed  for post hospital  with [facility] care management at this time.  For questions contact:   Charlesetta Shanks, RN BSN CCM [facility]  [phone] business mobile phone Toll free office [phone]  Fax number: [phone] [email or web address]

## 2021-11-26 LAB — BODY FLUID CULTURE W GRAM STAIN: Culture: NO GROWTH

## 2021-11-26 MED ORDER — AMIODARONE HCL 200 MG PO TABS: 200.0000 mg | ORAL_TABLET | Freq: Two times a day (BID) | ORAL | 0 refills | Status: DC

## 2021-11-26 MED ORDER — FUROSEMIDE 40 MG PO TABS: 40.0000 mg | ORAL_TABLET | Freq: Every day | ORAL | 0 refills | Status: DC

## 2021-11-26 NOTE — Progress Notes (Signed)
Patient discharging home with son. Tele removed and CCMD notified. IV removed without complications. Discharge instructions and mediation administration given. All questions answered. Will call volunteers to take patient out.  [place] N Cici Rodriges

## 2021-11-26 NOTE — TOC Transition Note (Signed)
Transition of Care [facility]) - CM/SW Discharge Note   Patient Details  Name: Cheryl Peterson MRN: [id number] Date of Birth: 06/14/1930  Transition of Care [facility]) CM/SW Contact:  Glennon Mac, RN Phone Number: 11/26/2021, 1200  Clinical Narrative:    Pt discharging home today to [facility] with son, Ed.  Family is arranging 24h assistance through facility's private duty care agency.  HHPT/OT to be provided by [facility], and orders have been faxed.  Will fax discharge summary to [facility], per their request. Hospital bed has been delivered to patient's apartment by [facility], per son.     Final next level of care: Home/Self Care Barriers to Discharge: Barriers Resolved   Patient Goals and CMS Choice Patient states their goals for this hospitalization and ongoing recovery are:: to feel better                            Discharge Plan and Services   Discharge Planning Services: CM Consult Post Acute Care Choice: Home Health          DME Arranged: Hospital bed   Date DME Agency Contacted: 11/24/21 Time DME Agency Contacted: 1515 Representative spoke with at DME Agency: Velna Hatchet HH Arranged: PT, OT [facility] Agency: Other - See comment Date HH Agency Contacted: 11/24/21 Time HH Agency Contacted: 1516 Representative spoke with at [facility] Agency: Left message for [employer] staff  Social Determinants of Health (SDOH) Interventions     Readmission Risk Interventions No flowsheet data found.  Quintella Baton, RN, BSN  Trauma/Neuro ICU Case Manager [phone]

## 2021-11-26 NOTE — Progress Notes (Signed)
Mobility Specialist: Progress Note   11/26/21 1218  Mobility  Activity Ambulated in hall  Level of Assistance Modified independent, requires aide device or extra time  Assistive Device Front wheel walker  Distance Ambulated (ft) 370 ft  Mobility Ambulated with assistance in hallway  Mobility Response Tolerated well  Mobility performed by Mobility specialist  Bed Position Chair  $Mobility charge 1 Mobility   Pre-Mobility: 67 HR Post-Mobility: 73 HR  Pt stopped x1 for short standing break halfway throughout ambulation d/t feeling SOB, otherwise no other c/o. Pt to recliner after walk. Family present in the room.   [facility] Aleida Crandell Mobility Specialist Mobility Specialist [address] [place]: [phone] Mobility Specialist 2 [place] and [address] [place]: [phone]

## 2021-11-29 DIAGNOSIS — M6281 Muscle weakness (generalized): Secondary | ICD-10-CM | POA: Diagnosis not present

## 2021-11-29 DIAGNOSIS — R278 Other lack of coordination: Secondary | ICD-10-CM | POA: Diagnosis not present

## 2021-11-30 DIAGNOSIS — R41841 Cognitive communication deficit: Secondary | ICD-10-CM | POA: Diagnosis not present

## 2021-11-30 DIAGNOSIS — R2681 Unsteadiness on feet: Secondary | ICD-10-CM | POA: Diagnosis not present

## 2021-11-30 DIAGNOSIS — M6281 Muscle weakness (generalized): Secondary | ICD-10-CM | POA: Diagnosis not present

## 2021-11-30 DIAGNOSIS — R2689 Other abnormalities of gait and mobility: Secondary | ICD-10-CM | POA: Diagnosis not present

## 2021-11-30 DIAGNOSIS — R488 Other symbolic dysfunctions: Secondary | ICD-10-CM | POA: Diagnosis not present

## 2021-12-01 ENCOUNTER — Encounter (HOSPITAL_BASED_OUTPATIENT_CLINIC_OR_DEPARTMENT_OTHER): Payer: Self-pay

## 2021-12-01 ENCOUNTER — Ambulatory Visit (HOSPITAL_BASED_OUTPATIENT_CLINIC_OR_DEPARTMENT_OTHER): Payer: Medicare PPO | Admitting: Family

## 2021-12-01 ENCOUNTER — Other Ambulatory Visit: Payer: Self-pay

## 2021-12-01 ENCOUNTER — Encounter (HOSPITAL_BASED_OUTPATIENT_CLINIC_OR_DEPARTMENT_OTHER): Payer: Self-pay | Admitting: Family

## 2021-12-01 VITALS — BP 124/60 | HR 67 | Ht 64.0 in | Wt 150.1 lb

## 2021-12-01 DIAGNOSIS — J90 Pleural effusion, not elsewhere classified: Secondary | ICD-10-CM | POA: Diagnosis not present

## 2021-12-01 DIAGNOSIS — Z79899 Other long term (current) drug therapy: Secondary | ICD-10-CM | POA: Diagnosis not present

## 2021-12-01 DIAGNOSIS — I4892 Unspecified atrial flutter: Secondary | ICD-10-CM | POA: Diagnosis not present

## 2021-12-01 MED ORDER — FUROSEMIDE 40 MG PO TABS: 40.0000 mg | ORAL_TABLET | Freq: Every day | ORAL | 2 refills | Status: DC

## 2021-12-01 MED ORDER — AMIODARONE HCL 200 MG PO TABS: ORAL_TABLET | ORAL | 1 refills | Status: DC
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
## 2021-12-01 NOTE — Patient Instructions (Addendum)
Medication Instructions:  Your physician has recommended you make the following change in your medication:   CONTINUE Furosemide (Lasix ) to 40mg  daily *We will consider discontinuing this if on your x-ray your pleural effusion has resolved  On 12/10/21 CHANGE your Amiodarone to one 200mg  tablet daily  *If you need a refill on your cardiac medications before your next appointment, please call your pharmacy*   Lab Work: Your physician recommends that you return for lab work in 1 month for TSH, CMP when you are here for your x-ray. This allows 12/12/21 to monitor your thyroid and liver while on Amiodarone.   Please have this collected at [facility] at [place]. The lab is open 8:00 am - 4:30 pm. Please avoid 12:00p - 1:00p for lunch hour. You do not need an appointment. Please go to [address] [place], [address] [place]. This is in the Primary Care office on the 3rd floor, let them know you are there for blood work and they will direct you to the lab.   Testing/Procedures: A chest x-ray takes a picture of the organs and structures inside the chest, including the heart, lungs, and blood vessels. This test can show several things, including, whether the heart is enlarges; whether fluid is building up in the lungs; and whether pacemaker / defibrillator leads are still in place. Please have a chest x-ray in 1 month at [facility] on the lower level. You may have this done 01/03/22-01/07/22.  Follow-Up: At [facility], you and your health needs are our priority.  As part of our continuing mission to provide you with exceptional heart care, we have created designated Provider Care Teams.  These Care Teams include your primary Cardiologist (physician) and Advanced Practice Providers (APPs -  Physician Assistants and Nurse Practitioners) who all work together to provide you with the care you need, when you need it.  We recommend signing up for the patient portal called  "MyChart".  Sign up information is provided on this After Visit Summary.  MyChart is used to connect with patients for Virtual Visits (Telemedicine).  Patients are able to view lab/test results, encounter notes, upcoming appointments, etc.  Non-urgent messages can be sent to your provider as well.   To learn more about what you can do with MyChart, go to 03/07/22.    Your next appointment:   01/14/22 at 2PM with [email or web address] at [facility]  Other Instructions

## 2021-12-02 DIAGNOSIS — M6281 Muscle weakness (generalized): Secondary | ICD-10-CM | POA: Diagnosis not present

## 2021-12-02 DIAGNOSIS — R278 Other lack of coordination: Secondary | ICD-10-CM | POA: Diagnosis not present

## 2021-12-03 DIAGNOSIS — R2681 Unsteadiness on feet: Secondary | ICD-10-CM | POA: Diagnosis not present

## 2021-12-03 DIAGNOSIS — M6281 Muscle weakness (generalized): Secondary | ICD-10-CM | POA: Diagnosis not present

## 2021-12-03 DIAGNOSIS — R2689 Other abnormalities of gait and mobility: Secondary | ICD-10-CM | POA: Diagnosis not present

## 2021-12-06 DIAGNOSIS — R278 Other lack of coordination: Secondary | ICD-10-CM | POA: Diagnosis not present

## 2021-12-06 DIAGNOSIS — R2689 Other abnormalities of gait and mobility: Secondary | ICD-10-CM | POA: Diagnosis not present

## 2021-12-06 DIAGNOSIS — R2681 Unsteadiness on feet: Secondary | ICD-10-CM | POA: Diagnosis not present

## 2021-12-06 DIAGNOSIS — M6281 Muscle weakness (generalized): Secondary | ICD-10-CM | POA: Diagnosis not present

## 2021-12-07 DIAGNOSIS — R2689 Other abnormalities of gait and mobility: Secondary | ICD-10-CM | POA: Diagnosis not present

## 2021-12-07 DIAGNOSIS — R488 Other symbolic dysfunctions: Secondary | ICD-10-CM | POA: Diagnosis not present

## 2021-12-07 DIAGNOSIS — R41841 Cognitive communication deficit: Secondary | ICD-10-CM | POA: Diagnosis not present

## 2021-12-07 DIAGNOSIS — M6281 Muscle weakness (generalized): Secondary | ICD-10-CM | POA: Diagnosis not present

## 2021-12-07 DIAGNOSIS — R2681 Unsteadiness on feet: Secondary | ICD-10-CM | POA: Diagnosis not present

## 2021-12-08 DIAGNOSIS — M6281 Muscle weakness (generalized): Secondary | ICD-10-CM | POA: Diagnosis not present

## 2021-12-08 DIAGNOSIS — R278 Other lack of coordination: Secondary | ICD-10-CM | POA: Diagnosis not present

## 2021-12-09 DIAGNOSIS — R630 Anorexia: Secondary | ICD-10-CM | POA: Diagnosis not present

## 2021-12-09 DIAGNOSIS — E871 Hypo-osmolality and hyponatremia: Secondary | ICD-10-CM | POA: Diagnosis not present

## 2021-12-09 DIAGNOSIS — D6869 Other thrombophilia: Secondary | ICD-10-CM | POA: Diagnosis not present

## 2021-12-09 DIAGNOSIS — Z20822 Contact with and (suspected) exposure to covid-19: Secondary | ICD-10-CM | POA: Diagnosis not present

## 2021-12-09 DIAGNOSIS — J90 Pleural effusion, not elsewhere classified: Secondary | ICD-10-CM | POA: Diagnosis not present

## 2021-12-09 DIAGNOSIS — I480 Paroxysmal atrial fibrillation: Secondary | ICD-10-CM | POA: Diagnosis not present

## 2021-12-09 DIAGNOSIS — R531 Weakness: Secondary | ICD-10-CM | POA: Diagnosis not present

## 2021-12-10 DIAGNOSIS — M6281 Muscle weakness (generalized): Secondary | ICD-10-CM | POA: Diagnosis not present

## 2021-12-10 DIAGNOSIS — R41841 Cognitive communication deficit: Secondary | ICD-10-CM | POA: Diagnosis not present

## 2021-12-10 DIAGNOSIS — R2681 Unsteadiness on feet: Secondary | ICD-10-CM | POA: Diagnosis not present

## 2021-12-10 DIAGNOSIS — R488 Other symbolic dysfunctions: Secondary | ICD-10-CM | POA: Diagnosis not present

## 2021-12-10 DIAGNOSIS — R2689 Other abnormalities of gait and mobility: Secondary | ICD-10-CM | POA: Diagnosis not present

## 2021-12-13 DIAGNOSIS — R278 Other lack of coordination: Secondary | ICD-10-CM | POA: Diagnosis not present

## 2021-12-13 DIAGNOSIS — M6281 Muscle weakness (generalized): Secondary | ICD-10-CM | POA: Diagnosis not present

## 2021-12-13 DIAGNOSIS — R2689 Other abnormalities of gait and mobility: Secondary | ICD-10-CM | POA: Diagnosis not present

## 2021-12-13 DIAGNOSIS — R2681 Unsteadiness on feet: Secondary | ICD-10-CM | POA: Diagnosis not present

## 2021-12-14 DIAGNOSIS — R2681 Unsteadiness on feet: Secondary | ICD-10-CM | POA: Diagnosis not present

## 2021-12-14 DIAGNOSIS — R278 Other lack of coordination: Secondary | ICD-10-CM | POA: Diagnosis not present

## 2021-12-14 DIAGNOSIS — M6281 Muscle weakness (generalized): Secondary | ICD-10-CM | POA: Diagnosis not present

## 2021-12-14 DIAGNOSIS — R2689 Other abnormalities of gait and mobility: Secondary | ICD-10-CM | POA: Diagnosis not present

## 2021-12-15 ENCOUNTER — Telehealth: Payer: Self-pay | Admitting: Cardiovascular Disease

## 2021-12-15 NOTE — Telephone Encounter (Signed)
Pt c/o medication issue:  1. Name of Medication:  furosemide (LASIX) 40 MG tablet amiodarone (PACERONE) 200 MG tablet  2. How are you currently taking this medication (dosage and times per day)? 1 tablet daily   3. Are you having a reaction (difficulty breathing--STAT)? no  4. What is your medication issue? Patient states she has been having nausea. She says even looking at food makes her nauseas. She says last night she managed a bowl of chicken soup, but it was all she could handle. She says she has been taking alka seltzer when the nausea gets really bad. She says the only think that seems to help is the xanax. She says she also feels very weak. She says she will be gone for lunch from 11:30 -1pm and won't be available. Phone: [phone]

## 2021-12-15 NOTE — Telephone Encounter (Signed)
Duplicate encounter

## 2021-12-15 NOTE — Telephone Encounter (Signed)
Attempted phone call to pt and left voicemail message to contact triage at [phone]. 

## 2021-12-15 NOTE — Telephone Encounter (Signed)
Spoke with pt who complains of nausea and increasing fatigue since starting Amiodarone and Furosemide after seeing Lily Kocher on 12/01/2021.  Pt states she is able to drink water and eat crackers and some cream of chicken soup but has not been able to tolerate a regular diet since 12/07/2021.  She is also concerned because of her increasing fatigue and DOE.  She states she cannot walk to her dining room without having to sit down and rest.  Pt denies additional symptoms at this time of CP, SOB or dizziness.  She would like to know if she should continue current medications that she is contributing to her nausea and fatigue. Pt advised will forward information to Gillian Shields, NP for review and further recommendation.  Reviewed ED precautions.  Pt verbalizes understanding and agrees with current plan.

## 2021-12-15 NOTE — Telephone Encounter (Signed)
Pt returning call to triage... please advise

## 2021-12-16 DIAGNOSIS — R41841 Cognitive communication deficit: Secondary | ICD-10-CM | POA: Diagnosis not present

## 2021-12-16 DIAGNOSIS — R488 Other symbolic dysfunctions: Secondary | ICD-10-CM | POA: Diagnosis not present

## 2021-12-16 DIAGNOSIS — M6281 Muscle weakness (generalized): Secondary | ICD-10-CM | POA: Diagnosis not present

## 2021-12-16 DIAGNOSIS — R2689 Other abnormalities of gait and mobility: Secondary | ICD-10-CM | POA: Diagnosis not present

## 2021-12-16 DIAGNOSIS — R2681 Unsteadiness on feet: Secondary | ICD-10-CM | POA: Diagnosis not present

## 2021-12-16 NOTE — Telephone Encounter (Signed)
Nausea could be side effect of Amiodarone. However, fatigue could be a sign of recurrent atrial fibrillation. Has she noted any palpitations or does she have any recent heart rate readings?  Recommend: Discontinue Amiodarone Start Metoprolol tartrate 25mg  BID to control heart rate as we are stopping Amiodarone Continue Lasix Follow up visit next week with Dr. or APP for EKG.  Elease Hashimoto, NP

## 2021-12-16 NOTE — Telephone Encounter (Signed)
Called to review recommendations by Gillian Shields following pt's call with c/o nausea s/t Amiodarone. No APP appt's open at this time, but earliest would be with N[facility] 1/24 if pt can accomodate. No answer, left message to call office back. Number on file for pt is the son's (ed) number.

## 2021-12-17 NOTE — Telephone Encounter (Signed)
Pts son is returning call.

## 2021-12-17 NOTE — Telephone Encounter (Signed)
Spoke with pt son advised of MD recommendations.  Son is agreeable to plan wrote down instructions.  Pt has a script for metoprolol 25 mg PO BID.  Reinforced to son that pt is to take medication.  Son does not have recent HR pt resides in [facility] facility and they have the ability to check HR.    OV scheduled for 12/28/21 at 1:40 pm son lives in [place] and will call back if he can't find someone to bring pt to appointment.

## 2021-12-17 NOTE — Addendum Note (Signed)
Addended by: Macie Burows on: 12/17/2021 10:32 AM   Modules accepted: Orders

## 2021-12-18 ENCOUNTER — Telehealth: Payer: Self-pay | Admitting: Physician Assistant

## 2021-12-18 DIAGNOSIS — R488 Other symbolic dysfunctions: Secondary | ICD-10-CM | POA: Diagnosis not present

## 2021-12-18 DIAGNOSIS — R41841 Cognitive communication deficit: Secondary | ICD-10-CM | POA: Diagnosis not present

## 2021-12-18 MED ORDER — METOPROLOL TARTRATE 25 MG PO TABS: 25.0000 mg | ORAL_TABLET | Freq: Two times a day (BID) | ORAL | 11 refills | Status: DC
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
## 2021-12-20 DIAGNOSIS — R278 Other lack of coordination: Secondary | ICD-10-CM | POA: Diagnosis not present

## 2021-12-20 DIAGNOSIS — R2681 Unsteadiness on feet: Secondary | ICD-10-CM | POA: Diagnosis not present

## 2021-12-20 DIAGNOSIS — M6281 Muscle weakness (generalized): Secondary | ICD-10-CM | POA: Diagnosis not present

## 2021-12-20 DIAGNOSIS — R2689 Other abnormalities of gait and mobility: Secondary | ICD-10-CM | POA: Diagnosis not present

## 2021-12-21 ENCOUNTER — Ambulatory Visit: Payer: Medicare PPO | Admitting: Physician Assistant

## 2021-12-21 DIAGNOSIS — R2689 Other abnormalities of gait and mobility: Secondary | ICD-10-CM | POA: Diagnosis not present

## 2021-12-21 DIAGNOSIS — R2681 Unsteadiness on feet: Secondary | ICD-10-CM | POA: Diagnosis not present

## 2021-12-21 DIAGNOSIS — M6281 Muscle weakness (generalized): Secondary | ICD-10-CM | POA: Diagnosis not present

## 2021-12-21 DIAGNOSIS — R278 Other lack of coordination: Secondary | ICD-10-CM | POA: Diagnosis not present

## 2021-12-22 DIAGNOSIS — R488 Other symbolic dysfunctions: Secondary | ICD-10-CM | POA: Diagnosis not present

## 2021-12-22 DIAGNOSIS — R2689 Other abnormalities of gait and mobility: Secondary | ICD-10-CM | POA: Diagnosis not present

## 2021-12-22 DIAGNOSIS — M6281 Muscle weakness (generalized): Secondary | ICD-10-CM | POA: Diagnosis not present

## 2021-12-22 DIAGNOSIS — R2681 Unsteadiness on feet: Secondary | ICD-10-CM | POA: Diagnosis not present

## 2021-12-22 DIAGNOSIS — R41841 Cognitive communication deficit: Secondary | ICD-10-CM | POA: Diagnosis not present

## 2021-12-23 ENCOUNTER — Telehealth: Payer: Self-pay | Admitting: Cardiovascular Disease

## 2021-12-23 DIAGNOSIS — R488 Other symbolic dysfunctions: Secondary | ICD-10-CM | POA: Diagnosis not present

## 2021-12-23 DIAGNOSIS — R2681 Unsteadiness on feet: Secondary | ICD-10-CM | POA: Diagnosis not present

## 2021-12-23 DIAGNOSIS — R2689 Other abnormalities of gait and mobility: Secondary | ICD-10-CM | POA: Diagnosis not present

## 2021-12-23 DIAGNOSIS — M6281 Muscle weakness (generalized): Secondary | ICD-10-CM | POA: Diagnosis not present

## 2021-12-23 DIAGNOSIS — R41841 Cognitive communication deficit: Secondary | ICD-10-CM | POA: Diagnosis not present

## 2021-12-23 NOTE — Telephone Encounter (Signed)
° °  Pt's son calling, he would like to ask Dr. Elease Hashimoto if pt needs to be seen this 01/31, he wanted to make sure if its ok for the pt to hold off appt and if pt can be seen on 02/17 instead

## 2021-12-23 NOTE — Telephone Encounter (Signed)
Are you ok with pt waiting to be seen until 01/14/22 if she's not having acute issues?  Looks like she was scheduled to see you 1/31 due to reactions to Amiodarone.  Metoprolol Tartrate 25mg  BID was started at that time.  , PA-C recommended seeing you at next week.  Pt also has known pleural effusion and plan was to repeat in a month which would be right about now.

## 2021-12-24 DIAGNOSIS — R278 Other lack of coordination: Secondary | ICD-10-CM | POA: Diagnosis not present

## 2021-12-24 DIAGNOSIS — M6281 Muscle weakness (generalized): Secondary | ICD-10-CM | POA: Diagnosis not present

## 2021-12-24 NOTE — Telephone Encounter (Signed)
Spoke with pt's son and advised per Dr Elease Hashimoto pt may wait to be seen on 01/14/2022.  12/28/2021 appointment canceled.  Pt's son verbalizes understanding and agrees with current plan.

## 2021-12-27 ENCOUNTER — Telehealth: Payer: Self-pay | Admitting: Cardiovascular Disease

## 2021-12-27 DIAGNOSIS — R278 Other lack of coordination: Secondary | ICD-10-CM | POA: Diagnosis not present

## 2021-12-27 DIAGNOSIS — M6281 Muscle weakness (generalized): Secondary | ICD-10-CM | POA: Diagnosis not present

## 2021-12-27 DIAGNOSIS — R2681 Unsteadiness on feet: Secondary | ICD-10-CM | POA: Diagnosis not present

## 2021-12-27 DIAGNOSIS — R2689 Other abnormalities of gait and mobility: Secondary | ICD-10-CM | POA: Diagnosis not present

## 2021-12-27 NOTE — Telephone Encounter (Signed)
Patient calling to find out if the x ray she needs to have done at [facility] has to be scheduled or if it is a walk in. Phone: [phone]

## 2021-12-27 NOTE — Telephone Encounter (Signed)
Pt advised to have her CXR per Gillian Shields NP at the med center [facility] location.

## 2021-12-28 ENCOUNTER — Ambulatory Visit: Payer: Medicare PPO | Admitting: Cardiovascular Disease

## 2021-12-28 DIAGNOSIS — M6281 Muscle weakness (generalized): Secondary | ICD-10-CM | POA: Diagnosis not present

## 2021-12-28 DIAGNOSIS — R278 Other lack of coordination: Secondary | ICD-10-CM | POA: Diagnosis not present

## 2021-12-28 DIAGNOSIS — R41841 Cognitive communication deficit: Secondary | ICD-10-CM | POA: Diagnosis not present

## 2021-12-28 DIAGNOSIS — R488 Other symbolic dysfunctions: Secondary | ICD-10-CM | POA: Diagnosis not present

## 2021-12-30 DIAGNOSIS — M6281 Muscle weakness (generalized): Secondary | ICD-10-CM | POA: Diagnosis not present

## 2021-12-30 DIAGNOSIS — R41841 Cognitive communication deficit: Secondary | ICD-10-CM | POA: Diagnosis not present

## 2021-12-30 DIAGNOSIS — R488 Other symbolic dysfunctions: Secondary | ICD-10-CM | POA: Diagnosis not present

## 2021-12-30 DIAGNOSIS — R2681 Unsteadiness on feet: Secondary | ICD-10-CM | POA: Diagnosis not present

## 2021-12-30 DIAGNOSIS — R2689 Other abnormalities of gait and mobility: Secondary | ICD-10-CM | POA: Diagnosis not present

## 2022-01-03 ENCOUNTER — Other Ambulatory Visit: Payer: Self-pay

## 2022-01-03 ENCOUNTER — Ambulatory Visit (HOSPITAL_BASED_OUTPATIENT_CLINIC_OR_DEPARTMENT_OTHER)
Admission: RE | Admit: 2022-01-03 | Discharge: 2022-01-03 | Disposition: A | Discharge status: Home / Self Care | Payer: Medicare PPO | Source: Ambulatory Visit | Attending: Family | Admitting: Family

## 2022-01-03 DIAGNOSIS — E039 Hypothyroidism, unspecified: Secondary | ICD-10-CM | POA: Diagnosis not present

## 2022-01-03 DIAGNOSIS — J90 Pleural effusion, not elsewhere classified: Secondary | ICD-10-CM | POA: Insufficient documentation

## 2022-01-03 DIAGNOSIS — Z79899 Other long term (current) drug therapy: Secondary | ICD-10-CM | POA: Diagnosis not present

## 2022-01-03 DIAGNOSIS — I4892 Unspecified atrial flutter: Secondary | ICD-10-CM | POA: Diagnosis not present

## 2022-01-04 DIAGNOSIS — R488 Other symbolic dysfunctions: Secondary | ICD-10-CM | POA: Diagnosis not present

## 2022-01-04 DIAGNOSIS — R41841 Cognitive communication deficit: Secondary | ICD-10-CM | POA: Diagnosis not present

## 2022-01-04 DIAGNOSIS — M6281 Muscle weakness (generalized): Secondary | ICD-10-CM | POA: Diagnosis not present

## 2022-01-04 DIAGNOSIS — R278 Other lack of coordination: Secondary | ICD-10-CM | POA: Diagnosis not present

## 2022-01-04 LAB — COMPREHENSIVE METABOLIC PANEL
ALT: 11 IU/L (ref 0–32)
AST: 19 IU/L (ref 0–40)
Albumin/Globulin Ratio: 1.4 (ref 1.2–2.2)
Albumin: 4.2 g/dL (ref 3.6–4.6)
Alkaline Phosphatase: 101 IU/L (ref 44–121)
BUN/Creatinine Ratio: 19 (ref 12–28)
BUN: 27 mg/dL (ref 8–27)
Bilirubin Total: 0.3 mg/dL (ref 0.0–1.2)
CO2: 21 mmol/L (ref 20–29)
Calcium: 10 mg/dL (ref 8.7–10.3)
Chloride: 98 mmol/L (ref 96–106)
Creatinine, Ser: 1.44 mg/dL — ABNORMAL HIGH (ref 0.57–1.00)
Globulin, Total: 3 g/dL (ref 1.5–4.5)
Glucose: 196 mg/dL — ABNORMAL HIGH (ref 70–99)
Potassium: 4.8 mmol/L (ref 3.5–5.2)
Sodium: 139 mmol/L (ref 134–144)
Total Protein: 7.2 g/dL (ref 6.0–8.5)
eGFR: 35 mL/min/{1.73_m2} — ABNORMAL LOW (ref >59)

## 2022-01-04 LAB — CBC
Hematocrit: 38.1 % (ref 34.0–46.6)
Hemoglobin: 12.6 g/dL (ref 11.1–15.9)
MCH: 30.5 pg (ref 26.6–33.0)
MCHC: 33.1 g/dL (ref 31.5–35.7)
MCV: 92 fL (ref 79–97)
Platelets: 327 10*3/uL (ref 150–450)
RBC: 4.13 x10E6/uL (ref 3.77–5.28)
RDW: 13.6 % (ref 11.7–15.4)
WBC: 8.8 10*3/uL (ref 3.4–10.8)

## 2022-01-04 LAB — TSH: TSH: 14 u[IU]/mL — ABNORMAL HIGH (ref 0.450–4.500)

## 2022-01-05 ENCOUNTER — Telehealth: Payer: Self-pay | Admitting: Family

## 2022-01-05 DIAGNOSIS — R2681 Unsteadiness on feet: Secondary | ICD-10-CM | POA: Diagnosis not present

## 2022-01-05 DIAGNOSIS — R2689 Other abnormalities of gait and mobility: Secondary | ICD-10-CM | POA: Diagnosis not present

## 2022-01-05 DIAGNOSIS — M6281 Muscle weakness (generalized): Secondary | ICD-10-CM | POA: Diagnosis not present

## 2022-01-05 NOTE — Telephone Encounter (Signed)
Patient returning call for lab results. 

## 2022-01-05 NOTE — Telephone Encounter (Signed)
Spoke with patient via phone.   Reviewed CXR and lab results. She verbalized understanding to stop Furosemide. Will call [facility] to make them aware Furosemide was stopped. She was reassured by result. Reiterated need to stay on Metoprolol 25mg  BID.   Loel Dubonnet, NP

## 2022-01-06 DIAGNOSIS — R2681 Unsteadiness on feet: Secondary | ICD-10-CM | POA: Diagnosis not present

## 2022-01-06 DIAGNOSIS — R2689 Other abnormalities of gait and mobility: Secondary | ICD-10-CM | POA: Diagnosis not present

## 2022-01-06 DIAGNOSIS — M6281 Muscle weakness (generalized): Secondary | ICD-10-CM | POA: Diagnosis not present

## 2022-01-06 DIAGNOSIS — R488 Other symbolic dysfunctions: Secondary | ICD-10-CM | POA: Diagnosis not present

## 2022-01-06 DIAGNOSIS — R41841 Cognitive communication deficit: Secondary | ICD-10-CM | POA: Diagnosis not present

## 2022-01-06 LAB — SPECIMEN STATUS REPORT

## 2022-01-06 LAB — T4, FREE: Free T4: 1.53 ng/dL (ref 0.82–1.77)

## 2022-01-06 LAB — T3, FREE: T3, Free: 2.3 pg/mL (ref 2.0–4.4)

## 2022-01-07 DIAGNOSIS — R2689 Other abnormalities of gait and mobility: Secondary | ICD-10-CM | POA: Diagnosis not present

## 2022-01-07 DIAGNOSIS — M6281 Muscle weakness (generalized): Secondary | ICD-10-CM | POA: Diagnosis not present

## 2022-01-07 DIAGNOSIS — R278 Other lack of coordination: Secondary | ICD-10-CM | POA: Diagnosis not present

## 2022-01-07 DIAGNOSIS — R2681 Unsteadiness on feet: Secondary | ICD-10-CM | POA: Diagnosis not present

## 2022-01-10 DIAGNOSIS — R278 Other lack of coordination: Secondary | ICD-10-CM | POA: Diagnosis not present

## 2022-01-10 DIAGNOSIS — R2681 Unsteadiness on feet: Secondary | ICD-10-CM | POA: Diagnosis not present

## 2022-01-10 DIAGNOSIS — R2689 Other abnormalities of gait and mobility: Secondary | ICD-10-CM | POA: Diagnosis not present

## 2022-01-10 DIAGNOSIS — M6281 Muscle weakness (generalized): Secondary | ICD-10-CM | POA: Diagnosis not present

## 2022-01-11 DIAGNOSIS — R278 Other lack of coordination: Secondary | ICD-10-CM | POA: Diagnosis not present

## 2022-01-11 DIAGNOSIS — M6281 Muscle weakness (generalized): Secondary | ICD-10-CM | POA: Diagnosis not present

## 2022-01-12 DIAGNOSIS — R488 Other symbolic dysfunctions: Secondary | ICD-10-CM | POA: Diagnosis not present

## 2022-01-12 DIAGNOSIS — R278 Other lack of coordination: Secondary | ICD-10-CM | POA: Diagnosis not present

## 2022-01-12 DIAGNOSIS — R2689 Other abnormalities of gait and mobility: Secondary | ICD-10-CM | POA: Diagnosis not present

## 2022-01-12 DIAGNOSIS — R2681 Unsteadiness on feet: Secondary | ICD-10-CM | POA: Diagnosis not present

## 2022-01-12 DIAGNOSIS — R41841 Cognitive communication deficit: Secondary | ICD-10-CM | POA: Diagnosis not present

## 2022-01-12 DIAGNOSIS — M6281 Muscle weakness (generalized): Secondary | ICD-10-CM | POA: Diagnosis not present

## 2022-01-13 ENCOUNTER — Encounter: Payer: Self-pay | Admitting: Cardiovascular Disease

## 2022-01-13 DIAGNOSIS — R2689 Other abnormalities of gait and mobility: Secondary | ICD-10-CM | POA: Diagnosis not present

## 2022-01-13 DIAGNOSIS — R2681 Unsteadiness on feet: Secondary | ICD-10-CM | POA: Diagnosis not present

## 2022-01-13 DIAGNOSIS — M6281 Muscle weakness (generalized): Secondary | ICD-10-CM | POA: Diagnosis not present

## 2022-01-13 DIAGNOSIS — R278 Other lack of coordination: Secondary | ICD-10-CM | POA: Diagnosis not present
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
## 2022-01-14 ENCOUNTER — Ambulatory Visit: Payer: Medicare PPO | Admitting: Cardiovascular Disease

## 2022-01-14 ENCOUNTER — Encounter: Payer: Self-pay | Admitting: Cardiovascular Disease

## 2022-01-14 ENCOUNTER — Other Ambulatory Visit: Payer: Self-pay

## 2022-01-14 VITALS — BP 120/60 | HR 60 | Ht 63.0 in | Wt 148.0 lb

## 2022-01-14 DIAGNOSIS — I484 Atypical atrial flutter: Secondary | ICD-10-CM

## 2022-01-14 DIAGNOSIS — J90 Pleural effusion, not elsewhere classified: Secondary | ICD-10-CM | POA: Diagnosis not present

## 2022-01-14 DIAGNOSIS — R278 Other lack of coordination: Secondary | ICD-10-CM | POA: Diagnosis not present

## 2022-01-14 DIAGNOSIS — M6281 Muscle weakness (generalized): Secondary | ICD-10-CM | POA: Diagnosis not present

## 2022-01-14 MED ORDER — METOPROLOL TARTRATE 25 MG PO TABS: 25.0000 mg | ORAL_TABLET | Freq: Two times a day (BID) | ORAL | 3 refills | Status: DC

## 2022-01-14 NOTE — Addendum Note (Signed)
Addended by: Molli Barrows on: 01/14/2022 02:20 PM   Modules accepted: Orders

## 2022-01-14 NOTE — Patient Instructions (Signed)
Medication Instructions:  Your physician recommends that you continue on your current medications as directed. Please refer to the Current Medication list given to you today.  *If you need a refill on your cardiac medications before your next appointment, please call your pharmacy*   Lab Work: NONE If you have labs (blood work) drawn today and your tests are completely normal, you will receive your results only by: MyChart Message (if you have MyChart) OR A paper copy in the mail If you have any lab test that is abnormal or we need to change your treatment, we will call you to review the results.   Testing/Procedures: NONE   Follow-Up: At [facility], you and your health needs are our priority.  As part of our continuing mission to provide you with exceptional heart care, we have created designated Provider Care Teams.  These Care Teams include your primary Cardiologist (physician) and Advanced Practice Providers (APPs -  Physician Assistants and Nurse Practitioners) who all work together to provide you with the care you need, when you need it.  We recommend signing up for the patient portal called "MyChart".  Sign up information is provided on this After Visit Summary.  MyChart is used to connect with patients for Virtual Visits (Telemedicine).  Patients are able to view lab/test results, encounter notes, upcoming appointments, etc.  Non-urgent messages can be sent to your provider as well.   To learn more about what you can do with MyChart, go to [email or web address].    Follow-up as needed.  Provider:   Kristeen Miss, MD

## 2022-01-14 NOTE — Addendum Note (Signed)
Addended by: Franchot Gallo on: 01/14/2022 02:14 PM   Modules accepted: Orders

## 2022-01-15 DIAGNOSIS — R41841 Cognitive communication deficit: Secondary | ICD-10-CM | POA: Diagnosis not present

## 2022-01-15 DIAGNOSIS — R278 Other lack of coordination: Secondary | ICD-10-CM | POA: Diagnosis not present

## 2022-01-15 DIAGNOSIS — M6281 Muscle weakness (generalized): Secondary | ICD-10-CM | POA: Diagnosis not present

## 2022-01-15 DIAGNOSIS — R488 Other symbolic dysfunctions: Secondary | ICD-10-CM | POA: Diagnosis not present

## 2022-01-17 ENCOUNTER — Telehealth: Payer: Self-pay | Admitting: Cardiovascular Disease

## 2022-01-17 DIAGNOSIS — R2689 Other abnormalities of gait and mobility: Secondary | ICD-10-CM | POA: Diagnosis not present

## 2022-01-17 DIAGNOSIS — M6281 Muscle weakness (generalized): Secondary | ICD-10-CM | POA: Diagnosis not present

## 2022-01-17 DIAGNOSIS — R2681 Unsteadiness on feet: Secondary | ICD-10-CM | POA: Diagnosis not present
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
## 2022-01-17 NOTE — Telephone Encounter (Addendum)
Reviewed the past office note with the patient. Asked why she wanted to stop the metoprolol and/or was she having problems with the medication. The patient states she wishes to be off the drugs and states she is very healthy and independent, only wanting to be on medication if she has too. She has not taken the medication since Saturday. She is asking how and what the best way is to stop it if she can.  Advised I would send request to Dr. Elease Hashimoto.   Verbalized understanding and requested we leave voice mail instructions if she can't answer.

## 2022-01-17 NOTE — Telephone Encounter (Signed)
See other phone note

## 2022-01-17 NOTE — Telephone Encounter (Signed)
Patient called and said that she would like to get winged off of or totally off of metoprolol tartrate (LOPRESSOR) 25 MG tablet. Patient also want to ask about Tereso Newcomer and the pharmacy [facility] - [place], [place] - [address] [place]. Please call back to discuss

## 2022-01-17 NOTE — Telephone Encounter (Signed)
Patient was returning phone call. Please call back 

## 2022-01-18 ENCOUNTER — Telehealth: Payer: Self-pay | Admitting: Physician Assistant
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
## 2022-01-18 NOTE — Telephone Encounter (Signed)
New Message:    Patient says she no longer wants to take Metoprolol. She wants you to call and stop this at [facility] and stop this prescription please.  Pt c/o medication issue:  1. Name of Medication: Metoprolol 25 mg  2. How are you currently taking this medication (dosage and times per day)?  2 times a day  3. Are you having a reaction (difficulty breathing--STAT)?   4. What is your medication issue? No longer wants to take it at this time

## 2022-01-19 DIAGNOSIS — M6281 Muscle weakness (generalized): Secondary | ICD-10-CM | POA: Diagnosis not present

## 2022-01-19 DIAGNOSIS — R278 Other lack of coordination: Secondary | ICD-10-CM | POA: Diagnosis not present

## 2022-01-19 DIAGNOSIS — R2689 Other abnormalities of gait and mobility: Secondary | ICD-10-CM | POA: Diagnosis not present

## 2022-01-19 DIAGNOSIS — R2681 Unsteadiness on feet: Secondary | ICD-10-CM | POA: Diagnosis not present

## 2022-01-20 DIAGNOSIS — M6281 Muscle weakness (generalized): Secondary | ICD-10-CM | POA: Diagnosis not present

## 2022-01-20 DIAGNOSIS — R2689 Other abnormalities of gait and mobility: Secondary | ICD-10-CM | POA: Diagnosis not present

## 2022-01-20 DIAGNOSIS — R2681 Unsteadiness on feet: Secondary | ICD-10-CM | POA: Diagnosis not present

## 2022-01-21 DIAGNOSIS — R2681 Unsteadiness on feet: Secondary | ICD-10-CM | POA: Diagnosis not present

## 2022-01-21 DIAGNOSIS — R2689 Other abnormalities of gait and mobility: Secondary | ICD-10-CM | POA: Diagnosis not present

## 2022-01-21 DIAGNOSIS — M6281 Muscle weakness (generalized): Secondary | ICD-10-CM | POA: Diagnosis not present

## 2022-01-25 DIAGNOSIS — M6281 Muscle weakness (generalized): Secondary | ICD-10-CM | POA: Diagnosis not present

## 2022-01-25 DIAGNOSIS — R2681 Unsteadiness on feet: Secondary | ICD-10-CM | POA: Diagnosis not present

## 2022-01-25 DIAGNOSIS — R2689 Other abnormalities of gait and mobility: Secondary | ICD-10-CM | POA: Diagnosis not present

## 2022-01-26 DIAGNOSIS — M6281 Muscle weakness (generalized): Secondary | ICD-10-CM | POA: Diagnosis not present

## 2022-01-26 DIAGNOSIS — R278 Other lack of coordination: Secondary | ICD-10-CM | POA: Diagnosis not present

## 2022-01-27 DIAGNOSIS — R2681 Unsteadiness on feet: Secondary | ICD-10-CM | POA: Diagnosis not present

## 2022-01-27 DIAGNOSIS — M6281 Muscle weakness (generalized): Secondary | ICD-10-CM | POA: Diagnosis not present

## 2022-01-27 DIAGNOSIS — R2689 Other abnormalities of gait and mobility: Secondary | ICD-10-CM | POA: Diagnosis not present

## 2022-02-01 DIAGNOSIS — M6281 Muscle weakness (generalized): Secondary | ICD-10-CM | POA: Diagnosis not present

## 2022-02-01 DIAGNOSIS — R278 Other lack of coordination: Secondary | ICD-10-CM | POA: Diagnosis not present

## 2022-02-02 DIAGNOSIS — R2689 Other abnormalities of gait and mobility: Secondary | ICD-10-CM | POA: Diagnosis not present

## 2022-02-02 DIAGNOSIS — M6281 Muscle weakness (generalized): Secondary | ICD-10-CM | POA: Diagnosis not present

## 2022-02-02 DIAGNOSIS — R2681 Unsteadiness on feet: Secondary | ICD-10-CM | POA: Diagnosis not present

## 2022-02-04 DIAGNOSIS — M6281 Muscle weakness (generalized): Secondary | ICD-10-CM | POA: Diagnosis not present

## 2022-02-04 DIAGNOSIS — R278 Other lack of coordination: Secondary | ICD-10-CM | POA: Diagnosis not present

## 2022-02-10 DIAGNOSIS — R2681 Unsteadiness on feet: Secondary | ICD-10-CM | POA: Diagnosis not present

## 2022-02-10 DIAGNOSIS — R278 Other lack of coordination: Secondary | ICD-10-CM | POA: Diagnosis not present

## 2022-02-10 DIAGNOSIS — M6281 Muscle weakness (generalized): Secondary | ICD-10-CM | POA: Diagnosis not present

## 2022-02-10 DIAGNOSIS — R2689 Other abnormalities of gait and mobility: Secondary | ICD-10-CM | POA: Diagnosis not present

## 2022-02-11 DIAGNOSIS — R2689 Other abnormalities of gait and mobility: Secondary | ICD-10-CM | POA: Diagnosis not present

## 2022-02-11 DIAGNOSIS — R278 Other lack of coordination: Secondary | ICD-10-CM | POA: Diagnosis not present

## 2022-02-11 DIAGNOSIS — R2681 Unsteadiness on feet: Secondary | ICD-10-CM | POA: Diagnosis not present

## 2022-02-11 DIAGNOSIS — M6281 Muscle weakness (generalized): Secondary | ICD-10-CM | POA: Diagnosis not present

## 2022-02-14 DIAGNOSIS — R278 Other lack of coordination: Secondary | ICD-10-CM | POA: Diagnosis not present

## 2022-02-14 DIAGNOSIS — M6281 Muscle weakness (generalized): Secondary | ICD-10-CM | POA: Diagnosis not present

## 2022-02-16 DIAGNOSIS — R2689 Other abnormalities of gait and mobility: Secondary | ICD-10-CM | POA: Diagnosis not present

## 2022-02-16 DIAGNOSIS — R278 Other lack of coordination: Secondary | ICD-10-CM | POA: Diagnosis not present

## 2022-02-16 DIAGNOSIS — R2681 Unsteadiness on feet: Secondary | ICD-10-CM | POA: Diagnosis not present

## 2022-02-16 DIAGNOSIS — M6281 Muscle weakness (generalized): Secondary | ICD-10-CM | POA: Diagnosis not present

## 2022-02-17 DIAGNOSIS — M6281 Muscle weakness (generalized): Secondary | ICD-10-CM | POA: Diagnosis not present

## 2022-02-17 DIAGNOSIS — H43813 Vitreous degeneration, bilateral: Secondary | ICD-10-CM | POA: Diagnosis not present

## 2022-02-17 DIAGNOSIS — R2681 Unsteadiness on feet: Secondary | ICD-10-CM | POA: Diagnosis not present

## 2022-02-17 DIAGNOSIS — H353222 Exudative age-related macular degeneration, left eye, with inactive choroidal neovascularization: Secondary | ICD-10-CM | POA: Diagnosis not present

## 2022-02-17 DIAGNOSIS — H35371 Puckering of macula, right eye: Secondary | ICD-10-CM | POA: Diagnosis not present

## 2022-02-17 DIAGNOSIS — R2689 Other abnormalities of gait and mobility: Secondary | ICD-10-CM | POA: Diagnosis not present

## 2022-02-17 DIAGNOSIS — H353112 Nonexudative age-related macular degeneration, right eye, intermediate dry stage: Secondary | ICD-10-CM | POA: Diagnosis not present

## 2022-02-18 ENCOUNTER — Telehealth: Payer: Self-pay | Admitting: Cardiovascular Disease

## 2022-02-18 DIAGNOSIS — R278 Other lack of coordination: Secondary | ICD-10-CM | POA: Diagnosis not present

## 2022-02-18 DIAGNOSIS — M6281 Muscle weakness (generalized): Secondary | ICD-10-CM | POA: Diagnosis not present
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
## 2022-02-18 NOTE — Telephone Encounter (Signed)
Pt c/o medication issue: ? ?1. Name of Medication: metoprolol  ? ?2. How are you currently taking this medication (dosage and times per day)? Not taking ? ?3. Are you having a reaction (difficulty breathing--STAT)? no ? ?4. What is your medication issue? Patient's son states the patient is continuing to get refills from [facility] on medications she is no longer taking. He says he thinks one of them is metoprolol, but there are others. He would like the pharmacy to be contacted to update her medication, because they are not listening to her. ?

## 2022-02-21 DIAGNOSIS — R2689 Other abnormalities of gait and mobility: Secondary | ICD-10-CM | POA: Diagnosis not present

## 2022-02-21 DIAGNOSIS — R2681 Unsteadiness on feet: Secondary | ICD-10-CM | POA: Diagnosis not present

## 2022-02-21 DIAGNOSIS — M6281 Muscle weakness (generalized): Secondary | ICD-10-CM | POA: Diagnosis not present

## 2022-02-22 DIAGNOSIS — R2689 Other abnormalities of gait and mobility: Secondary | ICD-10-CM | POA: Diagnosis not present

## 2022-02-22 DIAGNOSIS — I739 Peripheral vascular disease, unspecified: Secondary | ICD-10-CM | POA: Diagnosis not present

## 2022-02-22 DIAGNOSIS — Z720 Tobacco use: Secondary | ICD-10-CM | POA: Diagnosis not present

## 2022-02-22 DIAGNOSIS — R2681 Unsteadiness on feet: Secondary | ICD-10-CM | POA: Diagnosis not present

## 2022-02-22 DIAGNOSIS — M6281 Muscle weakness (generalized): Secondary | ICD-10-CM | POA: Diagnosis not present

## 2022-02-23 DIAGNOSIS — R2689 Other abnormalities of gait and mobility: Secondary | ICD-10-CM | POA: Diagnosis not present

## 2022-02-23 DIAGNOSIS — R2681 Unsteadiness on feet: Secondary | ICD-10-CM | POA: Diagnosis not present

## 2022-02-23 DIAGNOSIS — M6281 Muscle weakness (generalized): Secondary | ICD-10-CM | POA: Diagnosis not present

## 2022-02-24 DIAGNOSIS — M6281 Muscle weakness (generalized): Secondary | ICD-10-CM | POA: Diagnosis not present

## 2022-02-24 DIAGNOSIS — R278 Other lack of coordination: Secondary | ICD-10-CM | POA: Diagnosis not present

## 2022-02-24 DIAGNOSIS — R2681 Unsteadiness on feet: Secondary | ICD-10-CM | POA: Diagnosis not present

## 2022-02-24 DIAGNOSIS — R2689 Other abnormalities of gait and mobility: Secondary | ICD-10-CM | POA: Diagnosis not present

## 2022-02-28 DIAGNOSIS — R2681 Unsteadiness on feet: Secondary | ICD-10-CM | POA: Diagnosis not present

## 2022-02-28 DIAGNOSIS — M6281 Muscle weakness (generalized): Secondary | ICD-10-CM | POA: Diagnosis not present

## 2022-02-28 DIAGNOSIS — R2689 Other abnormalities of gait and mobility: Secondary | ICD-10-CM | POA: Diagnosis not present

## 2022-02-28 DIAGNOSIS — R278 Other lack of coordination: Secondary | ICD-10-CM | POA: Diagnosis not present

## 2022-03-01 DIAGNOSIS — R2689 Other abnormalities of gait and mobility: Secondary | ICD-10-CM | POA: Diagnosis not present

## 2022-03-01 DIAGNOSIS — M6281 Muscle weakness (generalized): Secondary | ICD-10-CM | POA: Diagnosis not present

## 2022-03-01 DIAGNOSIS — R2681 Unsteadiness on feet: Secondary | ICD-10-CM | POA: Diagnosis not present

## 2022-03-02 DIAGNOSIS — M6281 Muscle weakness (generalized): Secondary | ICD-10-CM | POA: Diagnosis not present

## 2022-03-02 DIAGNOSIS — R278 Other lack of coordination: Secondary | ICD-10-CM | POA: Diagnosis not present

## 2022-03-03 DIAGNOSIS — M6281 Muscle weakness (generalized): Secondary | ICD-10-CM | POA: Diagnosis not present

## 2022-03-03 DIAGNOSIS — R2681 Unsteadiness on feet: Secondary | ICD-10-CM | POA: Diagnosis not present

## 2022-03-03 DIAGNOSIS — R2689 Other abnormalities of gait and mobility: Secondary | ICD-10-CM | POA: Diagnosis not present

## 2022-03-04 DIAGNOSIS — R2681 Unsteadiness on feet: Secondary | ICD-10-CM | POA: Diagnosis not present

## 2022-03-04 DIAGNOSIS — R278 Other lack of coordination: Secondary | ICD-10-CM | POA: Diagnosis not present

## 2022-03-04 DIAGNOSIS — M6281 Muscle weakness (generalized): Secondary | ICD-10-CM | POA: Diagnosis not present

## 2022-03-04 DIAGNOSIS — R2689 Other abnormalities of gait and mobility: Secondary | ICD-10-CM | POA: Diagnosis not present

## 2022-03-08 DIAGNOSIS — M6281 Muscle weakness (generalized): Secondary | ICD-10-CM | POA: Diagnosis not present

## 2022-03-08 DIAGNOSIS — R2689 Other abnormalities of gait and mobility: Secondary | ICD-10-CM | POA: Diagnosis not present

## 2022-03-08 DIAGNOSIS — R2681 Unsteadiness on feet: Secondary | ICD-10-CM | POA: Diagnosis not present

## 2022-03-10 DIAGNOSIS — M6281 Muscle weakness (generalized): Secondary | ICD-10-CM | POA: Diagnosis not present

## 2022-03-10 DIAGNOSIS — R2681 Unsteadiness on feet: Secondary | ICD-10-CM | POA: Diagnosis not present

## 2022-03-10 DIAGNOSIS — R2689 Other abnormalities of gait and mobility: Secondary | ICD-10-CM | POA: Diagnosis not present

## 2022-03-11 DIAGNOSIS — R2689 Other abnormalities of gait and mobility: Secondary | ICD-10-CM | POA: Diagnosis not present

## 2022-03-11 DIAGNOSIS — R278 Other lack of coordination: Secondary | ICD-10-CM | POA: Diagnosis not present

## 2022-03-11 DIAGNOSIS — R2681 Unsteadiness on feet: Secondary | ICD-10-CM | POA: Diagnosis not present

## 2022-03-11 DIAGNOSIS — M6281 Muscle weakness (generalized): Secondary | ICD-10-CM | POA: Diagnosis not present

## 2022-03-15 DIAGNOSIS — R278 Other lack of coordination: Secondary | ICD-10-CM | POA: Diagnosis not present

## 2022-03-15 DIAGNOSIS — M6281 Muscle weakness (generalized): Secondary | ICD-10-CM | POA: Diagnosis not present

## 2022-03-16 DIAGNOSIS — R278 Other lack of coordination: Secondary | ICD-10-CM | POA: Diagnosis not present

## 2022-03-16 DIAGNOSIS — M6281 Muscle weakness (generalized): Secondary | ICD-10-CM | POA: Diagnosis not present

## 2022-03-17 DIAGNOSIS — R2681 Unsteadiness on feet: Secondary | ICD-10-CM | POA: Diagnosis not present

## 2022-03-17 DIAGNOSIS — R2689 Other abnormalities of gait and mobility: Secondary | ICD-10-CM | POA: Diagnosis not present

## 2022-03-17 DIAGNOSIS — R278 Other lack of coordination: Secondary | ICD-10-CM | POA: Diagnosis not present

## 2022-03-17 DIAGNOSIS — M6281 Muscle weakness (generalized): Secondary | ICD-10-CM | POA: Diagnosis not present

## 2022-03-18 DIAGNOSIS — M6281 Muscle weakness (generalized): Secondary | ICD-10-CM | POA: Diagnosis not present

## 2022-03-18 DIAGNOSIS — R278 Other lack of coordination: Secondary | ICD-10-CM | POA: Diagnosis not present

## 2022-03-18 DIAGNOSIS — R2681 Unsteadiness on feet: Secondary | ICD-10-CM | POA: Diagnosis not present

## 2022-03-18 DIAGNOSIS — R2689 Other abnormalities of gait and mobility: Secondary | ICD-10-CM | POA: Diagnosis not present

## 2022-03-22 DIAGNOSIS — R2689 Other abnormalities of gait and mobility: Secondary | ICD-10-CM | POA: Diagnosis not present

## 2022-03-22 DIAGNOSIS — R2681 Unsteadiness on feet: Secondary | ICD-10-CM | POA: Diagnosis not present

## 2022-03-22 DIAGNOSIS — M6281 Muscle weakness (generalized): Secondary | ICD-10-CM | POA: Diagnosis not present

## 2022-03-23 DIAGNOSIS — R278 Other lack of coordination: Secondary | ICD-10-CM | POA: Diagnosis not present

## 2022-03-23 DIAGNOSIS — M6281 Muscle weakness (generalized): Secondary | ICD-10-CM | POA: Diagnosis not present

## 2022-03-28 DIAGNOSIS — R278 Other lack of coordination: Secondary | ICD-10-CM | POA: Diagnosis not present

## 2022-03-28 DIAGNOSIS — M6281 Muscle weakness (generalized): Secondary | ICD-10-CM | POA: Diagnosis not present

## 2022-03-30 DIAGNOSIS — R2681 Unsteadiness on feet: Secondary | ICD-10-CM | POA: Diagnosis not present

## 2022-03-30 DIAGNOSIS — M6281 Muscle weakness (generalized): Secondary | ICD-10-CM | POA: Diagnosis not present

## 2022-03-30 DIAGNOSIS — R2689 Other abnormalities of gait and mobility: Secondary | ICD-10-CM | POA: Diagnosis not present

## 2022-04-06 DIAGNOSIS — R2689 Other abnormalities of gait and mobility: Secondary | ICD-10-CM | POA: Diagnosis not present

## 2022-04-06 DIAGNOSIS — R2681 Unsteadiness on feet: Secondary | ICD-10-CM | POA: Diagnosis not present

## 2022-04-06 DIAGNOSIS — M6281 Muscle weakness (generalized): Secondary | ICD-10-CM | POA: Diagnosis not present

## 2022-04-11 DIAGNOSIS — M6281 Muscle weakness (generalized): Secondary | ICD-10-CM | POA: Diagnosis not present

## 2022-04-11 DIAGNOSIS — R278 Other lack of coordination: Secondary | ICD-10-CM | POA: Diagnosis not present

## 2022-04-12 DIAGNOSIS — R2689 Other abnormalities of gait and mobility: Secondary | ICD-10-CM | POA: Diagnosis not present

## 2022-04-12 DIAGNOSIS — M6281 Muscle weakness (generalized): Secondary | ICD-10-CM | POA: Diagnosis not present

## 2022-04-12 DIAGNOSIS — R2681 Unsteadiness on feet: Secondary | ICD-10-CM | POA: Diagnosis not present

## 2022-07-13 DIAGNOSIS — H04123 Dry eye syndrome of bilateral lacrimal glands: Secondary | ICD-10-CM | POA: Diagnosis not present

## 2022-07-13 DIAGNOSIS — H35373 Puckering of macula, bilateral: Secondary | ICD-10-CM | POA: Diagnosis not present

## 2022-07-13 DIAGNOSIS — H353132 Nonexudative age-related macular degeneration, bilateral, intermediate dry stage: Secondary | ICD-10-CM | POA: Diagnosis not present

## 2022-07-13 DIAGNOSIS — H53483 Generalized contraction of visual field, bilateral: Secondary | ICD-10-CM | POA: Diagnosis not present

## 2022-07-14 DIAGNOSIS — R7301 Impaired fasting glucose: Secondary | ICD-10-CM | POA: Diagnosis not present

## 2022-07-14 DIAGNOSIS — R7989 Other specified abnormal findings of blood chemistry: Secondary | ICD-10-CM | POA: Diagnosis not present

## 2022-07-14 DIAGNOSIS — I10 Essential (primary) hypertension: Secondary | ICD-10-CM | POA: Diagnosis not present

## 2022-07-14 DIAGNOSIS — E785 Hyperlipidemia, unspecified: Secondary | ICD-10-CM | POA: Diagnosis not present

## 2022-07-21 DIAGNOSIS — R82998 Other abnormal findings in urine: Secondary | ICD-10-CM | POA: Diagnosis not present

## 2022-07-21 DIAGNOSIS — Z1331 Encounter for screening for depression: Secondary | ICD-10-CM | POA: Diagnosis not present

## 2022-07-21 DIAGNOSIS — F3342 Major depressive disorder, recurrent, in full remission: Secondary | ICD-10-CM | POA: Diagnosis not present

## 2022-07-21 DIAGNOSIS — N1831 Chronic kidney disease, stage 3a: Secondary | ICD-10-CM | POA: Diagnosis not present

## 2022-07-21 DIAGNOSIS — R7301 Impaired fasting glucose: Secondary | ICD-10-CM | POA: Diagnosis not present

## 2022-07-21 DIAGNOSIS — E039 Hypothyroidism, unspecified: Secondary | ICD-10-CM | POA: Diagnosis not present

## 2022-07-21 DIAGNOSIS — I129 Hypertensive chronic kidney disease with stage 1 through stage 4 chronic kidney disease, or unspecified chronic kidney disease: Secondary | ICD-10-CM | POA: Diagnosis not present

## 2022-07-21 DIAGNOSIS — Z1339 Encounter for screening examination for other mental health and behavioral disorders: Secondary | ICD-10-CM | POA: Diagnosis not present

## 2022-07-21 DIAGNOSIS — I739 Peripheral vascular disease, unspecified: Secondary | ICD-10-CM | POA: Diagnosis not present

## 2022-07-21 DIAGNOSIS — I6509 Occlusion and stenosis of unspecified vertebral artery: Secondary | ICD-10-CM | POA: Diagnosis not present

## 2022-07-21 DIAGNOSIS — E785 Hyperlipidemia, unspecified: Secondary | ICD-10-CM | POA: Diagnosis not present

## 2022-07-21 DIAGNOSIS — Z0000 Encounter for general adult medical examination without abnormal findings: Secondary | ICD-10-CM | POA: Diagnosis not present

## 2022-08-11 IMAGING — DX DG CHEST 1V PORT
1 series · 1 of 1 positions shown · non-contrast
Comparison: None.

CLINICAL DATA: COVID with tachypnea

EXAM:
PORTABLE CHEST 1 VIEW

[chest]
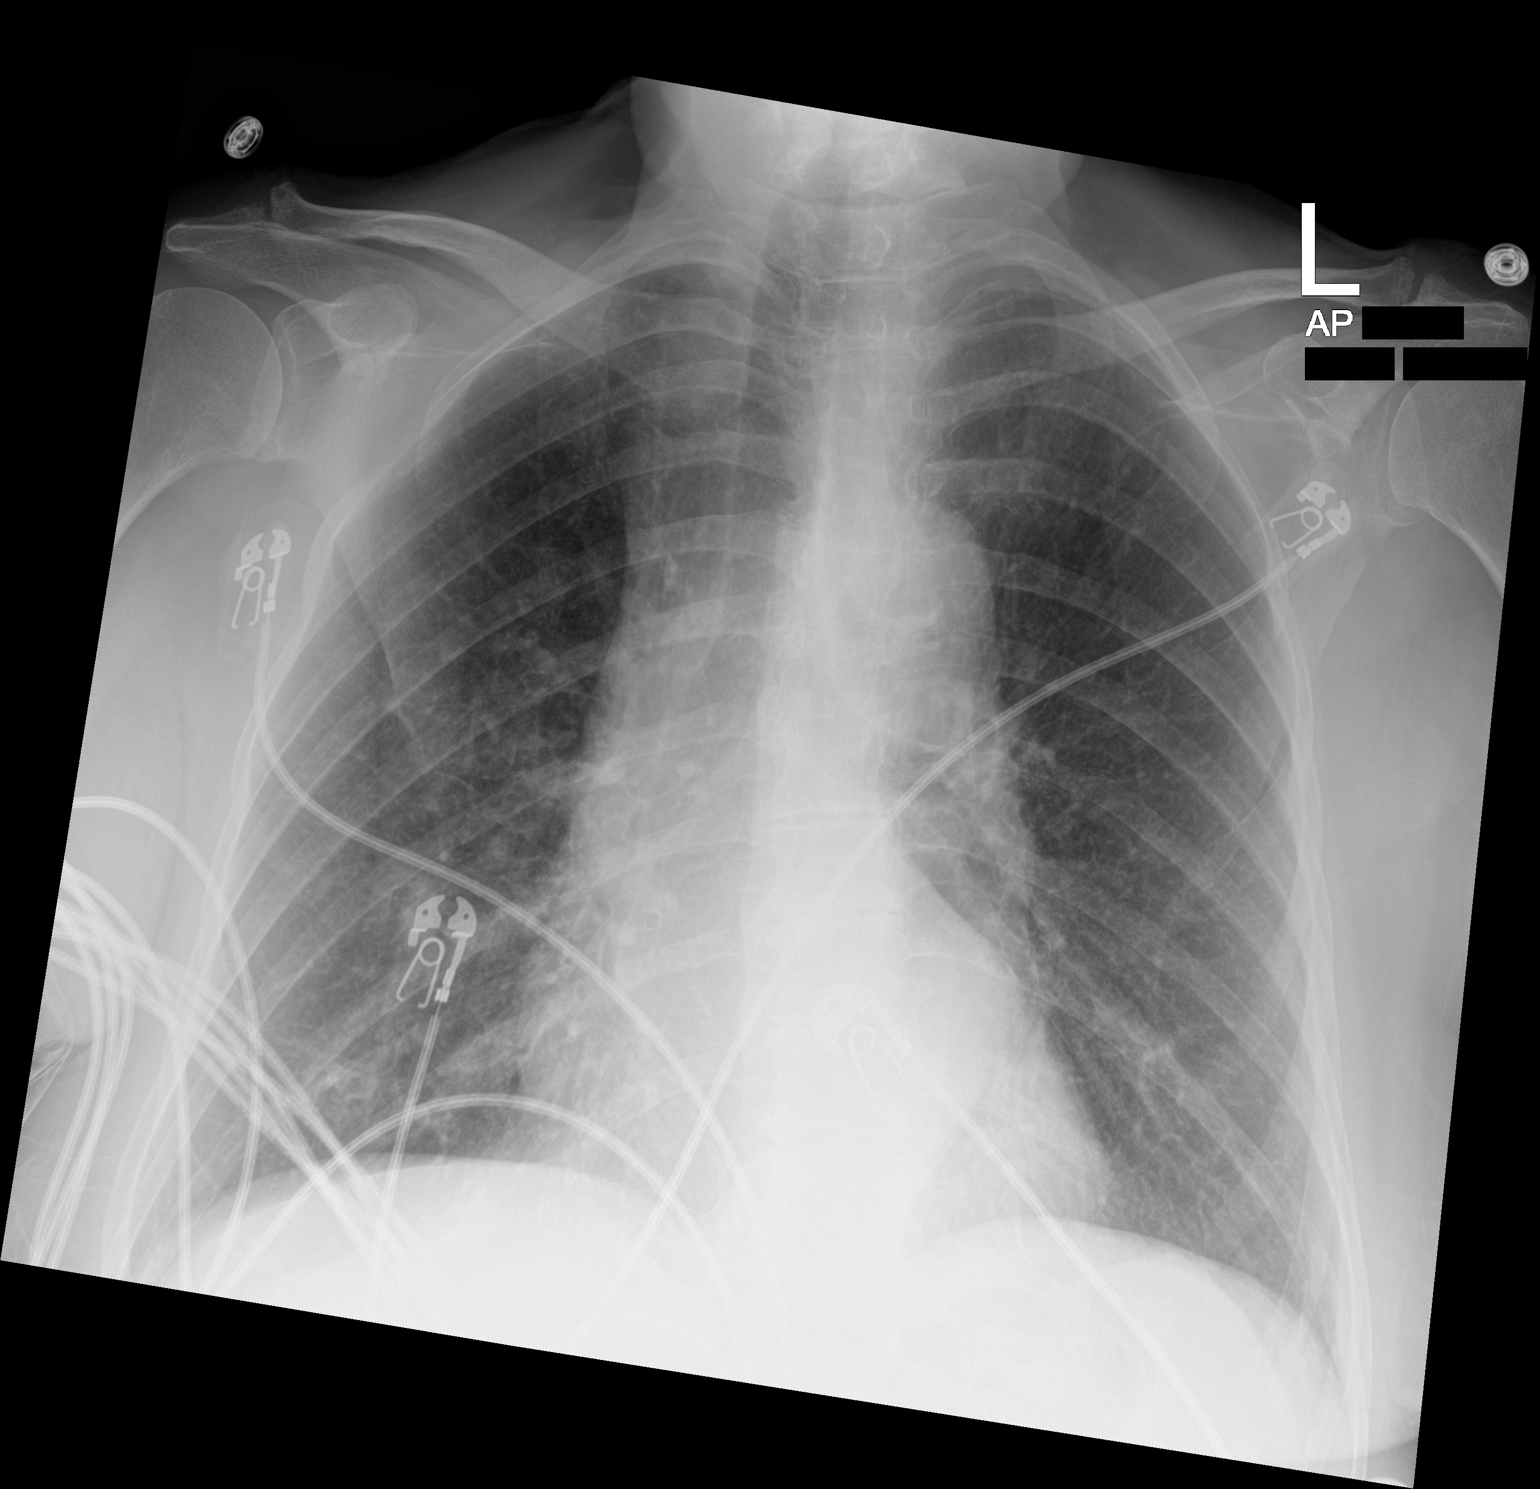

[1 of 1 positions shown; findings below may reference images not displayed]

FINDINGS: The heart size and mediastinal contours are within normal limits.
Aortic atherosclerosis. Both lungs are clear. The visualized
skeletal structures are unremarkable.
IMPRESSION: No active disease.

## 2022-08-19 DIAGNOSIS — D6869 Other thrombophilia: Secondary | ICD-10-CM | POA: Diagnosis not present

## 2022-08-19 DIAGNOSIS — I10 Essential (primary) hypertension: Secondary | ICD-10-CM | POA: Diagnosis not present

## 2022-08-19 DIAGNOSIS — I480 Paroxysmal atrial fibrillation: Secondary | ICD-10-CM | POA: Diagnosis not present

## 2022-08-19 DIAGNOSIS — R519 Headache, unspecified: Secondary | ICD-10-CM | POA: Diagnosis not present

## 2022-08-19 DIAGNOSIS — H8110 Benign paroxysmal vertigo, unspecified ear: Secondary | ICD-10-CM | POA: Diagnosis not present

## 2022-08-19 DIAGNOSIS — E785 Hyperlipidemia, unspecified: Secondary | ICD-10-CM | POA: Diagnosis not present

## 2022-08-19 DIAGNOSIS — I129 Hypertensive chronic kidney disease with stage 1 through stage 4 chronic kidney disease, or unspecified chronic kidney disease: Secondary | ICD-10-CM | POA: Diagnosis not present

## 2022-08-19 DIAGNOSIS — G44221 Chronic tension-type headache, intractable: Secondary | ICD-10-CM | POA: Diagnosis not present

## 2022-08-19 DIAGNOSIS — I6509 Occlusion and stenosis of unspecified vertebral artery: Secondary | ICD-10-CM | POA: Diagnosis not present

## 2022-08-19 DIAGNOSIS — R2681 Unsteadiness on feet: Secondary | ICD-10-CM | POA: Diagnosis not present

## 2022-08-22 DIAGNOSIS — H353222 Exudative age-related macular degeneration, left eye, with inactive choroidal neovascularization: Secondary | ICD-10-CM | POA: Diagnosis not present

## 2022-08-22 DIAGNOSIS — H43813 Vitreous degeneration, bilateral: Secondary | ICD-10-CM | POA: Diagnosis not present

## 2022-08-22 DIAGNOSIS — H353112 Nonexudative age-related macular degeneration, right eye, intermediate dry stage: Secondary | ICD-10-CM | POA: Diagnosis not present

## 2022-08-22 DIAGNOSIS — H35371 Puckering of macula, right eye: Secondary | ICD-10-CM | POA: Diagnosis not present

## 2023-01-25 DIAGNOSIS — N1831 Chronic kidney disease, stage 3a: Secondary | ICD-10-CM | POA: Diagnosis not present

## 2023-01-25 DIAGNOSIS — E785 Hyperlipidemia, unspecified: Secondary | ICD-10-CM | POA: Diagnosis not present

## 2023-01-25 DIAGNOSIS — I739 Peripheral vascular disease, unspecified: Secondary | ICD-10-CM | POA: Diagnosis not present

## 2023-01-25 DIAGNOSIS — Z1339 Encounter for screening examination for other mental health and behavioral disorders: Secondary | ICD-10-CM | POA: Diagnosis not present

## 2023-01-25 DIAGNOSIS — Z1331 Encounter for screening for depression: Secondary | ICD-10-CM | POA: Diagnosis not present

## 2023-01-25 DIAGNOSIS — I129 Hypertensive chronic kidney disease with stage 1 through stage 4 chronic kidney disease, or unspecified chronic kidney disease: Secondary | ICD-10-CM | POA: Diagnosis not present

## 2023-01-25 DIAGNOSIS — R7301 Impaired fasting glucose: Secondary | ICD-10-CM | POA: Diagnosis not present

## 2023-01-25 DIAGNOSIS — E039 Hypothyroidism, unspecified: Secondary | ICD-10-CM | POA: Diagnosis not present

## 2023-03-01 DIAGNOSIS — R0989 Other specified symptoms and signs involving the circulatory and respiratory systems: Secondary | ICD-10-CM | POA: Diagnosis not present

## 2023-03-01 DIAGNOSIS — F419 Anxiety disorder, unspecified: Secondary | ICD-10-CM | POA: Diagnosis not present

## 2023-03-01 DIAGNOSIS — I739 Peripheral vascular disease, unspecified: Secondary | ICD-10-CM | POA: Diagnosis not present

## 2023-03-01 DIAGNOSIS — I10 Essential (primary) hypertension: Secondary | ICD-10-CM | POA: Diagnosis not present

## 2023-03-01 DIAGNOSIS — B351 Tinea unguium: Secondary | ICD-10-CM | POA: Diagnosis not present

## 2023-04-03 DIAGNOSIS — H35371 Puckering of macula, right eye: Secondary | ICD-10-CM | POA: Diagnosis not present

## 2023-04-03 DIAGNOSIS — H353112 Nonexudative age-related macular degeneration, right eye, intermediate dry stage: Secondary | ICD-10-CM | POA: Diagnosis not present

## 2023-04-03 DIAGNOSIS — H35433 Paving stone degeneration of retina, bilateral: Secondary | ICD-10-CM | POA: Diagnosis not present

## 2023-04-03 DIAGNOSIS — H43813 Vitreous degeneration, bilateral: Secondary | ICD-10-CM | POA: Diagnosis not present

## 2023-04-03 DIAGNOSIS — H353222 Exudative age-related macular degeneration, left eye, with inactive choroidal neovascularization: Secondary | ICD-10-CM | POA: Diagnosis not present

## 2023-08-09 DIAGNOSIS — E875 Hyperkalemia: Secondary | ICD-10-CM | POA: Diagnosis not present

## 2023-08-09 DIAGNOSIS — N1831 Chronic kidney disease, stage 3a: Secondary | ICD-10-CM | POA: Diagnosis not present

## 2023-08-09 DIAGNOSIS — R7301 Impaired fasting glucose: Secondary | ICD-10-CM | POA: Diagnosis not present

## 2023-08-09 DIAGNOSIS — E785 Hyperlipidemia, unspecified: Secondary | ICD-10-CM | POA: Diagnosis not present

## 2023-08-09 DIAGNOSIS — I129 Hypertensive chronic kidney disease with stage 1 through stage 4 chronic kidney disease, or unspecified chronic kidney disease: Secondary | ICD-10-CM | POA: Diagnosis not present

## 2023-08-09 DIAGNOSIS — E039 Hypothyroidism, unspecified: Secondary | ICD-10-CM | POA: Diagnosis not present

## 2023-08-11 DIAGNOSIS — H524 Presbyopia: Secondary | ICD-10-CM | POA: Diagnosis not present

## 2023-08-11 DIAGNOSIS — Z961 Presence of intraocular lens: Secondary | ICD-10-CM | POA: Diagnosis not present

## 2023-08-11 IMAGING — DX DG CHEST 1V PORT
1 series · 1 of 1 positions shown · non-contrast
Comparison: Chest radiograph from one day prior.

CLINICAL DATA: Dyspnea, pleural effusion

EXAM:
PORTABLE CHEST 1 VIEW

[chest ap]
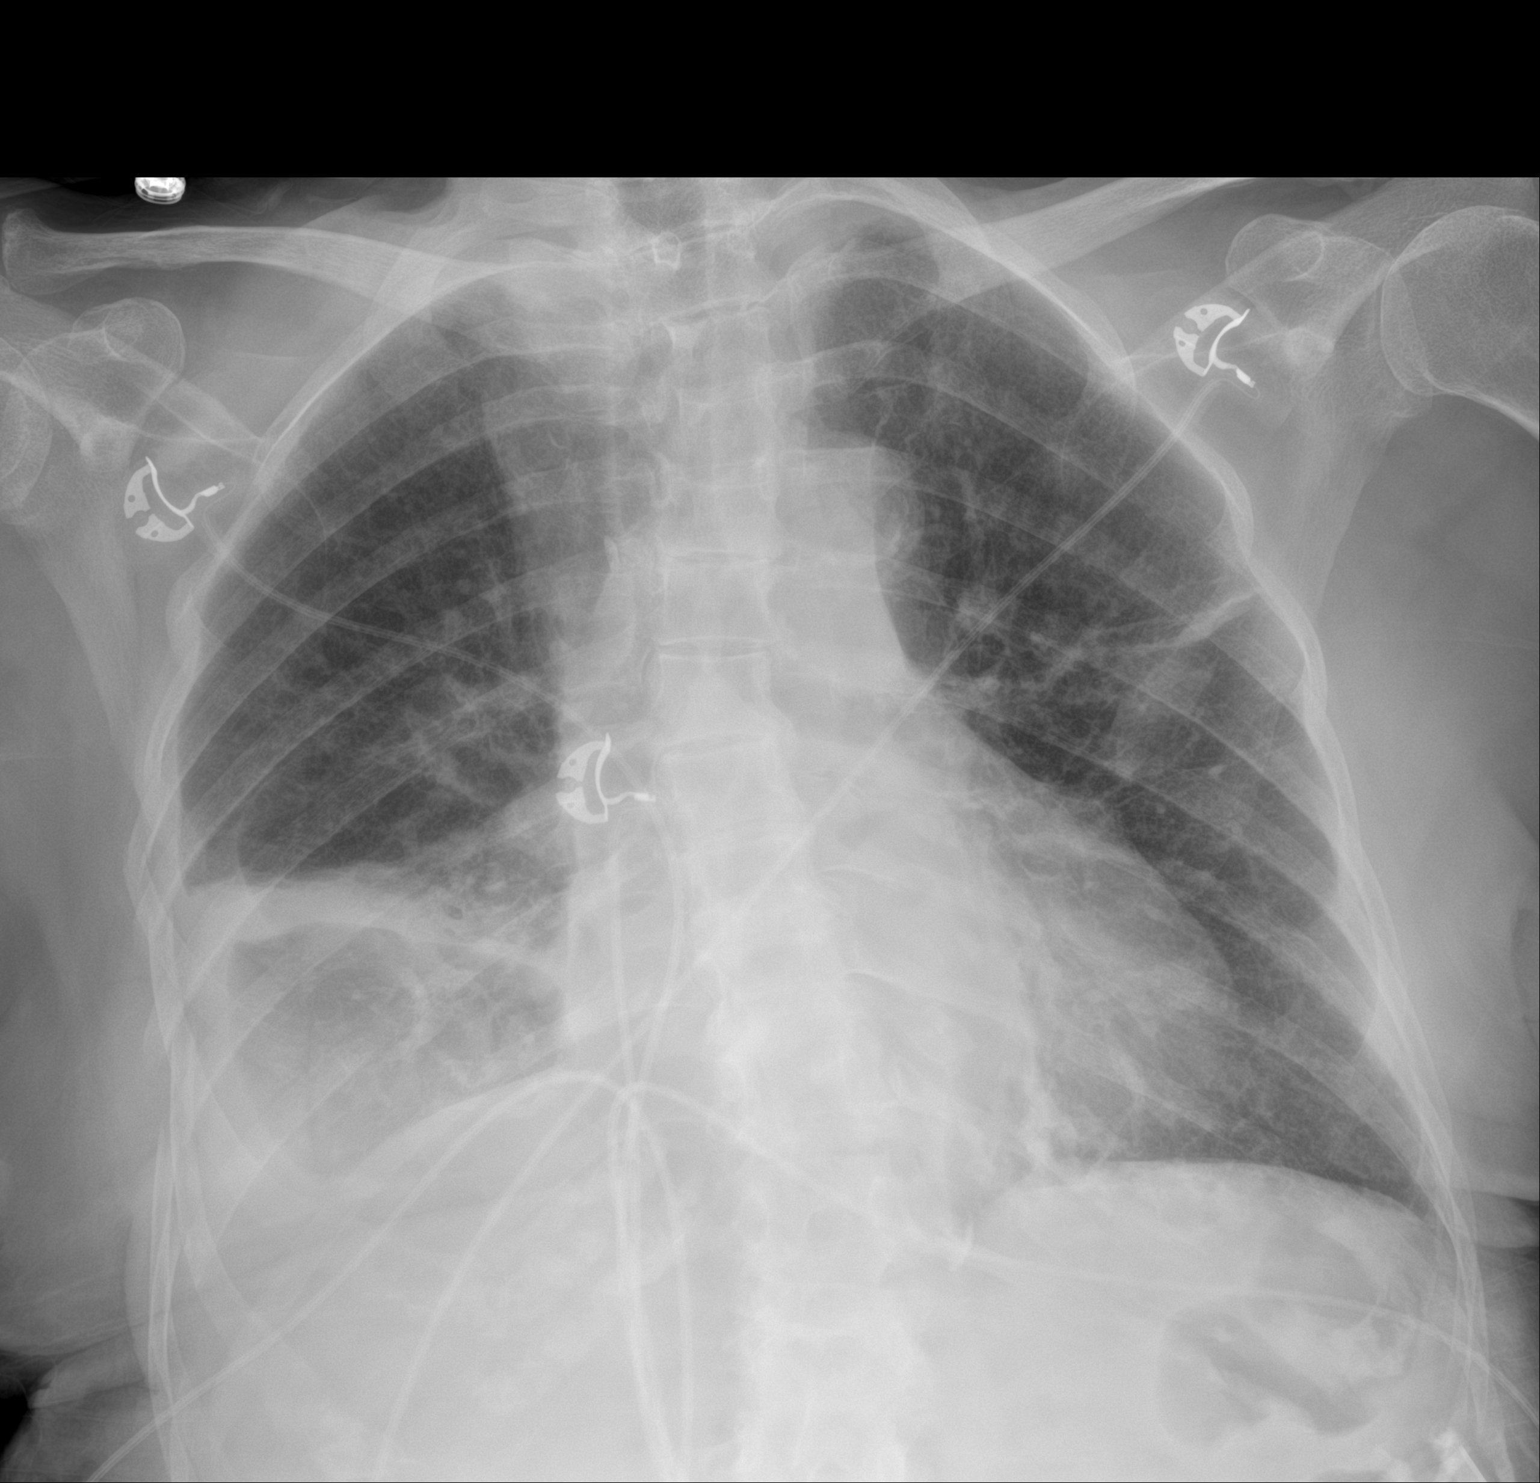

[1 of 1 positions shown; findings below may reference images not displayed]

FINDINGS: Stable cardiomediastinal silhouette with normal heart size. No
pneumothorax. Small right pleural effusion is slightly decreased. No
left pleural effusion. Minimal platelike atelectasis in the
peripheral left mid lung. Patchy opacity at the right lung base is
slightly improved.
IMPRESSION: 1. Small right pleural effusion, slightly decreased.
2. Patchy opacity at the right lung base, slightly improved.
3. Minimal peripheral left mid lung platelike atelectasis.

## 2023-08-12 IMAGING — DX DG CHEST 1V PORT
1 series · 1 of 1 positions shown · non-contrast
Comparison: 11/23/2021

CLINICAL DATA: Shortness of breath, pleural effusion

EXAM:
PORTABLE CHEST 1 VIEW

[chest ap]
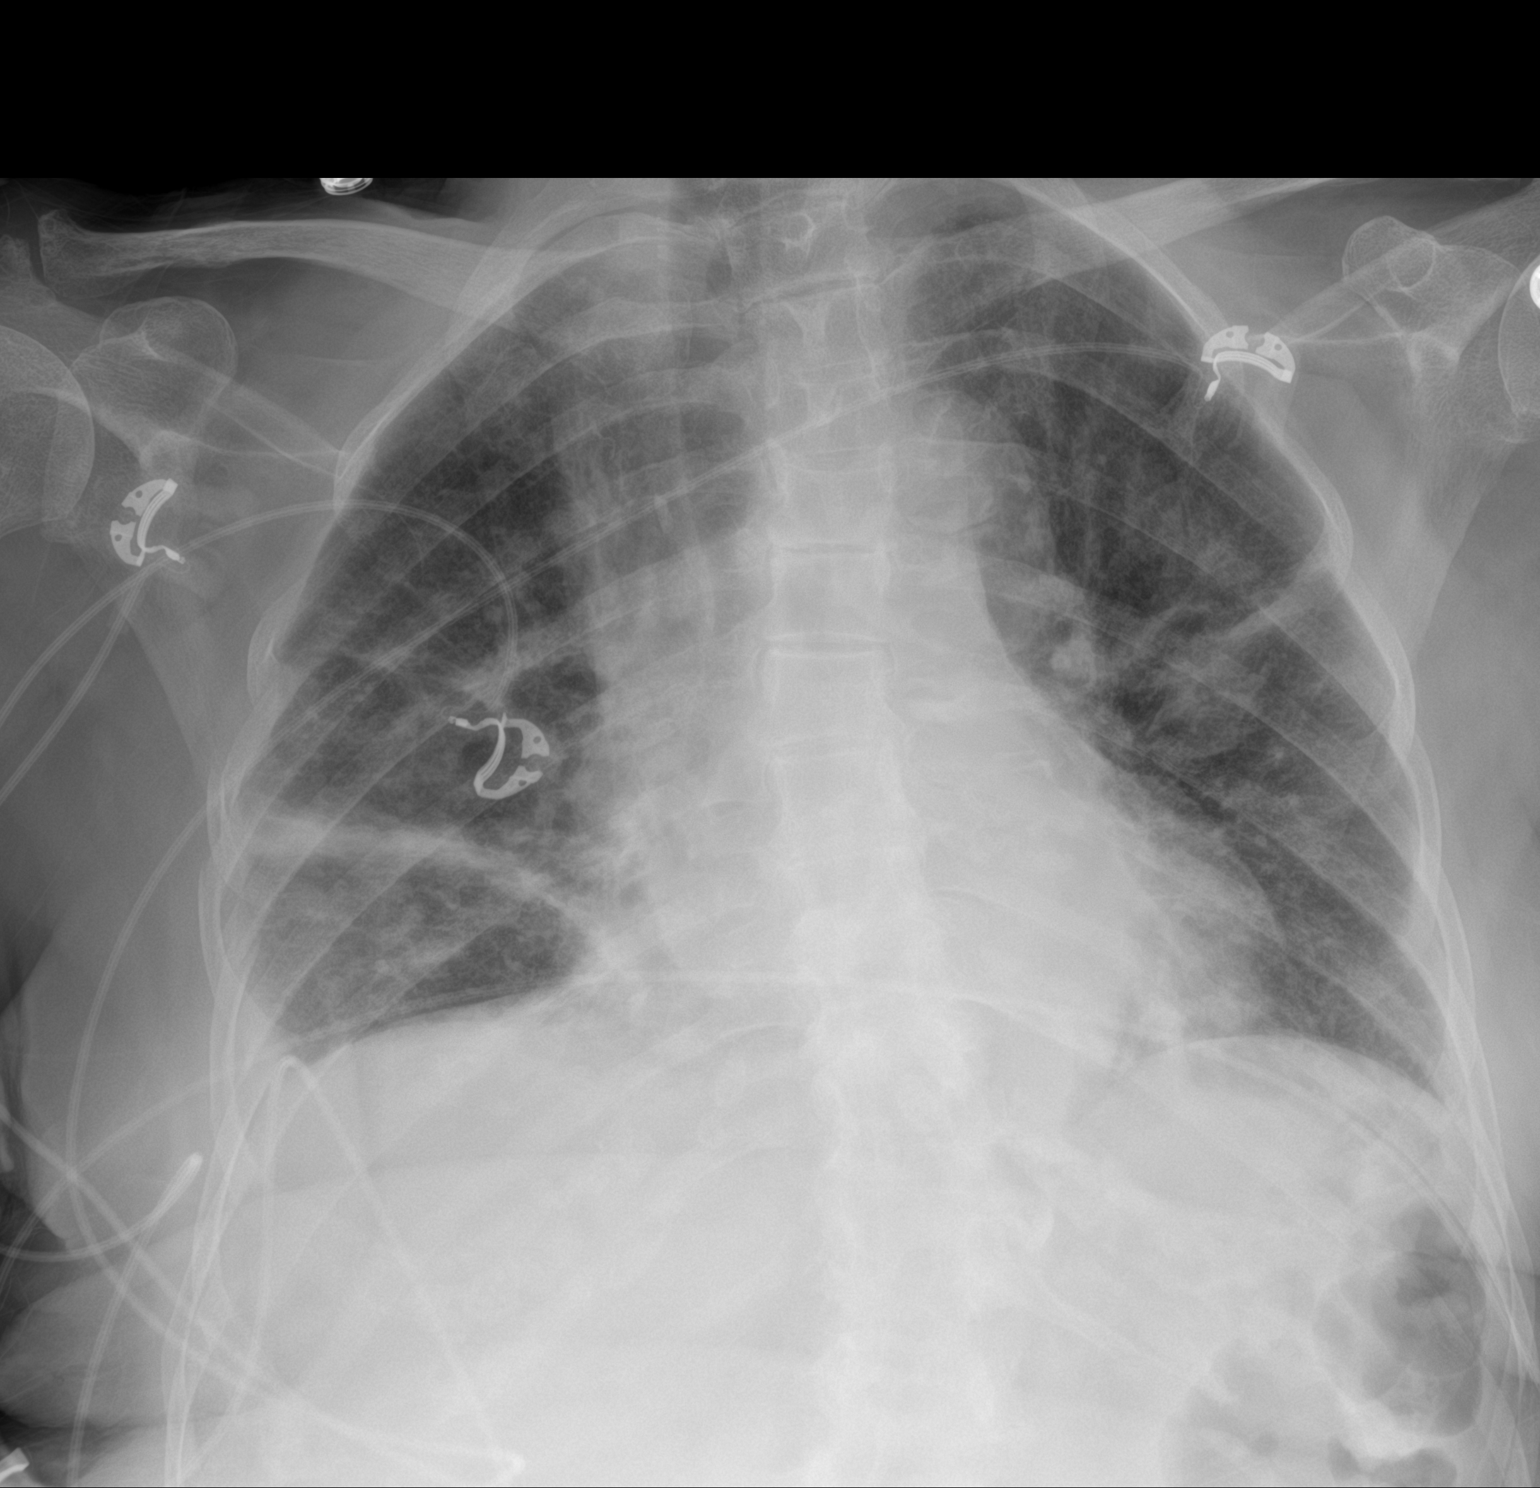

[1 of 1 positions shown; findings below may reference images not displayed]

FINDINGS: Patchy band like atelectasis bilaterally. Similar small residual
right pleural effusion. No pneumothorax. Stable cardiomediastinal
contours.
IMPRESSION: Patchy band like atelectasis bilaterally. Similar small right
pleural effusion. No pneumothorax.

## 2023-08-16 DIAGNOSIS — N1831 Chronic kidney disease, stage 3a: Secondary | ICD-10-CM | POA: Diagnosis not present

## 2023-08-16 DIAGNOSIS — Z1339 Encounter for screening examination for other mental health and behavioral disorders: Secondary | ICD-10-CM | POA: Diagnosis not present

## 2023-08-16 DIAGNOSIS — I10 Essential (primary) hypertension: Secondary | ICD-10-CM | POA: Diagnosis not present

## 2023-08-16 DIAGNOSIS — F419 Anxiety disorder, unspecified: Secondary | ICD-10-CM | POA: Diagnosis not present

## 2023-08-16 DIAGNOSIS — Z0000 Encounter for general adult medical examination without abnormal findings: Secondary | ICD-10-CM | POA: Diagnosis not present

## 2023-08-16 DIAGNOSIS — R82998 Other abnormal findings in urine: Secondary | ICD-10-CM | POA: Diagnosis not present

## 2023-08-16 DIAGNOSIS — I129 Hypertensive chronic kidney disease with stage 1 through stage 4 chronic kidney disease, or unspecified chronic kidney disease: Secondary | ICD-10-CM | POA: Diagnosis not present

## 2023-08-16 DIAGNOSIS — R7301 Impaired fasting glucose: Secondary | ICD-10-CM | POA: Diagnosis not present

## 2023-08-16 DIAGNOSIS — Z1331 Encounter for screening for depression: Secondary | ICD-10-CM | POA: Diagnosis not present

## 2023-08-16 DIAGNOSIS — F3342 Major depressive disorder, recurrent, in full remission: Secondary | ICD-10-CM | POA: Diagnosis not present

## 2023-08-16 DIAGNOSIS — E785 Hyperlipidemia, unspecified: Secondary | ICD-10-CM | POA: Diagnosis not present

## 2023-08-16 DIAGNOSIS — I739 Peripheral vascular disease, unspecified: Secondary | ICD-10-CM | POA: Diagnosis not present

## 2023-08-16 DIAGNOSIS — Z23 Encounter for immunization: Secondary | ICD-10-CM | POA: Diagnosis not present

## 2023-08-16 DIAGNOSIS — I6509 Occlusion and stenosis of unspecified vertebral artery: Secondary | ICD-10-CM | POA: Diagnosis not present

## 2023-09-21 IMAGING — DX DG CHEST 2V
2 series · 2 of 2 positions shown · non-contrast
Comparison: Chest x-ray 11/25/2021.

CLINICAL DATA: Recurrent right pleural effusion.

EXAM:
CHEST - 2 VIEW

[chest pa]
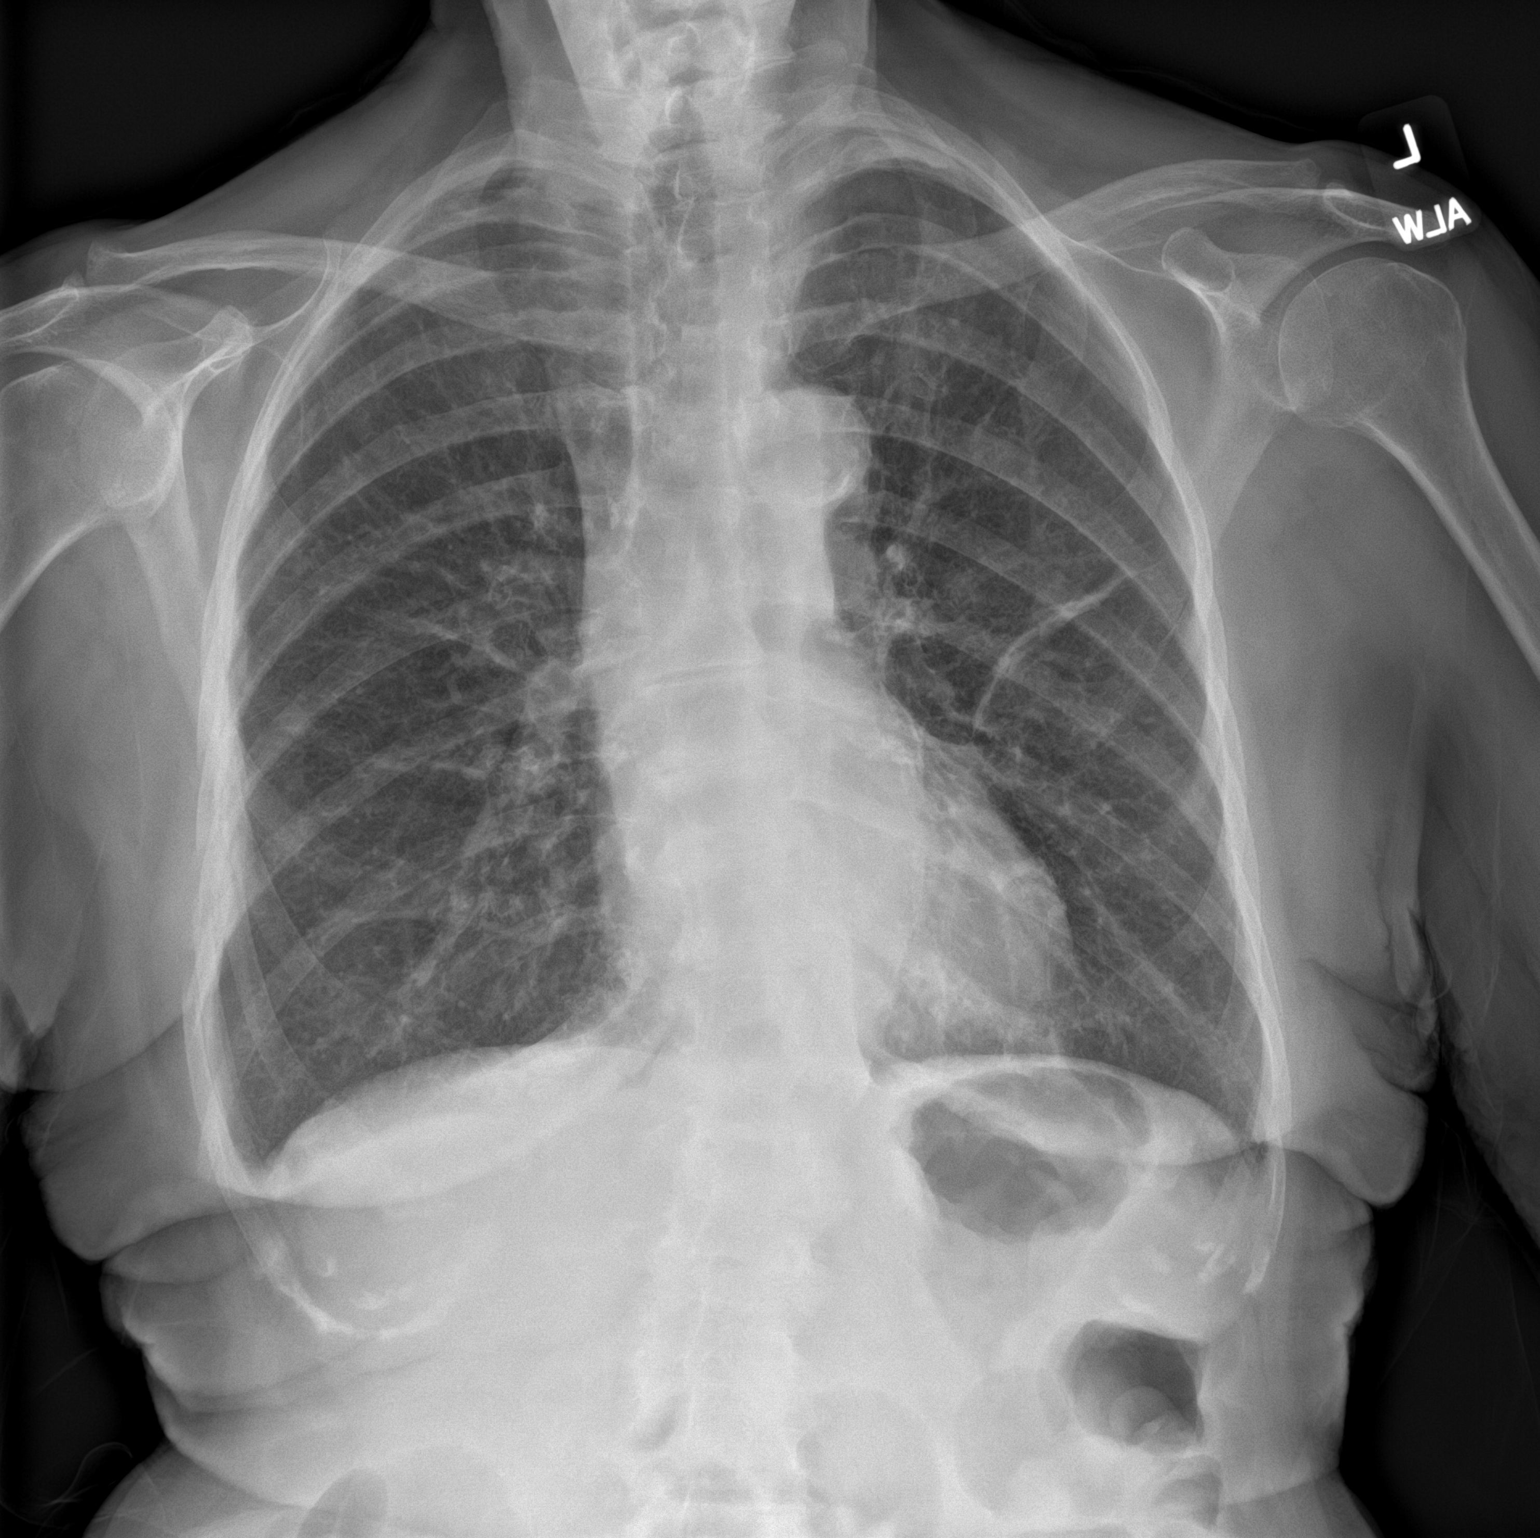

[chest lat]
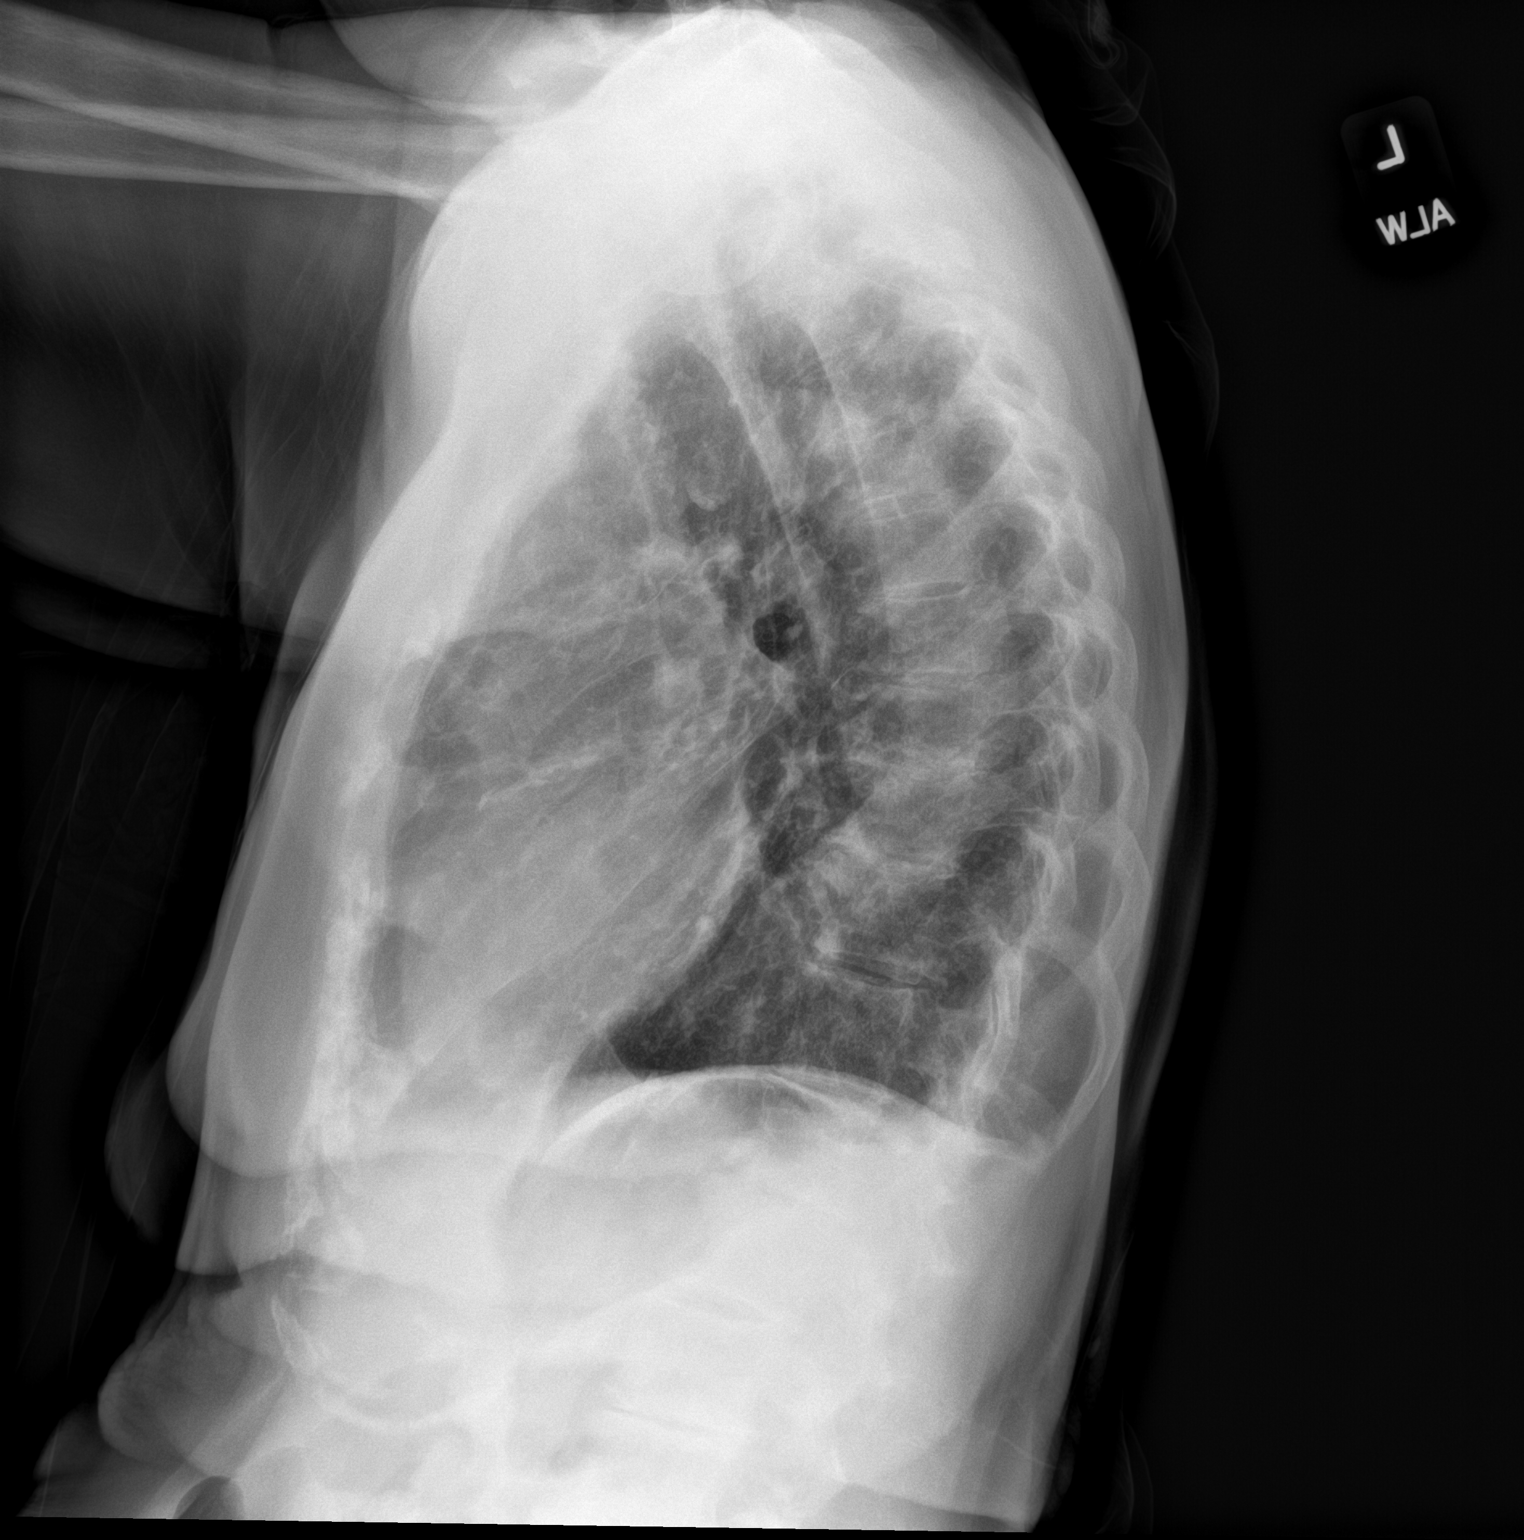

[2 of 2 positions shown; findings below may reference images not displayed]

FINDINGS: The heart size and mediastinal contours are within normal limits.
There is no pleural effusion or pneumothorax. There is linear
atelectasis or scarring in the left mid lung inches unchanged. The
visualized skeletal structures are unremarkable.
IMPRESSION: No active cardiopulmonary disease.

## 2023-10-05 DIAGNOSIS — H04123 Dry eye syndrome of bilateral lacrimal glands: Secondary | ICD-10-CM | POA: Diagnosis not present

## 2023-10-05 DIAGNOSIS — H35433 Paving stone degeneration of retina, bilateral: Secondary | ICD-10-CM | POA: Diagnosis not present

## 2023-10-05 DIAGNOSIS — H35033 Hypertensive retinopathy, bilateral: Secondary | ICD-10-CM | POA: Diagnosis not present

## 2023-10-05 DIAGNOSIS — H353232 Exudative age-related macular degeneration, bilateral, with inactive choroidal neovascularization: Secondary | ICD-10-CM | POA: Diagnosis not present

## 2023-10-05 DIAGNOSIS — H35371 Puckering of macula, right eye: Secondary | ICD-10-CM | POA: Diagnosis not present

## 2023-10-05 DIAGNOSIS — H43813 Vitreous degeneration, bilateral: Secondary | ICD-10-CM | POA: Diagnosis not present

## 2023-10-30 DIAGNOSIS — I129 Hypertensive chronic kidney disease with stage 1 through stage 4 chronic kidney disease, or unspecified chronic kidney disease: Secondary | ICD-10-CM | POA: Diagnosis not present

## 2023-10-30 DIAGNOSIS — R051 Acute cough: Secondary | ICD-10-CM | POA: Diagnosis not present

## 2023-10-30 DIAGNOSIS — J019 Acute sinusitis, unspecified: Secondary | ICD-10-CM | POA: Diagnosis not present

## 2024-01-23 DIAGNOSIS — L853 Xerosis cutis: Secondary | ICD-10-CM | POA: Diagnosis not present

## 2024-01-23 DIAGNOSIS — L821 Other seborrheic keratosis: Secondary | ICD-10-CM | POA: Diagnosis not present

## 2024-01-23 DIAGNOSIS — L814 Other melanin hyperpigmentation: Secondary | ICD-10-CM | POA: Diagnosis not present

## 2024-02-21 DIAGNOSIS — F419 Anxiety disorder, unspecified: Secondary | ICD-10-CM | POA: Diagnosis not present

## 2024-02-21 DIAGNOSIS — R7301 Impaired fasting glucose: Secondary | ICD-10-CM | POA: Diagnosis not present

## 2024-02-21 DIAGNOSIS — I129 Hypertensive chronic kidney disease with stage 1 through stage 4 chronic kidney disease, or unspecified chronic kidney disease: Secondary | ICD-10-CM | POA: Diagnosis not present

## 2024-02-21 DIAGNOSIS — N1831 Chronic kidney disease, stage 3a: Secondary | ICD-10-CM | POA: Diagnosis not present

## 2024-02-21 DIAGNOSIS — R0609 Other forms of dyspnea: Secondary | ICD-10-CM | POA: Diagnosis not present

## 2024-02-21 DIAGNOSIS — E785 Hyperlipidemia, unspecified: Secondary | ICD-10-CM | POA: Diagnosis not present

## 2024-02-21 DIAGNOSIS — I739 Peripheral vascular disease, unspecified: Secondary | ICD-10-CM | POA: Diagnosis not present

## 2024-02-21 DIAGNOSIS — E039 Hypothyroidism, unspecified: Secondary | ICD-10-CM | POA: Diagnosis not present

## 2024-02-21 DIAGNOSIS — F3342 Major depressive disorder, recurrent, in full remission: Secondary | ICD-10-CM | POA: Diagnosis not present

## 2024-07-17 ENCOUNTER — Telehealth: Payer: Self-pay | Admitting: Cardiovascular Disease

## 2024-07-17 NOTE — Telephone Encounter (Signed)
 Pt c/o BP issue: STAT if pt c/o blurred vision, one-sided weakness or slurred speech  1. What are your last 5 BP readings? 126/90  2. Are you having any other symptoms (ex. Dizziness, headache, blurred vision, passed out)? Headache   3. What is your BP issue? Pt has concerns about her readings and would like to speak with a nurse about it.   STAT if HR is under 50 or over 120  (normal HR is 60-100 beats per minute)  What is your heart rate? 120  Do you have a log of your heart rate readings (document readings)? No  Do you have any other symptoms? Headache
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
## 2024-07-18 NOTE — Telephone Encounter (Signed)
 Given markedly elevated HR and hx of atrial fib would prefer she not wait until 8/25 for an OV. Please call to inquire if she is available for one of Katlyn West, NP open slots this afternoon.   Nakeia Calvi S Epsie Walthall, NP

## 2024-07-18 NOTE — Telephone Encounter (Signed)
 Left message for pt to call.

## 2024-07-18 NOTE — Telephone Encounter (Signed)
 Spoke with pt, she is able to come to the appointment tomorrow.
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
## 2024-07-18 NOTE — Telephone Encounter (Signed)
 Spoke with pt, follow up scheduled for Friday 07/19/24.

## 2024-07-19 ENCOUNTER — Other Ambulatory Visit (INDEPENDENT_AMBULATORY_CARE_PROVIDER_SITE_OTHER)

## 2024-07-19 ENCOUNTER — Encounter: Payer: Self-pay | Admitting: Cardiology

## 2024-07-19 ENCOUNTER — Ambulatory Visit: Payer: Self-pay | Admitting: Cardiology

## 2024-07-19 ENCOUNTER — Telehealth: Payer: Self-pay | Admitting: Cardiology

## 2024-07-19 ENCOUNTER — Ambulatory Visit: Attending: Cardiology | Admitting: Cardiology

## 2024-07-19 VITALS — BP 128/78 | HR 116 | Temp 97.8°F | Wt 152.2 lb

## 2024-07-19 DIAGNOSIS — I484 Atypical atrial flutter: Secondary | ICD-10-CM

## 2024-07-19 DIAGNOSIS — I480 Paroxysmal atrial fibrillation: Secondary | ICD-10-CM

## 2024-07-19 DIAGNOSIS — I10 Essential (primary) hypertension: Secondary | ICD-10-CM | POA: Diagnosis not present

## 2024-07-19 LAB — MAGNESIUM: Magnesium: 1.9 mg/dL (ref 1.6–2.3)

## 2024-07-19 LAB — CBC
Hematocrit: 39.1 % (ref 34.0–46.6)
Hemoglobin: 13.3 g/dL (ref 11.1–15.9)
MCH: 32.8 pg (ref 26.6–33.0)
MCHC: 34 g/dL (ref 31.5–35.7)
MCV: 96 fL (ref 79–97)
Platelets: 264 x10E3/uL (ref 150–450)
RBC: 4.06 x10E6/uL (ref 3.77–5.28)
RDW: 12.3 % (ref 11.7–15.4)
WBC: 9.1 x10E3/uL (ref 3.4–10.8)

## 2024-07-19 LAB — COMPREHENSIVE METABOLIC PANEL WITH GFR
ALT: 13 IU/L (ref 0–32)
AST: 25 IU/L (ref 0–40)
Albumin: 4.1 g/dL (ref 3.6–4.6)
Alkaline Phosphatase: 98 IU/L (ref 44–121)
BUN/Creatinine Ratio: 17 (ref 12–28)
BUN: 20 mg/dL (ref 10–36)
Bilirubin Total: 0.6 mg/dL (ref 0.0–1.2)
CO2: 25 mmol/L (ref 20–29)
Calcium: 10 mg/dL (ref 8.7–10.3)
Chloride: 99 mmol/L (ref 96–106)
Creatinine, Ser: 1.18 mg/dL — ABNORMAL HIGH (ref 0.57–1.00)
Globulin, Total: 2.7 g/dL (ref 1.5–4.5)
Glucose: 103 mg/dL — ABNORMAL HIGH (ref 70–99)
Potassium: 5.2 mmol/L (ref 3.5–5.2)
Sodium: 139 mmol/L (ref 134–144)
Total Protein: 6.8 g/dL (ref 6.0–8.5)
eGFR: 44 mL/min/1.73 — ABNORMAL LOW (ref >59)

## 2024-07-19 LAB — T4, FREE: Free T4: 1.69 ng/dL (ref 0.82–1.77)

## 2024-07-19 LAB — TSH: TSH: 1.74 u[IU]/mL (ref 0.450–4.500)

## 2024-07-19 MED ORDER — METOPROLOL TARTRATE 25 MG PO TABS: 25.0000 mg | ORAL_TABLET | Freq: Two times a day (BID) | ORAL | 3 refills | Status: DC

## 2024-07-19 MED ORDER — APIXABAN 2.5 MG PO TABS: 2.5000 mg | ORAL_TABLET | Freq: Two times a day (BID) | ORAL | Status: DC

## 2024-07-19 MED ORDER — APIXABAN 5 MG PO TABS: 5.0000 mg | ORAL_TABLET | Freq: Two times a day (BID) | ORAL | 3 refills | Status: DC

## 2024-07-19 NOTE — Telephone Encounter (Signed)
 Called patient's son advised of below they verbalized understanding

## 2024-07-19 NOTE — Telephone Encounter (Signed)
 [employer] calling to report urgent results.

## 2024-07-19 NOTE — Telephone Encounter (Signed)
   Cardiac Monitor Alert  Date of alert:  07/19/2024 at 11:30:38 am  [name]  DOB: 08/27/1930  MRN: [id number]   [facility] Cardiologist: Vinie JAYSON Maxcy, MD  [facility] EP:  None    Monitor Information: Long Term Monitor-Live Telemetry [ZioAT]  Reason:  AFL (121-159 bpm), artifact, VE(s) Ordering provider:  Katlyn West, NP  Alert Atrial Fibrillation/Flutter This is the 1st alert for this rhythm.  The patient has a hx of Atrial Fibrillation/Flutter.    Anticoagulation medication as of 07/19/2024           apixaban  (ELIQUIS ) 2.5 MG TABS tablet Take 1 tablet (2.5 mg total) by mouth 2 (two) times daily.       Next Cardiology Appointment Date:  08/02/24  Provider:  Katlyn West, NP  The patient was contacted today.  She was symptomatic.  She reports the following symptoms:  Patient stated she felt out of breath at time of alert. Patient states she feels fine now.  Arrhythmia, symptoms and history reviewed with Katlyn West, NP.  Plan:  Patient started on metoprolol  and Eliquis  today. No further changes at this time per Katlyn West, NP.    Con FORBES Guys, RN  07/19/2024 3:37 PM

## 2024-07-19 NOTE — Telephone Encounter (Signed)
-----   Message from Katlyn D West sent at 07/19/2024  3:37 PM EDT ----- Please let Cheryl Peterson know that her CBC shows no evidence of anemia or infection, her kidney function is stable and her electrolytes are normal. Her liver function is normal. Her TSH and free T4 are  in normal range. Given her creatinine (1.18) and weight of 69 kg she does not meet criteria for reduced dose of Eliquis . She should take Eliquis  5 mg twice daily, continue metoprolol  tartrate 25 mg  twice daily.  ----- Message ----- From: Interface, [facility] Lab Results In Sent: 07/19/2024   2:36 PM EDT To: Katlyn D West, NP

## 2024-07-19 NOTE — Patient Instructions (Addendum)
 Medication Instructions:  Start metoprolol  tartrate 25 mg twice a day  Do not start Eliquis  until we call you *If you need a refill on your cardiac medications before your next appointment, please call your pharmacy*  Lab Work: Today we are going to draw a CBC, Cmet, TSH, Free T4, mag level at [facility]rd floor, [address] [name], [name], [place] [address] If you have labs (blood work) drawn today and your tests are completely normal, you will receive your results only by: MyChart Message (if you have MyChart) OR A paper copy in the mail If you have any lab test that is abnormal or we need to change your treatment, we will call you to review the results.  Testing/Procedures: No testing  Follow-Up: At [facility], you and your health needs are our priority.  As part of our continuing mission to provide you with exceptional heart care, our providers are all part of one team.  This team includes your primary Cardiologist (physician) and Advanced Practice Providers or APPs (Physician Assistants and Nurse Practitioners) who all work together to provide you with the care you need, when you need it.  Your next appointment:   2-3 week(s)  Provider:   Katlyn West, NP  Other Instructions: ZIO AT Long term monitor-Live Telemetry  Your physician has requested you wear a ZIO patch monitor for 14 days.  This is a single patch monitor. [employer] supplies one patch monitor per enrollment. Additional  stickers are not available.  Please do not apply patch if you will be having a Nuclear Stress Test, Echocardiogram, Cardiac CT, MRI,  or Chest Xray during the period you would be wearing the monitor. The patch cannot be worn during  these tests. You cannot remove and re-apply the ZIO AT patch monitor.  Your ZIO patch monitor will be mailed 3 day USPS to your address on file. It may take 3-5 days to  receive your monitor after you have been enrolled.  Once you have received your monitor,  please review the enclosed instructions. Your monitor has  already been registered assigning a specific monitor serial # to you.   Billing and Patient Assistance Program information  [name] has been supplied with any insurance information on record for billing. [employer] offers a sliding scale Patient Assistance Program for patients without insurance, or whose  insurance does not completely cover the cost of the ZIO patch monitor. You must apply for the  Patient Assistance Program to qualify for the discounted rate. To apply, call [employer] at [phone],  select option 4, select option 2 , ask to apply for the Patient Assistance Program, (you can request an  interpreter if needed). [employer] will ask your household income and how many people are in your  household. [employer] will quote your out-of-pocket cost based on this information. They will also be able  to set up a 12 month interest free payment plan if needed.  Applying the monitor   Shave hair from upper left chest.  Hold the abrader disc by orange tab. Rub the abrader in 40 strokes over left upper chest as indicated in  your monitor instructions.  Clean area with 4 enclosed alcohol  pads. Use all pads to ensure the area is cleaned thoroughly. Let  dry.  Apply patch as indicated in monitor instructions. Patch will be placed under collarbone on left side of  chest with arrow pointing upward.  Rub patch adhesive wings for 2 minutes. Remove the white label marked 1. Remove the white  label  marked 2. Rub patch adhesive wings for 2 additional minutes.  While looking in a mirror, press and release button in center of patch. A small green light will flash 3-4  times. This will be your only indicator that the monitor has been turned on.  Do not shower for the first 24 hours. You may shower after the first 24 hours.  Press the button if you feel a symptom. You will hear a small click. Record Date, Time and Symptom in  the Patient Log.    Starting the Gateway  In your kit there is a Audiological scientist box the size of a cellphone. This is Buyer, retail. It transmits all your  recorded data to [facility]. This box must always stay within 10 feet of you. Open the box and push the *  button. There will be a light that blinks orange and then green a few times. When the light stops  blinking, the Gateway is connected to the ZIO patch. Call [employer] at [phone] to confirm your monitor is transmitting.  Returning your monitor  Remove your patch and place it inside the Gateway. In the lower half of the Gateway there is a white  bag with prepaid postage on it. Place Gateway in bag and seal. Mail package back to [place] as soon as  possible. Your physician should have your final report approximately 7 days after you have mailed back  your monitor. Call [facility] Customer Care at [phone] if you have questions regarding your ZIO AT  patch monitor. Call them immediately if you see an orange light blinking on your monitor.  If your monitor falls off in less than 4 days, contact our Monitor department at [phone]. If your  monitor becomes loose or falls off after 4 days call [employer] at [phone] for suggestions on  securing your monitor

## 2024-07-20 ENCOUNTER — Telehealth: Payer: Self-pay | Admitting: Physician Assistant

## 2024-07-20 NOTE — Telephone Encounter (Signed)
 Pt seen yesterday for AFlutter w RVR. She was started on Eliquis  and Metoprolol  tartrate. She called to make sure she can take meds together. I advised her this is ok. Glendia Ferrier, PA-C    07/20/2024 9:43 AM

## 2024-07-21 ENCOUNTER — Emergency Department (HOSPITAL_COMMUNITY)

## 2024-07-21 ENCOUNTER — Inpatient Hospital Stay (HOSPITAL_COMMUNITY)
Admission: EM | Admit: 2024-07-21 | Discharge: 2024-07-27 | DRG: 291 | Disposition: A | Discharge status: Skilled Nursing Facility | Attending: Internal Medicine | Admitting: Internal Medicine

## 2024-07-21 ENCOUNTER — Other Ambulatory Visit: Payer: Self-pay

## 2024-07-21 DIAGNOSIS — I509 Heart failure, unspecified: Secondary | ICD-10-CM | POA: Diagnosis not present

## 2024-07-21 DIAGNOSIS — Z8249 Family history of ischemic heart disease and other diseases of the circulatory system: Secondary | ICD-10-CM | POA: Diagnosis not present

## 2024-07-21 DIAGNOSIS — Z79890 Hormone replacement therapy (postmenopausal): Secondary | ICD-10-CM | POA: Diagnosis not present

## 2024-07-21 DIAGNOSIS — R918 Other nonspecific abnormal finding of lung field: Secondary | ICD-10-CM | POA: Diagnosis not present

## 2024-07-21 DIAGNOSIS — E039 Hypothyroidism, unspecified: Secondary | ICD-10-CM | POA: Diagnosis present

## 2024-07-21 DIAGNOSIS — Z87891 Personal history of nicotine dependence: Secondary | ICD-10-CM | POA: Diagnosis not present

## 2024-07-21 DIAGNOSIS — Z7901 Long term (current) use of anticoagulants: Secondary | ICD-10-CM

## 2024-07-21 DIAGNOSIS — Z87440 Personal history of urinary (tract) infections: Secondary | ICD-10-CM | POA: Diagnosis not present

## 2024-07-21 DIAGNOSIS — I5021 Acute systolic (congestive) heart failure: Secondary | ICD-10-CM | POA: Diagnosis not present

## 2024-07-21 DIAGNOSIS — Z79899 Other long term (current) drug therapy: Secondary | ICD-10-CM | POA: Diagnosis not present

## 2024-07-21 DIAGNOSIS — E785 Hyperlipidemia, unspecified: Secondary | ICD-10-CM | POA: Diagnosis present

## 2024-07-21 DIAGNOSIS — K761 Chronic passive congestion of liver: Secondary | ICD-10-CM | POA: Diagnosis present

## 2024-07-21 DIAGNOSIS — E8779 Other fluid overload: Secondary | ICD-10-CM | POA: Diagnosis not present

## 2024-07-21 DIAGNOSIS — I480 Paroxysmal atrial fibrillation: Secondary | ICD-10-CM | POA: Diagnosis present

## 2024-07-21 DIAGNOSIS — Z885 Allergy status to narcotic agent status: Secondary | ICD-10-CM | POA: Diagnosis not present

## 2024-07-21 DIAGNOSIS — E871 Hypo-osmolality and hyponatremia: Secondary | ICD-10-CM | POA: Diagnosis present

## 2024-07-21 DIAGNOSIS — T502X5A Adverse effect of carbonic-anhydrase inhibitors, benzothiadiazides and other diuretics, initial encounter: Secondary | ICD-10-CM | POA: Diagnosis present

## 2024-07-21 DIAGNOSIS — J9601 Acute respiratory failure with hypoxia: Secondary | ICD-10-CM | POA: Diagnosis present

## 2024-07-21 DIAGNOSIS — N1831 Chronic kidney disease, stage 3a: Secondary | ICD-10-CM | POA: Diagnosis present

## 2024-07-21 DIAGNOSIS — I130 Hypertensive heart and chronic kidney disease with heart failure and stage 1 through stage 4 chronic kidney disease, or unspecified chronic kidney disease: Secondary | ICD-10-CM | POA: Diagnosis present

## 2024-07-21 DIAGNOSIS — E440 Moderate protein-calorie malnutrition: Secondary | ICD-10-CM | POA: Diagnosis present

## 2024-07-21 DIAGNOSIS — Z6825 Body mass index (BMI) 25.0-25.9, adult: Secondary | ICD-10-CM | POA: Diagnosis not present

## 2024-07-21 DIAGNOSIS — I4892 Unspecified atrial flutter: Secondary | ICD-10-CM

## 2024-07-21 DIAGNOSIS — R0902 Hypoxemia: Secondary | ICD-10-CM | POA: Diagnosis not present

## 2024-07-21 DIAGNOSIS — N179 Acute kidney failure, unspecified: Secondary | ICD-10-CM

## 2024-07-21 DIAGNOSIS — I10 Essential (primary) hypertension: Secondary | ICD-10-CM | POA: Diagnosis present

## 2024-07-21 DIAGNOSIS — Z823 Family history of stroke: Secondary | ICD-10-CM

## 2024-07-21 DIAGNOSIS — J810 Acute pulmonary edema: Secondary | ICD-10-CM | POA: Diagnosis not present

## 2024-07-21 DIAGNOSIS — N170 Acute kidney failure with tubular necrosis: Secondary | ICD-10-CM | POA: Diagnosis present

## 2024-07-21 DIAGNOSIS — Z832 Family history of diseases of the blood and blood-forming organs and certain disorders involving the immune mechanism: Secondary | ICD-10-CM | POA: Diagnosis not present

## 2024-07-21 DIAGNOSIS — I484 Atypical atrial flutter: Secondary | ICD-10-CM | POA: Diagnosis present

## 2024-07-21 DIAGNOSIS — I5031 Acute diastolic (congestive) heart failure: Secondary | ICD-10-CM

## 2024-07-21 DIAGNOSIS — R0989 Other specified symptoms and signs involving the circulatory and respiratory systems: Secondary | ICD-10-CM | POA: Diagnosis not present

## 2024-07-21 DIAGNOSIS — I4891 Unspecified atrial fibrillation: Secondary | ICD-10-CM | POA: Diagnosis not present

## 2024-07-21 DIAGNOSIS — Z880 Allergy status to penicillin: Secondary | ICD-10-CM | POA: Diagnosis not present

## 2024-07-21 DIAGNOSIS — R0602 Shortness of breath: Secondary | ICD-10-CM | POA: Diagnosis present

## 2024-07-21 DIAGNOSIS — I5033 Acute on chronic diastolic (congestive) heart failure: Secondary | ICD-10-CM | POA: Diagnosis present

## 2024-07-21 LAB — COMPREHENSIVE METABOLIC PANEL WITH GFR
ALT: 58 U/L — ABNORMAL HIGH (ref 0–44)
AST: 82 U/L — ABNORMAL HIGH (ref 15–41)
Albumin: 3.1 g/dL — ABNORMAL LOW (ref 3.5–5.0)
Alkaline Phosphatase: 80 U/L (ref 38–126)
Anion gap: 8 (ref 5–15)
BUN: 21 mg/dL (ref 8–23)
CO2: 24 mmol/L (ref 22–32)
Calcium: 8.8 mg/dL — ABNORMAL LOW (ref 8.9–10.3)
Chloride: 99 mmol/L (ref 98–111)
Creatinine, Ser: 1.06 mg/dL — ABNORMAL HIGH (ref 0.44–1.00)
GFR, Estimated: 50 mL/min — ABNORMAL LOW (ref >60)
Glucose, Bld: 185 mg/dL — ABNORMAL HIGH (ref 70–99)
Potassium: 4.8 mmol/L (ref 3.5–5.1)
Sodium: 131 mmol/L — ABNORMAL LOW (ref 135–145)
Total Bilirubin: 1.3 mg/dL — ABNORMAL HIGH (ref 0.0–1.2)
Total Protein: 6.8 g/dL (ref 6.5–8.1)

## 2024-07-21 LAB — CBC WITH DIFFERENTIAL/PLATELET
Abs Immature Granulocytes: 0.02 K/uL (ref 0.00–0.07)
Basophils Absolute: 0 K/uL (ref 0.0–0.1)
Basophils Relative: 1 %
Eosinophils Absolute: 0.3 K/uL (ref 0.0–0.5)
Eosinophils Relative: 3 %
HCT: 36.9 % (ref 36.0–46.0)
Hemoglobin: 12.4 g/dL (ref 12.0–15.0)
Immature Granulocytes: 0 %
Lymphocytes Relative: 16 %
Lymphs Abs: 1.4 K/uL (ref 0.7–4.0)
MCH: 31.2 pg (ref 26.0–34.0)
MCHC: 33.6 g/dL (ref 30.0–36.0)
MCV: 92.7 fL (ref 80.0–100.0)
Monocytes Absolute: 0.8 K/uL (ref 0.1–1.0)
Monocytes Relative: 9 %
Neutro Abs: 6.2 K/uL (ref 1.7–7.7)
Neutrophils Relative %: 71 %
Platelets: 255 K/uL (ref 150–400)
RBC: 3.98 MIL/uL (ref 3.87–5.11)
RDW: 13.3 % (ref 11.5–15.5)
WBC: 8.7 K/uL (ref 4.0–10.5)
nRBC: 0 % (ref 0.0–0.2)

## 2024-07-21 LAB — TROPONIN I (HIGH SENSITIVITY)
Troponin I (High Sensitivity): 20 ng/L — ABNORMAL HIGH (ref <18)
Troponin I (High Sensitivity): 13 ng/L (ref <18)

## 2024-07-21 LAB — BRAIN NATRIURETIC PEPTIDE: B Natriuretic Peptide: 305.8 pg/mL — ABNORMAL HIGH (ref 0.0–100.0)

## 2024-07-21 MED ORDER — FUROSEMIDE 10 MG/ML IJ SOLN
40.0000 mg | Freq: Two times a day (BID) | INTRAMUSCULAR | Status: DC
  Filled 2024-07-21: qty 4

## 2024-07-21 MED ORDER — FUROSEMIDE 10 MG/ML IJ SOLN
40.0000 mg | Freq: Once | INTRAMUSCULAR | Status: AC
  Administered 2024-07-21: 40 mg via INTRAVENOUS
  Filled 2024-07-21: qty 4

## 2024-07-21 MED ORDER — ACETAMINOPHEN 650 MG RE SUPP
650.0000 mg | Freq: Four times a day (QID) | RECTAL | Status: DC | PRN
Start: 2024-07-21 — End: 2024-07-27

## 2024-07-21 MED ORDER — ONDANSETRON HCL 4 MG/2ML IJ SOLN
4.0000 mg | Freq: Four times a day (QID) | INTRAMUSCULAR | Status: DC | PRN
  Administered 2024-07-21 – 2024-07-24 (×2): 4 mg via INTRAVENOUS
  Filled 2024-07-21 (×2): qty 2

## 2024-07-21 MED ORDER — PAROXETINE HCL 20 MG PO TABS
20.0000 mg | ORAL_TABLET | Freq: Every day | ORAL | Status: DC
  Administered 2024-07-21 – 2024-07-27 (×7): 20 mg via ORAL
  Filled 2024-07-21 (×7): qty 1

## 2024-07-21 MED ORDER — FUROSEMIDE 10 MG/ML IJ SOLN: 20.0000 mg | Freq: Two times a day (BID) | INTRAMUSCULAR | Status: DC

## 2024-07-21 MED ORDER — METOPROLOL TARTRATE 25 MG PO TABS
25.0000 mg | ORAL_TABLET | Freq: Two times a day (BID) | ORAL | Status: DC
  Administered 2024-07-23 – 2024-07-24 (×2): 25 mg via ORAL
  Filled 2024-07-21 (×4): qty 1

## 2024-07-21 MED ORDER — ATORVASTATIN CALCIUM 10 MG PO TABS
10.0000 mg | ORAL_TABLET | Freq: Every day | ORAL | Status: DC
  Administered 2024-07-22 – 2024-07-27 (×6): 10 mg via ORAL
  Filled 2024-07-21 (×6): qty 1

## 2024-07-21 MED ORDER — FUROSEMIDE 10 MG/ML IJ SOLN
40.0000 mg | Freq: Two times a day (BID) | INTRAMUSCULAR | Status: DC
  Administered 2024-07-21 – 2024-07-22 (×2): 40 mg via INTRAVENOUS
  Filled 2024-07-21 (×2): qty 4

## 2024-07-21 MED ORDER — PANTOPRAZOLE SODIUM 40 MG IV SOLR
40.0000 mg | Freq: Two times a day (BID) | INTRAVENOUS | Status: DC
  Administered 2024-07-21 – 2024-07-23 (×4): 40 mg via INTRAVENOUS
  Filled 2024-07-21 (×4): qty 10

## 2024-07-21 MED ORDER — APIXABAN 5 MG PO TABS
5.0000 mg | ORAL_TABLET | Freq: Two times a day (BID) | ORAL | Status: DC
  Administered 2024-07-21 – 2024-07-23 (×4): 5 mg via ORAL
  Filled 2024-07-21 (×4): qty 1

## 2024-07-21 MED ORDER — SODIUM CHLORIDE 0.9% FLUSH
3.0000 mL | Freq: Two times a day (BID) | INTRAVENOUS | Status: DC
  Administered 2024-07-21 – 2024-07-26 (×11): 3 mL via INTRAVENOUS

## 2024-07-21 MED ORDER — LEVOTHYROXINE SODIUM 50 MCG PO TABS
50.0000 ug | ORAL_TABLET | Freq: Every day | ORAL | Status: DC
  Administered 2024-07-22 – 2024-07-27 (×5): 50 ug via ORAL
  Filled 2024-07-21 (×5): qty 1

## 2024-07-21 MED ORDER — ACETAMINOPHEN 325 MG PO TABS: 650.0000 mg | ORAL_TABLET | Freq: Four times a day (QID) | ORAL | Status: DC | PRN

## 2024-07-21 MED ORDER — HYDRALAZINE HCL 20 MG/ML IJ SOLN
10.0000 mg | Freq: Four times a day (QID) | INTRAMUSCULAR | Status: DC | PRN
  Administered 2024-07-21: 10 mg via INTRAVENOUS
  Filled 2024-07-21: qty 1

## 2024-07-21 NOTE — H&P (Signed)
 History and Physical    Patient: Cheryl Peterson FMW:[id number] DOB: 28-Mar-1930 DOA: 07/21/2024 DOS: the patient was seen and examined on 07/21/2024 . PCP: Loreli Elsie [name], MD  Patient coming from: Home Chief complaint: Chief Complaint  Patient presents with   Shortness of Breath   HPI:  Cheryl Peterson is a 89 y.o. female with past medical history  of depression on Paxil , history of hyperlipidemia atorvastatin , essential hypertension metoprolol , CKD stage III, history of UTI, atrial flutter history, and paroxysmal A-fib on Eliquis , hypothyroidism, presenting today with shortness of breath.  Overall symptoms have been going on for the past few weeks but has been progressively getting worse.  Sats on initial presentation was 87% on room air patient was found to be in respiratory distress and placed on a nonrebreather.  Patient has been followed closely by cardiology and is currently wearing a Zio patch.  At bedside she is alert awake oriented gives brief history she is dyspneic and hypoxic.   ED Course:  Vital signs in the ED were notable for the following:  Vitals:   07/21/24 1600 07/21/24 1737 07/21/24 1800 07/21/24 1900  BP:  (!) 186/81 (!) 164/71 (!) 144/61  Pulse: 62 69 70 71  Temp: 98.6 F (37 C) 98.1 F (36.7 C)    Resp: (!) 24 (!) 21 20 20   Height:  5' 3 (1.6 m)    Weight:  66.3 kg    SpO2: 95% 93% 90% 90%  TempSrc: Oral Oral    BMI (Calculated):  25.9    >>ED evaluation thus far shows: CMP shows sodium 131 glucose 185 creatinine 1.06 albumin 3.1, AST 82, ALT 58, total bili 1.3, EGFR 50, BNP of 305.8, troponin of 20, normal CBC. Chest x-ray showing enlargement of cardiopericardial silhouette vascular congestion and diffuse interstitial opacity suggesting interstitial edema.  >>While in the ED patient received the following: Medications  furosemide  (LASIX ) injection 40 mg (40 mg Intravenous Given 07/21/24 1401)   Review of Systems  Respiratory:  Positive for shortness  of breath.   All other systems reviewed and are negative.  Past Medical History:  Diagnosis Date   A-fib (HCC)    Chest pain    Decreased diffusion capacity    Hyperlipidemia    Hypertension    SOB (shortness of breath)    Past Surgical History:  Procedure Laterality Date   IR THORACENTESIS ASP PLEURAL SPACE W/IMG GUIDE  11/17/2021   IR THORACENTESIS ASP PLEURAL SPACE W/IMG GUIDE  11/23/2021   TONSILLECTOMY  1939   VESICOVAGINAL FISTULA CLOSURE W/ TAH  10/28/84    reports that she quit smoking about 45 years ago. Her smoking use included cigarettes. She has never used smokeless tobacco. She reports that she does not drink alcohol  and does not use drugs. Allergies  Allergen Reactions   Penicillin G Sodium Swelling and Other (See Comments)    Lip numbness and swelling- no breathing issues, though Other reaction(s): lip numbness, swelling   Family History  Problem Relation Age of Onset   Stroke Mother    Hypertension Mother    Deep vein thrombosis Father    Clotting disorder Sister    Heart disease Sister    Heart failure Sister    Clotting disorder Brother    Heart disease Brother    Heart failure Brother    Colon cancer Neg Hx    Prior to Admission medications   Medication Sig Start Date End Date Taking? Authorizing Provider  apixaban  (ELIQUIS )  5 MG TABS tablet Take 1 tablet (5 mg total) by mouth 2 (two) times daily. 07/19/24   West, Katlyn D, NP  atorvastatin  (LIPITOR) 10 MG tablet Take 10 mg by mouth daily.    [provider]  levothyroxine  (SYNTHROID ) 50 MCG tablet Take 50 mcg by mouth daily before breakfast.    [provider]  metoprolol  tartrate (LOPRESSOR ) 25 MG tablet Take 1 tablet (25 mg total) by mouth 2 (two) times daily. 07/19/24 10/17/24  West, Katlyn D, NP  Multiple Vitamins-Minerals (CENTRUM SILVER 50+WOMEN) TABS Take 1 tablet by mouth daily with breakfast.    [provider]  PARoxetine  (PAXIL ) 20 MG tablet Take 20 mg by mouth  daily.    [provider]                                                                                 Vitals:   07/21/24 1600 07/21/24 1737 07/21/24 1800 07/21/24 1900  BP:  (!) 186/81 (!) 164/71 (!) 144/61  Pulse: 62 69 70 71  Resp: (!) 24 (!) 21 20 20   Temp: 98.6 F (37 C) 98.1 F (36.7 C)    TempSrc: Oral Oral    SpO2: 95% 93% 90% 90%  Weight:  66.3 kg    Height:  5' 3 (1.6 m)     Physical Exam Vitals reviewed.  Constitutional:      General: She is not in acute distress.    Appearance: She is ill-appearing.  HENT:     Head: Normocephalic.  Eyes:     Extraocular Movements: Extraocular movements intact.  Cardiovascular:     Rate and Rhythm: Normal rate and regular rhythm.     Heart sounds: Normal heart sounds.  Pulmonary:     Effort: Tachypnea present.     Breath sounds: Examination of the right-middle field reveals rales. Examination of the left-middle field reveals rales. Examination of the right-lower field reveals rales. Examination of the left-lower field reveals rales. Rales present.  Abdominal:     General: There is no distension.     Palpations: Abdomen is soft.     Tenderness: There is no abdominal tenderness.  Neurological:     General: No focal deficit present.     Mental Status: She is alert and oriented to person, place, and time.     Labs on Admission: I have personally reviewed following labs and imaging studies CBC: Recent Labs  Lab 07/19/24 1111 07/21/24 1221  WBC 9.1 8.7  NEUTROABS  --  6.2  HGB 13.3 12.4  HCT 39.1 36.9  MCV 96 92.7  PLT 264 255   Basic Metabolic Panel: Recent Labs  Lab 07/19/24 1111 07/21/24 1221  NA 139 131*  K 5.2 4.8  CL 99 99  CO2 25 24  GLUCOSE 103* 185*  BUN 20 21  CREATININE 1.18* 1.06*  CALCIUM  10.0 8.8*  MG 1.9  --    GFR: Estimated Creatinine Clearance: 31.7 mL/min (A) (by C-G formula based on SCr of 1.06 mg/dL (H)). Liver Function Tests: Recent Labs  Lab 07/19/24 1111 07/21/24 1221   AST 25 82*  ALT 13 58*  ALKPHOS 98 80  BILITOT 0.6 1.3*  PROT 6.8 6.8  ALBUMIN 4.1 3.1*   No results for input(s): LIPASE, AMYLASE in the last 168 hours. No results for input(s): AMMONIA in the last 168 hours. Recent Labs    07/19/24 1111 07/21/24 1221  BUN 20 21  CREATININE 1.18* 1.06*    Estimated Creatinine Clearance: 31.7 mL/min (A) (by C-G formula based on SCr of 1.06 mg/dL (H)).   Recent Labs    07/19/24 1111 07/21/24 1221  BUN 20 21  CREATININE 1.18* 1.06*  CO2 25 24   Cardiac Enzymes: No results for input(s): CKTOTAL, CKMB, CKMBINDEX, TROPONINI in the last 168 hours. BNP (last 3 results) No results for input(s): PROBNP in the last 8760 hours. HbA1C: No results for input(s): HGBA1C in the last 72 hours. CBG: No results for input(s): GLUCAP in the last 168 hours. Lipid Profile: No results for input(s): CHOL, HDL, LDLCALC, TRIG, CHOLHDL, LDLDIRECT in the last 72 hours. Thyroid  Function Tests: Recent Labs    07/19/24 1111  TSH 1.740  FREET4 1.69   Anemia Panel: No results for input(s): VITAMINB12, FOLATE, FERRITIN, TIBC, IRON, RETICCTPCT in the last 72 hours. Urine analysis:    Component Value Date/Time   COLORURINE STRAW (A) 10/29/2021 2002   APPEARANCEUR CLEAR 10/29/2021 2002   LABSPEC 1.019 10/29/2021 2002   PHURINE 7.0 10/29/2021 2002   GLUCOSEU NEGATIVE 10/29/2021 2002   HGBUR MODERATE (A) 10/29/2021 2002   BILIRUBINUR NEGATIVE 10/29/2021 2002   KETONESUR NEGATIVE 10/29/2021 2002   PROTEINUR NEGATIVE 10/29/2021 2002   UROBILINOGEN 0.2 08/29/2011 2145   NITRITE NEGATIVE 10/29/2021 2002   LEUKOCYTESUR NEGATIVE 10/29/2021 2002   Radiological Exams on Admission: DG Chest Portable 1 View Result Date: 07/21/2024 CLINICAL DATA:  Shortness of breath. EXAM: PORTABLE CHEST 1 VIEW COMPARISON:  01/03/2022 FINDINGS: The cardio pericardial silhouette is enlarged. Vascular congestion diffuse interstitial opacity  suggest interstitial edema. No acute bony abnormality. Telemetry leads overlie the chest. IMPRESSION: Enlargement of the cardiopericardial silhouette with vascular congestion and diffuse interstitial opacity suggesting interstitial edema. Electronically Signed   By: Camellia Candle M.D.   On: 07/21/2024 13:38   Data Reviewed: Relevant notes from primary care and specialist visits, past discharge summaries as available in EHR, including Care Everywhere . Prior diagnostic testing as pertinent to current admission diagnoses, Updated medications and problem lists for reconciliation .ED course, including vitals, labs, imaging, treatment and response to treatment,Triage notes, nursing and pharmacy notes and ED provider's notes.Notable results as noted in HPI.Discussed case with EDMD/ ED APP/ or Specialty MD on call and as needed.  Assessment & Plan  >> Respiratory distress/acute respiratory failure with hypoxia/acute congestive heart failure: Will admit to cardiac telemetry or progressive unit depending on bed availability an. Cardiology consulted. 2D echocardiogram ordered and pending. Continuing diuretic therapy with blood pressure control. Strict I's and O's Daily weights. Monitoring of kidney function and electrolytes. Low-sodium diet, heart healthy diet once able to take p.o. and stable. Supplemental oxygen for goal O2 sats above 90%.   >> History of paroxysmal atrial flutter/A-fib: We have continued patient on Eliquis , continue patient's metoprolol .   >> Hypothyroidism: Continue patient on levothyroxine  and check her TFTs.   >> Mildly abnormal LFTs: Attribute to passive hepatic congestion from her acute heart failure  >>AKI: Lab Results  Component Value Date   CREATININE 1.06 (H) 07/21/2024   CREATININE 1.18 (H) 07/19/2024   CREATININE 1.44 (H) 01/03/2022  Follow and renally dose meds.  Avoid contrast.   DVT prophylaxis:  Eliquis  Consults:  Cardiology  Advance Care Planning:     Code Status: Full Code   Family Communication:  None Disposition Plan:  Home Severity of Illness: The appropriate patient status for this patient is INPATIENT. Inpatient status is judged to be reasonable and necessary in order to provide the required intensity of service to ensure the patient's safety. The patient's presenting symptoms, physical exam findings, and initial radiographic and laboratory data in the context of their chronic comorbidities is felt to place them at high risk for further clinical deterioration. Furthermore, it is not anticipated that the patient will be medically stable for discharge from the hospital within 2 midnights of admission.   * I certify that at the point of admission it is my clinical judgment that the patient will require inpatient hospital care spanning beyond 2 midnights from the point of admission due to high intensity of service, high risk for further deterioration and high frequency of surveillance required.*  Unresulted Labs (From admission, onward)    None       Meds ordered this encounter  Medications   furosemide  (LASIX ) injection 40 mg   apixaban  (ELIQUIS ) tablet 5 mg   atorvastatin  (LIPITOR) tablet 10 mg   levothyroxine  (SYNTHROID ) tablet 50 mcg   metoprolol  tartrate (LOPRESSOR ) tablet 25 mg   PARoxetine  (PAXIL ) tablet 20 mg   sodium chloride  flush (NS) 0.9 % injection 3 mL   OR Linked Order Group    acetaminophen  (TYLENOL ) tablet 650 mg    acetaminophen  (TYLENOL ) suppository 650 mg   DISCONTD: furosemide  (LASIX ) injection 20 mg   DISCONTD: furosemide  (LASIX ) injection 20 mg   DISCONTD: furosemide  (LASIX ) injection 40 mg   hydrALAZINE  (APRESOLINE ) injection 10 mg   furosemide  (LASIX ) injection 40 mg   pantoprazole  (PROTONIX ) injection 40 mg   ondansetron  (ZOFRAN ) injection 4 mg     Orders Placed This Encounter  Procedures   Critical Care   DG Chest Portable 1 View   CBC with Differential   Comprehensive metabolic panel    Brain natriuretic peptide   Diet 2 gram sodium Room service appropriate? Yes; Fluid consistency: Thin   Notify physician (specify)   Initiate Heart Failure Care Plan   Daily weights   Strict intake and output   In and Out Cath   Patient Education:   Apply Heart Failure Care Plan   [facility] and [facility] only) Obtain REDS clips reading Every morning   Cardiac Monitoring Continuous x 24 hours Indications for use: Sub-acute heart failure   Maintain IV access   Vital signs   Notify physician (specify)   Mobility Protocol: No Restrictions   Refer to Sidebar Report Mobility Protocol for Adult Inpatient   Initiate Adult Central Line Maintenance and Catheter Protocol for patients with central line (CVC, PICC, Port, Hemodialysis, Trialysis)   If patient diabetic or glucose greater than 140 notify physician for Sliding Scale Insulin Orders   Initiate CHG Protocol   Do not place and if present remove PureWick   Initiate Oral Care Protocol   Initiate Carrier Fluid Protocol   RN may order General Admission PRN Orders utilizing General Admission PRN medications (through manage orders) for the following patient needs: allergy symptoms (Claritin), cold sores (Carmex), cough (Robitussin DM), eye irritation (Liquifilm Tears), hemorrhoids (Tucks), indigestion (Maalox), minor skin irritation (Hydrocortisone Cream), muscle pain (Ben Gay), nose irritation (saline nasal spray) and sore throat (Chloraseptic spray).   Ambulate with assistance   Full code   Consult to hospitalist   Consult to Transition of Care Team  Consult to Heart Failure Navigation Team ([facility], [facility], and [facility])   Nutritional services consult   OT eval and treat   PT eval and treat   Pulse oximetry check with vital signs   Oxygen therapy Mode or (Route): Nasal cannula; Liters Per Minute: 2; Keep O2 saturation between: greater than 92 %   Incentive spirometry   ED EKG   ECHOCARDIOGRAM COMPLETE   Insert peripheral IV   Place in observation (patient's  expected length of stay will be less than 2 midnights)   Aspiration precautions   Fall precautions    Author: Mario LULLA Blanch, MD 12 pm- 8 pm. [facility]. 07/21/2024 7:21 PM Please note for any communication after hours contact [facility] Assigned provider on call on Amion.
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
## 2024-07-21 NOTE — ED Triage Notes (Signed)
 Pt to ED via EMS with c/o SOB x2 weeks, worse today. Pt started metoprolol  yesterday. Pt 87% SpO2 room air for EMS. EMS placed pt on 15L NRB.  Pt 91% on RA currently placed on 2L nasal cannula satting at 94%. +accessory muscle use. Pt speaking in clear, full sentences. Pt A&Ox4.
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
## 2024-07-21 NOTE — ED Notes (Signed)
 CCMD called.

## 2024-07-22 ENCOUNTER — Ambulatory Visit (HOSPITAL_BASED_OUTPATIENT_CLINIC_OR_DEPARTMENT_OTHER): Admitting: Family

## 2024-07-22 ENCOUNTER — Observation Stay (HOSPITAL_COMMUNITY)

## 2024-07-22 ENCOUNTER — Other Ambulatory Visit: Payer: Self-pay

## 2024-07-22 DIAGNOSIS — I4892 Unspecified atrial flutter: Secondary | ICD-10-CM | POA: Diagnosis not present

## 2024-07-22 DIAGNOSIS — Z87440 Personal history of urinary (tract) infections: Secondary | ICD-10-CM | POA: Diagnosis not present

## 2024-07-22 DIAGNOSIS — N170 Acute kidney failure with tubular necrosis: Secondary | ICD-10-CM | POA: Diagnosis present

## 2024-07-22 DIAGNOSIS — N1831 Chronic kidney disease, stage 3a: Secondary | ICD-10-CM | POA: Diagnosis present

## 2024-07-22 DIAGNOSIS — Z880 Allergy status to penicillin: Secondary | ICD-10-CM | POA: Diagnosis not present

## 2024-07-22 DIAGNOSIS — N179 Acute kidney failure, unspecified: Secondary | ICD-10-CM | POA: Diagnosis not present

## 2024-07-22 DIAGNOSIS — Z8249 Family history of ischemic heart disease and other diseases of the circulatory system: Secondary | ICD-10-CM | POA: Diagnosis not present

## 2024-07-22 DIAGNOSIS — I5021 Acute systolic (congestive) heart failure: Secondary | ICD-10-CM | POA: Diagnosis not present

## 2024-07-22 DIAGNOSIS — E871 Hypo-osmolality and hyponatremia: Secondary | ICD-10-CM | POA: Diagnosis present

## 2024-07-22 DIAGNOSIS — I484 Atypical atrial flutter: Secondary | ICD-10-CM | POA: Diagnosis present

## 2024-07-22 DIAGNOSIS — J9601 Acute respiratory failure with hypoxia: Secondary | ICD-10-CM | POA: Diagnosis present

## 2024-07-22 DIAGNOSIS — Z79890 Hormone replacement therapy (postmenopausal): Secondary | ICD-10-CM | POA: Diagnosis not present

## 2024-07-22 DIAGNOSIS — I480 Paroxysmal atrial fibrillation: Secondary | ICD-10-CM | POA: Diagnosis present

## 2024-07-22 DIAGNOSIS — R0602 Shortness of breath: Secondary | ICD-10-CM | POA: Diagnosis present

## 2024-07-22 DIAGNOSIS — T502X5A Adverse effect of carbonic-anhydrase inhibitors, benzothiadiazides and other diuretics, initial encounter: Secondary | ICD-10-CM | POA: Diagnosis present

## 2024-07-22 DIAGNOSIS — I130 Hypertensive heart and chronic kidney disease with heart failure and stage 1 through stage 4 chronic kidney disease, or unspecified chronic kidney disease: Secondary | ICD-10-CM | POA: Diagnosis present

## 2024-07-22 DIAGNOSIS — Z79899 Other long term (current) drug therapy: Secondary | ICD-10-CM | POA: Diagnosis not present

## 2024-07-22 DIAGNOSIS — Z7901 Long term (current) use of anticoagulants: Secondary | ICD-10-CM | POA: Diagnosis not present

## 2024-07-22 DIAGNOSIS — K761 Chronic passive congestion of liver: Secondary | ICD-10-CM | POA: Diagnosis present

## 2024-07-22 DIAGNOSIS — Z885 Allergy status to narcotic agent status: Secondary | ICD-10-CM | POA: Diagnosis not present

## 2024-07-22 DIAGNOSIS — E440 Moderate protein-calorie malnutrition: Secondary | ICD-10-CM | POA: Diagnosis present

## 2024-07-22 DIAGNOSIS — I509 Heart failure, unspecified: Secondary | ICD-10-CM

## 2024-07-22 DIAGNOSIS — Z823 Family history of stroke: Secondary | ICD-10-CM | POA: Diagnosis not present

## 2024-07-22 DIAGNOSIS — Z6825 Body mass index (BMI) 25.0-25.9, adult: Secondary | ICD-10-CM | POA: Diagnosis not present

## 2024-07-22 DIAGNOSIS — E785 Hyperlipidemia, unspecified: Secondary | ICD-10-CM | POA: Diagnosis present

## 2024-07-22 DIAGNOSIS — I5033 Acute on chronic diastolic (congestive) heart failure: Secondary | ICD-10-CM | POA: Diagnosis present

## 2024-07-22 DIAGNOSIS — Z832 Family history of diseases of the blood and blood-forming organs and certain disorders involving the immune mechanism: Secondary | ICD-10-CM | POA: Diagnosis not present

## 2024-07-22 DIAGNOSIS — E039 Hypothyroidism, unspecified: Secondary | ICD-10-CM | POA: Diagnosis present

## 2024-07-22 DIAGNOSIS — Z87891 Personal history of nicotine dependence: Secondary | ICD-10-CM | POA: Diagnosis not present

## 2024-07-22 DIAGNOSIS — I4891 Unspecified atrial fibrillation: Secondary | ICD-10-CM | POA: Diagnosis not present

## 2024-07-22 LAB — BASIC METABOLIC PANEL WITH GFR
Anion gap: 13 (ref 5–15)
BUN: 31 mg/dL — ABNORMAL HIGH (ref 8–23)
CO2: 24 mmol/L (ref 22–32)
Calcium: 8.6 mg/dL — ABNORMAL LOW (ref 8.9–10.3)
Chloride: 90 mmol/L — ABNORMAL LOW (ref 98–111)
Creatinine, Ser: 2.61 mg/dL — ABNORMAL HIGH (ref 0.44–1.00)
GFR, Estimated: 17 mL/min — ABNORMAL LOW (ref >60)
Glucose, Bld: 173 mg/dL — ABNORMAL HIGH (ref 70–99)
Potassium: 3.9 mmol/L (ref 3.5–5.1)
Sodium: 127 mmol/L — ABNORMAL LOW (ref 135–145)

## 2024-07-22 LAB — ECHOCARDIOGRAM COMPLETE
AR max vel: 3.21 cm2
AV Peak grad: 5.4 mmHg
Ao pk vel: 1.16 m/s
Area-P 1/2: 3.48 cm2
Height: 63 in
S' Lateral: 1.6 cm
Weight: 2331.58 [oz_av]

## 2024-07-22 NOTE — Progress Notes (Signed)
 Progress Note  [name] Date of Encounter: 07/22/2024  Primary Cardiologist: Vinie JAYSON Maxcy, MD   Subjective   Patient seen examined at her bedside.  Inpatient Medications    Scheduled Meds:  apixaban   5 mg Oral BID   atorvastatin   10 mg Oral Daily   furosemide   40 mg Intravenous BID   levothyroxine   50 mcg Oral QAC breakfast   metoprolol  tartrate  25 mg Oral BID   pantoprazole  (PROTONIX ) IV  40 mg Intravenous Q12H   PARoxetine   20 mg Oral Daily   sodium chloride  flush  3 mL Intravenous Q12H   Continuous Infusions:  PRN Meds: acetaminophen  **OR** acetaminophen , hydrALAZINE , ondansetron  (ZOFRAN ) IV   Vital Signs    Vitals:   07/22/24 0500 07/22/24 0600 07/22/24 0610 07/22/24 0734  BP: (!) 93/45 (!) 104/46  (!) 111/54  Pulse: 60 (!) 57 (!) 58   Resp: 14 18 (!) 22 16  Temp: 99.2 F (37.3 C)   99 F (37.2 C)  TempSrc: Oral   Oral  SpO2: 95% 96% 96%   Weight:   66.1 kg   Height:        Intake/Output Summary (Last 24 hours) at 07/22/2024 0842 Last data filed at 07/22/2024 0557 Gross per 24 hour  Intake 3 ml  Output 800 ml  Net -797 ml   Filed Weights   07/21/24 1216 07/21/24 1737 07/22/24 0610  Weight: 69 kg 66.3 kg 66.1 kg    Telemetry    Sinus bradycardia- Personally Reviewed  ECG     - Personally Reviewed  Physical Exam    General: Comfortable Head: Atraumatic, normal size  Eyes: PEERLA, EOMI  Neck: Supple, normal JVD Cardiac: Normal S1, S2; RRR; no murmurs, rubs, or gallops Lungs: Clear to auscultation bilaterally Abd: Soft, nontender, no hepatomegaly  Ext: warm, no edema Musculoskeletal: No deformities, BUE and BLE strength normal and equal Skin: Warm and dry, no rashes   Neuro: Alert and oriented to person, place, time, and situation, CNII-XII grossly intact, no focal deficits  Psych: Normal mood and affect   Labs    Chemistry Recent Labs  Lab 07/19/24 1111 07/21/24 1221  NA 139 131*  K 5.2 4.8  CL 99 99   CO2 25 24  GLUCOSE 103* 185*  BUN 20 21  CREATININE 1.18* 1.06*  CALCIUM  10.0 8.8*  PROT 6.8 6.8  ALBUMIN 4.1 3.1*  AST 25 82*  ALT 13 58*  ALKPHOS 98 80  BILITOT 0.6 1.3*  GFRNONAA  --  50*  ANIONGAP  --  8     Hematology Recent Labs  Lab 07/19/24 1111 07/21/24 1221  WBC 9.1 8.7  RBC 4.06 3.98  HGB 13.3 12.4  HCT 39.1 36.9  MCV 96 92.7  MCH 32.8 31.2  MCHC 34.0 33.6  RDW 12.3 13.3  PLT 264 255    Cardiac EnzymesNo results for input(s): TROPONINI in the last 168 hours. No results for input(s): TROPIPOC in the last 168 hours.   BNP Recent Labs  Lab 07/21/24 1221  BNP 305.8*     DDimer No results for input(s): DDIMER in the last 168 hours.   Radiology    DG Chest Portable 1 View Result Date: 07/21/2024 CLINICAL DATA:  Shortness of breath. EXAM: PORTABLE CHEST 1 VIEW COMPARISON:  01/03/2022 FINDINGS: The cardio pericardial silhouette is enlarged. Vascular congestion diffuse interstitial opacity suggest interstitial edema. No acute bony abnormality. Telemetry leads overlie the chest. IMPRESSION: Enlargement of the  cardiopericardial silhouette with vascular congestion and diffuse interstitial opacity suggesting interstitial edema. Electronically Signed   By: Camellia Candle M.D.   On: 07/21/2024 13:38    Cardiac Studies  Echo pending  Patient Profile     89 y.o. female with history of atrial flutter, hypertension, hyperlipidemia  Assessment & Plan    Paroxysmal atrial flutter Acute on chronic heart failure with preserved ejection fraction  Clinically she is still appears to be volume overloaded we will continue current diuretic dosing Lasix  40 mg twice daily.  She is at a negative balance today.  Her echocardiogram is pending, previous EF in 2022 normal.  In terms of her atrial flutter she is in sinus bradycardia today we will continue heart rate lowering agents as well as her Eliquis    For questions or updates, please contact [facility] Please  consult [email or web address] for contact info under Cardiology/STEMI.      Signed, Tyrell Brereton, DO  07/22/2024, 8:42 AM

## 2024-07-22 NOTE — Progress Notes (Signed)
 Initial Nutrition Assessment  DOCUMENTATION CODES:   Not applicable  INTERVENTION:   -Liberalize diet to 2 gram sodium (with 1.5 L fluid restriction) for wider variety of meal selections -Magic cup BID with meals, each supplement provides 290 kcal and 9 grams of protein  -MVI with minerals daily -RD provided Low Sodium Nutrition Therapy handout from [facility] Nutrition Care Manual; attached to AVS/ discharge summary   NUTRITION DIAGNOSIS:   Increased nutrient needs related to chronic illness (CHF) as evidenced by estimated needs.  GOAL:   Patient will meet greater than or equal to 90% of their needs  MONITOR:   PO intake, Supplement acceptance  REASON FOR ASSESSMENT:   Consult Assessment of nutrition requirement/status  ASSESSMENT:   Pt with past medical history  of depression on Paxil , history of hyperlipidemia atorvastatin , essential hypertension metoprolol , CKD stage III, history of UTI, atrial flutter history, and paroxysmal A-fib on Eliquis , hypothyroidism, presenting with shortness of breath.  Pt admitted with respiratory distress and acute respiratiory failure secondary to acute congestive heart failure.   Reviewed I/O's: -797 ml x 24 hours  UOP: 800 ml x 24 hours  Pt unavailable at time of visit. Attempted to speak with pt via call to hospital room phone, however, unable to reach. RD unable to obtain further nutrition-related history or complete nutrition-focused physical exam at this time.    Case discussed with RN, who reports pt with a good appetite. She consumed all of her breakfast this morning.   Wt has been stable over the past several years. Pt with CHF; suspect edema may be masking true weight loss as well as fat and muscle depletions.   Medications reviewed and include protonix  and lasix .   Labs reviewed: Na: 127.    Diet Order:   Diet Order             Diet 2 gram sodium Fluid consistency: Thin; Fluid restriction: 1500 mL Fluid  Diet effective  now                   EDUCATION NEEDS:   No education needs have been identified at this time  Skin:  Skin Assessment: Reviewed RN Assessment  Last BM:  07/21/24  Height:   Ht Readings from Last 1 Encounters:  07/21/24 5' 3 (1.6 m)    Weight:   Wt Readings from Last 1 Encounters:  07/22/24 66.1 kg    Ideal Body Weight:  52.3 kg  BMI:  Body mass index is 25.81 kg/m.  Estimated Nutritional Needs:   Kcal:  1650-1850  Protein:  80-95 grams  Fluid:  1.5 L    Margery ORN, RD, LDN, CDCES Registered Dietitian III Certified Diabetes Care and Education Specialist If unable to reach this RD, please use RD Inpatient group chat on secure chat between hours of 8am-4 pm daily

## 2024-07-22 NOTE — Care Management Obs Status (Signed)
 MEDICARE OBSERVATION STATUS NOTIFICATION   Patient Details  Name: Cheryl Peterson MRN: [id number] Date of Birth: 10/11/1930   Medicare Observation Status Notification Given:  Yes  Obs letter signed and copy given.   Esta Carmon 07/22/2024, 1:06 PM
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
## 2024-07-22 NOTE — Evaluation (Addendum)
 Physical Therapy Evaluation Patient Details Name: Cheryl Peterson MRN: [id number] DOB: 27-Jun-1930 Today's Date: 07/22/2024  History of Present Illness  89 yo female admitted 8/24 with SOB Chest xray enlargement of cardiopericardial silhouette vascular congestion and diffuse interstitial opacity suggesting interstitial edema.(+) PMH Afib HLD HTN, thoracentesis 2022 depression on Paxil  CKD III.   Clinical Impression  Pt admitted with above diagnosis. Previously independent, but reports multiple falls at ILF, not using AD. Educated on importance of AD use. Able to ambulate >100 feet today, SpO2 85% on RA, up to 91% on 2L. At rest, SpO2 95% on 2L, 86% on RA.   Pt agreeable to POC. Will follow acutely. Pt currently with functional limitations due to the deficits listed below (see PT Problem List). Pt will benefit from acute skilled PT to increase their independence and safety with mobility to allow discharge.      Addendum 1331: Spoke with OT after their assessment; Pt had significant issues navigating in congested area with RW, LOB. May need to consider SNF initially - son is reportedly checking with ILF to see how much support staff can provide. See OT note for details.      If plan is discharge home, recommend the following: A little help with bathing/dressing/bathroom;Assistance with cooking/housework;Assist for transportation   Can travel by private vehicle        Equipment Recommendations Rollator (4 wheels)  Recommendations for Other Services       Functional Status Assessment Patient has had a recent decline in their functional status and demonstrates the ability to make significant improvements in function in a reasonable and predictable amount of time.     Precautions / Restrictions Precautions Precautions: Fall Recall of Precautions/Restrictions: Intact Precaution/Restrictions Comments: monitor O2 Restrictions Weight Bearing Restrictions Per Provider Order: No       Mobility  Bed Mobility Overal bed mobility: Modified Independent             General bed mobility comments: Mod I to rise to EOB with a little extra time.    Transfers Overall transfer level: Needs assistance Equipment used: Rolling walker (2 wheels) Transfers: Sit to/from Stand Sit to Stand: Contact guard assist           General transfer comment: CGA for safety. rocks for [employer] Educated on technique with hand placement which was helpful after her first attempt. States this is baseline difficulty for her.    Ambulation/Gait Ambulation/Gait assistance: Supervision Gait Distance (Feet): 105 Feet Assistive device: Rolling walker (2 wheels) Gait Pattern/deviations: Step-through pattern, Decreased stride length, Leaning posteriorly, Shuffle Gait velocity: dec Gait velocity interpretation: <1.31 ft/sec, indicative of household ambulator   General Gait Details: Cues for forward gaze, anterior weight shift onto RW lightly, walks with majority of weight on heels causing posteior instability. Supervision for safety. Perturbed by horizontal head turns. Reliant on RW for support. SpO2 85% on RA, 91% on 2L. Moderate dyspnea.  Stairs            Wheelchair Mobility     Tilt Bed    Modified [place] (Stroke Patients Only)       Balance Overall balance assessment: Needs assistance Sitting-balance support: No upper extremity supported, Feet supported Sitting balance-Leahy Scale: Fair     Standing balance support: Bilateral upper extremity supported Standing balance-Leahy Scale: Poor                               Pertinent Vitals/Pain  Pain Assessment Pain Assessment: No/denies pain    Home Living Family/patient expects to be discharged to:: Private residence Living Arrangements: Alone Available Help at Discharge: Other (Comment) (Staff at ILF) Type of Home: Independent living facility [facility] [place]) Home Access: Level entry       Home  Layout: One level Home Equipment: Agricultural consultant (2 wheels);Cane - single point;Shower seat;Grab bars - tub/shower;Grab bars - toilet      Prior Function Prior Level of Function : Independent/Modified Independent;History of Falls (last six months)             Mobility Comments: Reports multiple falls. Does not use SPC or RW in doors, but will take her cane on trips outdoors, goes on outings with ILF. Falls have been indoors while not using AD. ADLs Comments: Ind, goes to dining hall once per day, otherwise makes her own meals. Attends exercise classes regularly at ILF.     Extremity/Trunk Assessment   Upper Extremity Assessment Upper Extremity Assessment: Defer to OT evaluation    Lower Extremity Assessment Lower Extremity Assessment: Generalized weakness       Communication   Communication Communication: No apparent difficulties    Cognition Arousal: Alert Behavior During Therapy: WFL for tasks assessed/performed   PT - Cognitive impairments: No apparent impairments                       PT - Cognition Comments: Hyperverbose Following commands: Intact       Cueing Cueing Techniques: Verbal cues, Gestural cues     General Comments General comments (skin integrity, edema, etc.): SpO2 at rest on RA 95%, 86% on RA.    Exercises     Assessment/Plan    PT Assessment Patient needs continued PT services  PT Problem List Decreased strength;Decreased activity tolerance;Decreased balance;Decreased mobility;Decreased range of motion;Decreased knowledge of use of DME;Decreased safety awareness;Cardiopulmonary status limiting activity       PT Treatment Interventions DME instruction;Gait training;Functional mobility training;Therapeutic activities;Balance training;Neuromuscular re-education;Patient/family education    PT Goals (Current goals can be found in the Care Plan section)  Acute Rehab PT Goals Patient Stated Goal: Go home PT Goal Formulation: With  patient Time For Goal Achievement: 08/05/24 Potential to Achieve Goals: Good    Frequency Min 2X/week     Co-evaluation               AM-PAC PT 6 Clicks Mobility  Outcome Measure Help needed turning from your back to your side while in a flat bed without using bedrails?: None Help needed moving from lying on your back to sitting on the side of a flat bed without using bedrails?: A Little Help needed moving to and from a bed to a chair (including a wheelchair)?: A Little Help needed standing up from a chair using your arms (e.g., wheelchair or bedside chair)?: A Little Help needed to walk in hospital room?: A Little Help needed climbing 3-5 steps with a railing? : A Little 6 Click Score: 19    End of Session Equipment Utilized During Treatment: Gait belt Activity Tolerance: Patient tolerated treatment well Patient left: in chair;with call bell/phone within reach;with chair alarm set Nurse Communication: Mobility status PT Visit Diagnosis: Unsteadiness on feet (R26.81);Other abnormalities of gait and mobility (R26.89);Repeated falls (R29.6);Muscle weakness (generalized) (M62.81);History of falling (Z91.81);Difficulty in walking, not elsewhere classified (R26.2)    Time: 9159-9082 PT Time Calculation (min) (ACUTE ONLY): 37 min   Charges:   PT Evaluation $PT Eval Low Complexity:  1 Low PT Treatments $Gait Training: 8-22 mins PT General Charges $$ ACUTE PT VISIT: 1 Visit         Leontine Roads, PT, DPT [facility]  Rehabilitation Services Physical Therapist Office: [phone] Website: [email or web address]  Leontine GORMAN Roads 07/22/2024, 9:49 AM

## 2024-07-22 NOTE — Consult Note (Signed)
 Nephrology Consult   Requesting provider: Dorinda Drue DASEN, MD  Service requesting consult: Hospitalist Reason for consult: AKI   Assessment/Recommendations: Cheryl Peterson is a/an 89 y.o. female with a past medical history notable for AKI    Reported Anuric AKI (worsening): Likely secondary to Prerenal/early ATN.  Presented with relatively normal creatinine and significantly elevated blood pressures.  Blood pressures were quickly normalized, and worry that could contribute to possible early ATN.  She was producing adequate UOP with IV Lasix , with 3 kg decrease in weight. -Per patient, symptomatically improved. -Continue to monitor daily Cr, Dose meds for GFR<15 -Monitor Daily I/Os, Daily weight  -May obtain urine sample for sediment analysis for AKI, if able -Would place Foley for strict I's/O -Maintain MAP>65 for optimal renal perfusion.  -Agree with holding ACE-I, avoid further nephrotoxins including NSAIDS, Morphine .  Unless absolutely necessary, avoid CT with contrast and/or MRI with gadolinium.    -Check Renal U/S to rule out obstruction -Currently no indication for HD  Volume Status: Appears hyper/euvolemic on exam. Based on our examination and review of available imaging, our recommendation is would hold afternoon Lasix  and reassess in the morning.SABRA  Hypertension:  Electrolytes: K+ 3.9, Na+ 127.  Hyponatremia, worsening: Would obtained Posm, Uosm, Una.  Monitor for now.  Medical Problem - Paroxysmal atrial flutter - AoC HFpEF   Recommendations conveyed to primary service.    [name] HERO Nicolette Gieske [facility] 07/22/2024 3:09 PM   _____________________________________________________________________________________   History of Present Illness: Cheryl Peterson is a/an 89 y.o. female with a past medical history of CKD 3, HTN, history of a flutter, pAF on Eliquis  who presents to [facility] with SOB.  Symptoms worsened over the few weeks prior to presenting.  In the ED,  initial vitals 119/77, SpO2 93%.  Initial labs creatinine 1.06, sodium 131.  CXR with vascular congestion and diffuse interstitial opacity suggestive of interstitial edema.  Received IV Lasix  40, IV hydralazine  10 mg.  Repeat BP 100/47.  They received additional IV Lasix  40.   Nadir BP 93/45 at 5 AM today.  Received another IV 40 Lasix  this morning. 8/25 AM labs: Sodium 127, BUN 31, creatinine 2.61.  UOP 800 mL as of this morning. This afternoon UOP 0.  Seen in bed.  Attempted to obtain story prior to arrival, however she needed some redirecting.  Per recent cardiology note, she was prescribed Lopressor  25 twice daily.  Per patient, she was not requiring blood pressure medications.  She thought she was urinating with some overflow over the external catheter.  However the nurse stated this had not occurred.   Medications:  Current Facility-Administered Medications  Medication Dose Route Frequency Provider Last Rate Last Admin   acetaminophen  (TYLENOL ) tablet 650 mg  650 mg Oral Q6H PRN Patel, Ekta V, MD       Or   acetaminophen  (TYLENOL ) suppository 650 mg  650 mg Rectal Q6H PRN Patel, Ekta V, MD       apixaban  (ELIQUIS ) tablet 5 mg  5 mg Oral BID Patel, Ekta V, MD   5 mg at 07/22/24 1002   atorvastatin  (LIPITOR) tablet 10 mg  10 mg Oral Daily Patel, Ekta V, MD   10 mg at 07/22/24 1002   furosemide  (LASIX ) injection 40 mg  40 mg Intravenous BID Patel, Ekta V, MD   40 mg at 07/22/24 0848   hydrALAZINE  (APRESOLINE ) injection 10 mg  10 mg Intravenous Q6H PRN Patel, Ekta V, MD   10 mg at 07/21/24  1757   levothyroxine  (SYNTHROID ) tablet 50 mcg  50 mcg Oral QAC breakfast Tobie Mario GAILS, MD   50 mcg at 07/22/24 9372   metoprolol  tartrate (LOPRESSOR ) tablet 25 mg  25 mg Oral BID Tobie Mario GAILS, MD       ondansetron  (ZOFRAN ) injection 4 mg  4 mg Intravenous Q6H PRN Patel, Ekta V, MD   4 mg at 07/21/24 1856   pantoprazole  (PROTONIX ) injection 40 mg  40 mg Intravenous Q12H Patel, Ekta V, MD   40 mg at  07/22/24 1004   PARoxetine  (PAXIL ) tablet 20 mg  20 mg Oral Daily Tobie Mario V, MD   20 mg at 07/22/24 1002   sodium chloride  flush (NS) 0.9 % injection 3 mL  3 mL Intravenous Q12H Tobie Mario GAILS, MD   3 mL at 07/22/24 1006     ALLERGIES Penicillin g sodium  MEDICAL HISTORY Past Medical History:  Diagnosis Date   A-fib [facility])    Chest pain    Decreased diffusion capacity    Hyperlipidemia    Hypertension    SOB (shortness of breath)      SOCIAL HISTORY Social History   Socioeconomic History   Marital status: Widowed    Spouse name: Not on file   Number of children: Not on file   Years of education: Not on file   Highest education level: Not on file  Occupational History   Not on file  Tobacco Use   Smoking status: Former    Current packs/day: 0.00    Types: Cigarettes    Quit date: 11/28/1978    Years since quitting: 45.6   Smokeless tobacco: Never  Substance and Sexual Activity   Alcohol  use: No   Drug use: No   Sexual activity: Not on file  Other Topics Concern   Not on file  Social History Narrative   Not on file   Social Drivers of Health   Financial Resource Strain: Not on file  Food Insecurity: No Food Insecurity (07/22/2024)   Hunger Vital Sign    Worried About Running Out of Food in the Last Year: Never true    Ran Out of Food in the Last Year: Never true  Transportation Needs: No Transportation Needs (07/22/2024)   PRAPARE - Administrator, Civil Service (Medical): No    Lack of Transportation (Non-Medical): No  Physical Activity: Not on file  Stress: Not on file  Social Connections: Unknown (07/22/2024)   Social Connection and Isolation Panel    Frequency of Communication with Friends and Family: More than three times a week    Frequency of Social Gatherings with Friends and Family: Three times a week    Attends Religious Services: Patient declined    Active Member of Clubs or Organizations: Yes    Attends Banker Meetings:  More than 4 times per year    Marital Status: Not on file  Intimate Partner Violence: Not At Risk (07/22/2024)   Humiliation, Afraid, Rape, and Kick questionnaire    Fear of Current or Ex-Partner: No    Emotionally Abused: No    Physically Abused: No    Sexually Abused: No     FAMILY HISTORY Family History  Problem Relation Age of Onset   Stroke Mother    Hypertension Mother    Deep vein thrombosis Father    Clotting disorder Sister    Heart disease Sister    Heart failure Sister    Clotting disorder Brother  Heart disease Brother    Heart failure Brother    Colon cancer Neg Hx      No family history of kidney disease  Review of Systems: 12 systems reviewed Otherwise as per HPI, all other systems reviewed and negative  Physical Exam: Vitals:   07/22/24 0734 07/22/24 1230  BP: (!) 111/54 (!) 116/50  Pulse:    Resp: 16 17  Temp: 99 F (37.2 C) 98 F (36.7 C)  SpO2:  96%   No intake/output data recorded.  Intake/Output Summary (Last 24 hours) at 07/22/2024 1509 Last data filed at 07/22/2024 0557 Gross per 24 hour  Intake 3 ml  Output 800 ml  Net -797 ml   General: well-appearing, no acute distress HEENT: anicteric sclera, oropharynx clear without lesions CV: regular rate, normal rhythm, no murmurs, no gallops, no rubs, trace no peripheral edema Lungs: BLBS Abd: soft, non-tender, non-distended Psych: alert, engaged, appropriate mood and affect Musculoskeletal: no obvious deformities Neuro: normal speech, no gross focal deficits   Test Results Reviewed Lab Results  Component Value Date   NA 127 (L) 07/22/2024   K 3.9 07/22/2024   CL 90 (L) 07/22/2024   CO2 24 07/22/2024   BUN 31 (H) 07/22/2024   CREATININE 2.61 (H) 07/22/2024   CALCIUM  8.6 (L) 07/22/2024   ALBUMIN 3.1 (L) 07/21/2024     I have reviewed all relevant outside healthcare records related to the patient's kidney injury.

## 2024-07-22 NOTE — Progress Notes (Signed)
 Echocardiogram 2D Echocardiogram has been performed.  Cheryl Peterson 07/22/2024, 11:15 AM

## 2024-07-22 NOTE — Evaluation (Signed)
 Occupational Therapy Evaluation Patient Details Name: Cheryl Peterson MRN: [id number] DOB: [birth year] Today's Date: 07/22/2024   History of Present Illness   89 yo female admitted 8/24 with SOB Chest xray enlargement of cardiopericardial silhouette vascular congestion and diffuse interstitial opacity suggesting interstitial edema.(+) PMH Afib HLD HTN, thoracentesis 2022 depression on Paxil  CKD III.     Clinical Impressions PT admitted with SOB. Pt currently with functional limitiations due to the deficits listed below (see OT problem list). Pt at this time noted to have lasix  but has not voided all day. Pt with transfer to the commode voiding bladder with small amount without pt having any urgency. Pt reports previously having to get up multiple times in the night at baseline. Pt noted to have decr 02 RA 83%. Concerns for line management using DME ( rollator) in apartment upon d/c home alone. Pt encouraged to ask the facility if they offer respite care as part of her benefits for any duration of time. Pt educated of fall risk.  Pt will benefit from skilled OT to increase their independence and safety with adls and balance to allow discharge skilled inpatient follow up therapy, <3 hours/day. Pt likely to want to d/c with home services with decreased awareness to fall risk.      If plan is discharge home, recommend the following:   A little help with walking and/or transfers;A little help with bathing/dressing/bathroom     Functional Status Assessment   Patient has had a recent decline in their functional status and demonstrates the ability to make significant improvements in function in a reasonable and predictable amount of time.     Equipment Recommendations   Other (comment) (rollator oxygen)     Recommendations for Other Services         Precautions/Restrictions   Precautions Precautions: Fall Recall of Precautions/Restrictions: Intact Precaution/Restrictions  Comments: monitor O2 Restrictions Weight Bearing Restrictions Per Provider Order: No     Mobility Bed Mobility               General bed mobility comments: EOB eating on arrival    Transfers Overall transfer level: Needs assistance Equipment used: Rolling walker (2 wheels) Transfers: Sit to/from Stand Sit to Stand: Min assist           General transfer comment: posterior lean with initial standing. pt reports i always want to go backward      Balance Overall balance assessment: Needs assistance Sitting-balance support: No upper extremity supported, Feet supported Sitting balance-Leahy Scale: Fair     Standing balance support: Bilateral upper extremity supported Standing balance-Leahy Scale: Poor                             ADL either performed or assessed with clinical judgement   ADL Overall ADL's : Needs assistance/impaired Eating/Feeding: Modified independent   Grooming: Contact guard assist;Standing;Wash/dry face Grooming Details (indicate cue type and reason): leaning on sink surface             Lower Body Dressing: Contact guard assist Lower Body Dressing Details (indicate cue type and reason): pt is able to figure 4 cross Toilet Transfer: Minimal assistance;Regular Toilet;Rolling walker (2 wheels);Grab bars Toilet Transfer Details (indicate cue type and reason): decreased 02 83% no symptoms and HR 79 Toileting- Clothing Manipulation and Hygiene: Contact guard assist;Sitting/lateral lean       Functional mobility during ADLs: Minimal assistance;Rolling walker (2 wheels) General ADL Comments: pt requires  increased (A) with tighter spaces and no awareness to oxygen deficits     Vision Baseline Vision/History: 1 Wears glasses Patient Visual Report: No change from baseline       Perception         Praxis         Pertinent Vitals/Pain Pain Assessment Pain Assessment: No/denies pain     Extremity/Trunk Assessment Upper  Extremity Assessment Upper Extremity Assessment: Overall WFL for tasks assessed   Lower Extremity Assessment Lower Extremity Assessment: Defer to PT evaluation   Cervical / Trunk Assessment Cervical / Trunk Assessment: Kyphotic   Communication Communication Communication: No apparent difficulties   Cognition Arousal: Alert Behavior During Therapy: WFL for tasks assessed/performed Cognition: Cognition impaired           Executive functioning impairment (select all impairments): Reasoning OT - Cognition Comments: pt reports very unsteady throughout session but also reports not wanting an aide with her for safety. pt states i can't fall we can't have that happen but believes having a portable tank for oxygen will make her not fall                 Following commands: Intact       Cueing  General Comments   Cueing Techniques: Verbal cues;Gestural cues  83% RA with oxygen increased to 91%. pt with excellent return demo of pursed lip breathing   Exercises     Shoulder Instructions      Home Living Family/patient expects to be discharged to:: Private residence Living Arrangements: Alone Available Help at Discharge: Other (Comment) (Staff at ILF) Type of Home: Independent living facility [facility] [place]) Home Access: Level entry     Home Layout: One level     Bathroom Shower/Tub: Producer, television/film/video: Standard Bathroom Accessibility: Yes   Home Equipment: Agricultural consultant (2 wheels);Cane - single point;Shower seat;Grab bars - tub/shower;Grab bars - toilet   Additional Comments: reports x2 falls - one in the hall backward and one in the laundry room. pt reports not eating breakfast, gets up to exercise at 9:30 and then eats lunch from the facilty, pt fixes premade sauage sandwich or ensure drink for dinner. pt reports getting up frequently at night to void bladder      Prior Functioning/Environment Prior Level of Function : Independent/Modified  Independent;History of Falls (last six months)             Mobility Comments: Reports multiple falls. Does not use SPC or RW in doors, but will take her cane on trips outdoors, goes on outings with ILF. Falls have been indoors while not using AD. ADLs Comments: Ind, goes to dining hall once per day, otherwise makes her own meals. Attends exercise classes regularly at ILF.    OT Problem List: Decreased activity tolerance;Impaired balance (sitting and/or standing);Decreased safety awareness;Decreased knowledge of use of DME or AE;Decreased knowledge of precautions;Cardiopulmonary status limiting activity   OT Treatment/Interventions: Self-care/ADL training;Therapeutic exercise;Energy conservation;DME and/or AE instruction;Therapeutic activities;Balance training;Patient/family education      OT Goals(Current goals can be found in the care plan section)   Acute Rehab OT Goals Patient Stated Goal: to go home . i plan to die in that same room. i am not moving OT Goal Formulation: With patient/family Time For Goal Achievement: 08/05/24 Potential to Achieve Goals: Good   OT Frequency:  Min 2X/week    Co-evaluation              AM-PAC OT 6 Clicks Daily Activity  Outcome Measure Help from another person eating meals?: None Help from another person taking care of personal grooming?: A Little Help from another person toileting, which includes using toliet, bedpan, or urinal?: A Little Help from another person bathing (including washing, rinsing, drying)?: A Little Help from another person to put on and taking off regular upper body clothing?: None Help from another person to put on and taking off regular lower body clothing?: A Little 6 Click Score: 20   End of Session Equipment Utilized During Treatment: Rolling walker (2 wheels);Oxygen Nurse Communication: Mobility status;Precautions  Activity Tolerance: Patient tolerated treatment well Patient left: in chair;with call  bell/phone within reach;with bed alarm set;Other (comment) (MD and lab in room)  OT Visit Diagnosis: Unsteadiness on feet (R26.81);Muscle weakness (generalized) (M62.81)                Time: 8795-8750 OT Time Calculation (min): 45 min Charges:  OT General Charges $OT Visit: 1 Visit OT Evaluation $OT Eval Moderate Complexity: 1 Mod OT Treatments $Self Care/Home Management : 23-37 mins   Brynn, OTR/L  Acute Rehabilitation Services Office: [phone] .   Ely Molt 07/22/2024, 2:09 PM

## 2024-07-22 NOTE — Progress Notes (Signed)
 Mobility Specialist Progress Note;   07/22/24 1402  Mobility  Activity Pivoted/transferred from chair to bed  Level of Assistance Contact guard assist, steadying assist  Assistive Device Front wheel walker  Distance Ambulated (ft) 5 ft  Activity Response Tolerated well  Mobility Referral Yes  Mobility visit 1 Mobility  Mobility Specialist Start Time (ACUTE ONLY) 1402  Mobility Specialist Stop Time (ACUTE ONLY) 1418  Mobility Specialist Time Calculation (min) (ACUTE ONLY) 16 min   Pt requesting assistance back to bed. Required MinG assistance during ambulation for safety. VSS on 2LO2. Requested assistance to [facility] as well, small urine output. Pt left comfortably in bed with all needs met, alarm on.   Lauraine Erm Mobility Specialist Please contact via SecureChat or [employer] [phone]

## 2024-07-22 NOTE — Discharge Instructions (Signed)

## 2024-07-22 NOTE — Progress Notes (Signed)
 Progress Note   Patient: Cheryl Peterson FMW:[id number] DOB: 1930-04-06 DOA: 07/21/2024     0 DOS: the patient was seen and examined on 07/22/2024   Brief hospital course: Cheryl Peterson is a 89 y.o. female with past medical history  of depression on Paxil , history of hyperlipidemia atorvastatin , essential hypertension metoprolol , CKD stage III, history of UTI, atrial flutter history, and paroxysmal A-fib on Eliquis , hypothyroidism, presenting today with shortness of breath.  Overall symptoms have been going on for the past few weeks but has been progressively getting worse.  Sats on initial presentation was 87% on room air patient was found to be in respiratory distress and placed on a nonrebreather.  Patient has been followed closely by cardiology and is currently wearing a Zio patch.  At bedside she is alert awake oriented gives brief history she is dyspneic and hypoxic.     Assessment and Plan: Acute hypoxic respiratory failure secondary to diastolic CHF exacerbation Cardiologist on board we appreciate input Echocardiogram showing EF of 70% to 75% with indeterminate diastolic parameters Neurologist have recommended holding this evening dose of Lasix  given worsening renal function We will avoid hypotension Continue strict I's and O's Daily weights. Monitoring of kidney function and electrolytes. Low-sodium diet, heart healthy diet  Supplemental oxygen for goal O2 sats above 90%.  Acute renal failure likely secondary to diuretic use as well as hypotension Nephrologist on board and case discussed Avoid nephrotoxic medications Renally dose all drugs Monitor renal function   Hypothyroidism: Continue patient on levothyroxine    Mildly abnormal LFTs: Attribute to passive hepatic congestion from her acute heart failure   Paroxysmal atrial flutter Continue Eliquis    DVT prophylaxis:  Eliquis   Consults:  Cardiology   Advance Care Planning:    Code Status: Full Code    Family  Communication:  None Disposition Plan:   Subjective:  Patient seen and examined at bedside this morning in the presence of patient's niece While was working with occupational therapist oxygen dropped to 83% Currently on 3 L of intranasal oxygen and she uses none at home   Physical Exam:  Constitutional:      General: She is not in acute distress.    Appearance: She is ill-appearing.  HENT:     Head: Normocephalic.  Eyes:     Extraocular Movements: Extraocular movements intact.  Cardiovascular:     Rate and Rhythm: Normal rate and regular rhythm.     Heart sounds: Normal heart sounds.  Pulmonary:     Effort: Tachypnea present.     Breath sounds: Decreased especially at the bases by laterally with rales present.  Abdominal:     General: There is no distension.     Palpations: Abdomen is soft.     Tenderness: There is no abdominal tenderness.  Neurological:     General: No focal deficit present.     Mental Status: She is alert and oriented to person, place, and time.     Vitals:   07/22/24 0734 07/22/24 1058 07/22/24 1230 07/22/24 1650  BP: (!) 111/54  (!) 116/50 (!) 96/44  Pulse:    (!) 56  Resp: 16  17 15   Temp: 99 F (37.2 C)  98 F (36.7 C) 98 F (36.7 C)  TempSrc: Oral Oral Oral Oral  SpO2:   96% 97%  Weight:      Height:        Data Reviewed: Chest x-ray reviewed showing bilateral pulmonary vascular congestion    Latest Ref Rng &  Units 07/21/2024   12:21 PM 07/19/2024   11:11 AM 01/03/2022   10:24 AM  CBC  WBC 4.0 - 10.5 K/uL 8.7  9.1  8.8   Hemoglobin 12.0 - 15.0 g/dL 87.5  86.6  87.3   Hematocrit 36.0 - 46.0 % 36.9  39.1  38.1   Platelets 150 - 400 K/uL 255  264  327        Latest Ref Rng & Units 07/22/2024   12:48 PM 07/21/2024   12:21 PM 07/19/2024   11:11 AM  BMP  Glucose 70 - 99 mg/dL 826  814  896   BUN 8 - 23 mg/dL 31  21  20    Creatinine 0.44 - 1.00 mg/dL 7.38  8.93  8.81   BUN/Creat Ratio 12 - 28   17   Sodium 135 - 145 mmol/L 127  131   139   Potassium 3.5 - 5.1 mmol/L 3.9  4.8  5.2   Chloride 98 - 111 mmol/L 90  99  99   CO2 22 - 32 mmol/L 24  24  25    Calcium  8.9 - 10.3 mg/dL 8.6  8.8  89.9      Family Communication: Discussed with patient's niece present at bedside  Disposition: Status is: Inpatient   Planned Discharge Destination: Pending medical stabilization  Time spent: 52 minutes  Author: Drue ONEIDA Potter, MD 07/22/2024 5:01 PM  For on call review [email or web address].

## 2024-07-23 ENCOUNTER — Encounter (HOSPITAL_COMMUNITY): Payer: Self-pay | Admitting: Internal Medicine

## 2024-07-23 DIAGNOSIS — R0602 Shortness of breath: Secondary | ICD-10-CM | POA: Diagnosis not present

## 2024-07-23 LAB — CBC
HCT: 32.2 % — ABNORMAL LOW (ref 36.0–46.0)
Hemoglobin: 10.7 g/dL — ABNORMAL LOW (ref 12.0–15.0)
MCH: 30.6 pg (ref 26.0–34.0)
MCHC: 33.2 g/dL (ref 30.0–36.0)
MCV: 92 fL (ref 80.0–100.0)
Platelets: 213 K/uL (ref 150–400)
RBC: 3.5 MIL/uL — ABNORMAL LOW (ref 3.87–5.11)
RDW: 13.3 % (ref 11.5–15.5)
WBC: 9.2 K/uL (ref 4.0–10.5)
nRBC: 0 % (ref 0.0–0.2)

## 2024-07-23 LAB — BASIC METABOLIC PANEL WITH GFR
Anion gap: 12 (ref 5–15)
BUN: 35 mg/dL — ABNORMAL HIGH (ref 8–23)
CO2: 25 mmol/L (ref 22–32)
Calcium: 8.2 mg/dL — ABNORMAL LOW (ref 8.9–10.3)
Chloride: 88 mmol/L — ABNORMAL LOW (ref 98–111)
Creatinine, Ser: 2.44 mg/dL — ABNORMAL HIGH (ref 0.44–1.00)
GFR, Estimated: 18 mL/min — ABNORMAL LOW (ref >60)
Glucose, Bld: 114 mg/dL — ABNORMAL HIGH (ref 70–99)
Potassium: 3.6 mmol/L (ref 3.5–5.1)
Sodium: 125 mmol/L — ABNORMAL LOW (ref 135–145)

## 2024-07-23 LAB — OSMOLALITY, URINE: Osmolality, Ur: 315 mosm/kg (ref 300–900)

## 2024-07-23 LAB — SODIUM, URINE, RANDOM: Sodium, Ur: <30 mmol/L

## 2024-07-23 LAB — OSMOLALITY: Osmolality: 280 mosm/kg (ref 275–295)

## 2024-07-23 MED ORDER — APIXABAN 2.5 MG PO TABS
2.5000 mg | ORAL_TABLET | Freq: Two times a day (BID) | ORAL | Status: DC
  Administered 2024-07-23 – 2024-07-27 (×8): 2.5 mg via ORAL
  Filled 2024-07-23 (×8): qty 1

## 2024-07-23 MED ORDER — PANTOPRAZOLE SODIUM 40 MG PO TBEC
40.0000 mg | DELAYED_RELEASE_TABLET | Freq: Two times a day (BID) | ORAL | Status: DC
  Administered 2024-07-23 – 2024-07-27 (×8): 40 mg via ORAL
  Filled 2024-07-23 (×8): qty 1

## 2024-07-23 NOTE — Progress Notes (Signed)
 Heart Failure Navigator Progress Note  Assessed for Heart & Vascular TOC clinic readiness.  Patient does not meet criteria due to EF 70-75%, Has a scheduled [facility] appointment 08/02/2024. No HF TOC. .   Navigator will sign off at this time.   Stephane Haddock, BSN, Scientist, clinical (histocompatibility and immunogenetics) Only

## 2024-07-23 NOTE — Plan of Care (Signed)

## 2024-07-23 NOTE — Progress Notes (Addendum)
 Progress Note   Patient: Cheryl Peterson FMW:[id number] DOB: [birth year] DOA: 07/21/2024     1 DOS: the patient was seen and examined on 07/23/2024     Brief hospital course: GENEE [name] is a 89 y.o. female with past medical history  of depression on Paxil , history of hyperlipidemia atorvastatin , essential hypertension metoprolol , CKD stage III, history of UTI, atrial flutter history, and paroxysmal A-fib on Eliquis , hypothyroidism, presenting today with shortness of breath.  Overall symptoms have been going on for the past few weeks but has been progressively getting worse.  Sats on initial presentation was 87% on room air patient was found to be in respiratory distress and placed on a nonrebreather.  Patient has been followed closely by cardiology and is currently wearing a Zio patch.  At bedside she is alert awake oriented gives brief history she is dyspneic and hypoxic.       Assessment and Plan: Acute hypoxic respiratory failure secondary to diastolic CHF exacerbation Cardiologist on board we appreciate input Echocardiogram showing EF of 70% to 75% with indeterminate diastolic parameters I have discussed with cardiologist as well as nephrologist and we will continue to hold diuresis for now in the setting of worsening renal function Continue strict I's and O's Daily weights. Monitoring of kidney function and electrolytes. Low-sodium diet, heart healthy diet  Patient desaturated to 83% when working with physical therapist on 07/22/2024 Supplemental oxygen for goal O2 sats above 90%. Continue metoprolol  May need a walk test at discharge   Acute renal failure likely secondary to diuretic use as well as hypotension Nephrologist on board and case discussed Avoid nephrotoxic medications Renally dose all drugs Monitor renal function  Hyponatremia Nephrologist on board Follow-up on plasma osmole, urine osmole as well as urine sodium.   Hypothyroidism: Continue patient on  levothyroxine      Mildly abnormal LFTs: Attribute to passive hepatic congestion from her acute heart failure   Paroxysmal atrial flutter Continue Eliquis    DVT prophylaxis:  Eliquis    Consults:  Cardiology, nephrology   Advance Care Planning:    Code Status: Full Code    Family Communication: Discussed with patient's niece  Disposition Plan: Pending medical stabilization   Subjective:  Patient seen and examined at bedside this morning  Respiratory function improved than yesterday Currently on 2 L of intranasal oxygen and she uses none at home     Physical Exam:   Constitutional:      General: She is not in acute distress.    Appearance: She is ill-appearing.  HENT:     Head: Normocephalic.  Eyes:     Extraocular Movements: Extraocular movements intact.  Cardiovascular:     Rate and Rhythm: Normal rate and regular rhythm.     Heart sounds: Normal heart sounds.  Pulmonary: Decreased air entry bibasilarly Abdominal:     General: There is no distension.     Palpations: Abdomen is soft.     Tenderness: There is no abdominal tenderness.  Neurological:     General: No focal deficit present.     Mental Status: She is alert and oriented to person, place, and time.   Data Reviewed:     Latest Ref Rng & Units 07/23/2024    2:24 AM 07/22/2024   12:48 PM 07/21/2024   12:21 PM  BMP  Glucose 70 - 99 mg/dL 885  826  814   BUN 8 - 23 mg/dL 35  31  21   Creatinine 0.44 - 1.00 mg/dL 7.55  2.61  1.06   Sodium 135 - 145 mmol/L 125  127  131   Potassium 3.5 - 5.1 mmol/L 3.6  3.9  4.8   Chloride 98 - 111 mmol/L 88  90  99   CO2 22 - 32 mmol/L 25  24  24    Calcium  8.9 - 10.3 mg/dL 8.2  8.6  8.8        Latest Ref Rng & Units 07/23/2024    2:24 AM 07/21/2024   12:21 PM 07/19/2024   11:11 AM  CBC  WBC 4.0 - 10.5 K/uL 9.2  8.7  9.1   Hemoglobin 12.0 - 15.0 g/dL 89.2  87.5  86.6   Hematocrit 36.0 - 46.0 % 32.2  36.9  39.1   Platelets 150 - 400 K/uL 213  255  264       Vitals:   07/23/24 0727 07/23/24 1100 07/23/24 1107 07/23/24 1133  BP: 127/60   (!) 140/69  Pulse: 64  (!) 48 62  Resp: 18   18  Temp: 98 F (36.7 C)   98 F (36.7 C)  TempSrc: Oral   Oral  SpO2: 100% 90% 97% 96%  Weight:      Height:        Time spent: 43 minutes  Author: Drue ONEIDA Potter, MD 07/23/2024 5:03 PM  For on call review [email or web address].

## 2024-07-23 NOTE — Progress Notes (Signed)
 Physical Therapy Treatment Patient Details Name: Cheryl Peterson MRN: [id number] DOB: [birth year] Today's Date: 07/23/2024   History of Present Illness 89 yo female admitted 8/24 with SOB Chest xray enlargement of cardiopericardial silhouette vascular congestion and diffuse interstitial opacity suggesting interstitial edema.(+) PMH Afib HLD HTN, thoracentesis 2022 depression on Paxil  CKD III.    PT Comments  Pt received in chair, son present, pt agreeable to therapy session, asking who her RN is (pt reoriented to board in room), pt tangential but cooperative. Pt unfamiliar with rollator DME and needed reminders for use of brakes/safe UE placement with each transfer, with decreased carryover of verbal discussion for brakes prior to sitting. Pt with fair awareness of O2 lines this date, and improved stability with use of rollator for gait trial for household distance in hallway. SpO2 90% and above on 2L O2 [place], improved to >92% once on 3L/min O2 [place]. Pt RN notified pt's son has questions regarding her pulmonary edema with relevance to a prior admission/readmission in the past. Pt continues to benefit from PT services to progress toward functional mobility goals, recommend post-acute PT at her ILF to increase their independence and safety with mobility to allow discharge, discussed with supervising PT Logan B per pt progress.   If plan is discharge home, recommend the following: A little help with bathing/dressing/bathroom;Assistance with cooking/housework;Assist for transportation   Can travel by private vehicle     Yes  Equipment Recommendations  Rollator (4 wheels)    Recommendations for Other Services       Precautions / Restrictions Precautions Precautions: Fall Recall of Precautions/Restrictions: Intact Precaution/Restrictions Comments: monitor O2 Restrictions Weight Bearing Restrictions Per Provider Order: No     Mobility  Bed Mobility               General bed mobility  comments: pt received in chair    Transfers Overall transfer level: Needs assistance Equipment used: Rollator (4 wheels) Transfers: Sit to/from Stand Sit to Stand: Supervision           General transfer comment: Cues prior to standing and again prior to sitting to recall safe UE placement and use of brakes with unfamiliar device (rollator)    Ambulation/Gait Ambulation/Gait assistance: Supervision Gait Distance (Feet): 125 Feet Assistive device: Rollator (4 wheels) Gait Pattern/deviations: Step-through pattern, Decreased stride length, Shuffle, Narrow base of support   Gait velocity interpretation: <1.31 ft/sec, indicative of household ambulator   General Gait Details: SpO2 90% and above on 2L/min O2 [place], HR 48 bpm seated and ~60's bpm with gait trial. No overt LOB, pt given min cues to recall room # then able to wayfind without assist upon return to her room. Min cues for improved stride length with some improvement, but still takes shorter steps.   Stairs             Wheelchair Mobility     Tilt Bed    Modified [place] (Stroke Patients Only)       Balance Overall balance assessment: Needs assistance Sitting-balance support: No upper extremity supported, Feet supported Sitting balance-Leahy Scale: Fair     Standing balance support: Bilateral upper extremity supported, During functional activity, Reliant on assistive device for balance Standing balance-Leahy Scale: Poor Standing balance comment: posterior instability upon standing but no LOB using 4WW                            Communication Communication Communication: No apparent difficulties  Cognition  Arousal: Alert Behavior During Therapy: WFL for tasks assessed/performed   PT - Cognitive impairments: Safety/Judgement, Memory                       PT - Cognition Comments: Mildly decreased safety with O2 line awareness and transfers, but also unfamiliar with rollator DME, when pt  prompted prior to sitting, she is able to verbalize I need to set the brakes but then attempting to sit prior to locking brakes. Pt cued to stop and place brakes, but also needs reminders to reach back for arm rest prior to sitting. Pt states she thought Brynn (OT who saw her earlier) was her RN, pt reoriented to board in her room to check for RN/NT names.  Following commands: Intact       Cueing Cueing Techniques: Verbal cues, Gestural cues  Exercises      General Comments General comments (skin integrity, edema, etc.): 90-91% on 2L/min O2 [place] with amb; SpO2 97% on 3L O2  at rest.      Pertinent Vitals/Pain Pain Assessment Pain Assessment: No/denies pain    Home Living Family/patient expects to be discharged to:: Private residence                        Prior Function            PT Goals (current goals can now be found in the care plan section) Acute Rehab PT Goals Patient Stated Goal: Go home PT Goal Formulation: With patient Time For Goal Achievement: 08/05/24 Progress towards PT goals: Progressing toward goals    Frequency    Min 2X/week      PT Plan      Co-evaluation              AM-PAC PT 6 Clicks Mobility   Outcome Measure  Help needed turning from your back to your side while in a flat bed without using bedrails?: None Help needed moving from lying on your back to sitting on the side of a flat bed without using bedrails?: A Little Help needed moving to and from a bed to a chair (including a wheelchair)?: A Little Help needed standing up from a chair using your arms (e.g., wheelchair or bedside chair)?: A Little Help needed to walk in hospital room?: A Little Help needed climbing 3-5 steps with a railing? : A Little 6 Click Score: 19    End of Session Equipment Utilized During Treatment: Gait belt;Oxygen Activity Tolerance: Patient tolerated treatment well Patient left: in chair;with call bell/phone within reach;with chair alarm  set;with family/visitor present (son in room) Nurse Communication: Mobility status;Other (comment) (son asking questions about pulm edema, RN notified) PT Visit Diagnosis: Unsteadiness on feet (R26.81);Other abnormalities of gait and mobility (R26.89);Repeated falls (R29.6);Muscle weakness (generalized) (M62.81);History of falling (Z91.81);Difficulty in walking, not elsewhere classified (R26.2)     Time: 8952-8892 PT Time Calculation (min) (ACUTE ONLY): 20 min  Charges:    $Gait Training: 8-22 mins PT General Charges $$ ACUTE PT VISIT: 1 Visit                     Esma Kilts P., PTA Acute Rehabilitation Services Secure Chat Preferred 9a-5:30pm Office: [phone]    Connell HERO [facility] 07/23/2024, 11:35 AM

## 2024-07-23 NOTE — TOC Progression Note (Signed)
 Transition of Care (TOC) - Progression Note   Received secure chat from PT, recommendation is back to HHPT/OT .   Orders faxed to [facility] at [place].   Secure chatted MD to sign order for Rollator.   If patient needs home oxygen will need ambulatory oxygen saturation note and order . NCM secure chatted team    Patient Details  Name: Cheryl Peterson MRN: [id number] Date of Birth: March 19, 1930  Transition of Care [facility]) CM/SW Contact  Tiffini Blacksher, Powell Jansky, RN Phone Number: 07/23/2024, 11:37 AM  Clinical Narrative:       Expected Discharge Plan: Home w Home Health Services Barriers to Discharge: Continued Medical Work up               Expected Discharge Plan and Services   Discharge Planning Services: CM Consult Post Acute Care Choice: Home Health, Durable Medical Equipment Living arrangements for the past 2 months: Independent Living Facility                 DME Arranged:  (see note)           HH Agency:  (see note)         Social Drivers of Health (SDOH) Interventions SDOH Screenings   Food Insecurity: No Food Insecurity (07/22/2024)  Housing: Low Risk  (07/22/2024)  Transportation Needs: No Transportation Needs (07/22/2024)  Utilities: Not At Risk (07/22/2024)  Social Connections: Unknown (07/23/2024)  Tobacco Use: Medium Risk (07/23/2024)    Readmission Risk Interventions    11/26/2021    3:23 PM  Readmission Risk Prevention Plan  Post Dischage Appt Complete  Medication Screening Complete  Transportation Screening Complete

## 2024-07-23 NOTE — Progress Notes (Signed)
 Occupational Therapy Treatment Patient Details Name: Cheryl Peterson MRN: [id number] DOB: 07/10/1930 Today's Date: 07/23/2024   History of present illness 89 yo female admitted 8/24 with SOB Chest xray enlargement of cardiopericardial silhouette vascular congestion and diffuse interstitial opacity suggesting interstitial edema.(+) PMH Afib HLD HTN, thoracentesis 2022 depression on Paxil  CKD III.   OT comments  Pt noted to have small nose bleed that is only visible if the patient uses tissue inside the nose. Pt is very concerned about the bleeding. RN contacted and providing a humidifier to help with nose. Pt states bleeding scaring her due to information given to her at a cardiologist appointment that she shouldn't bleed it could be dangerous. Pt continues to require (A) with balance, 3L [place] and education for safety with the RW. Recommendation for skilled inpatient follow up therapy, <3 hours/day. If family or facility can increase care could consider return to indep. Pt previously with private duty home aide.       If plan is discharge home, recommend the following:  A little help with walking and/or transfers;A little help with bathing/dressing/bathroom   Equipment Recommendations  Other (comment)    Recommendations for Other Services Rehab consult    Precautions / Restrictions Precautions Precautions: Fall Recall of Precautions/Restrictions: Intact Precaution/Restrictions Comments: monitor O2       Mobility Bed Mobility               General bed mobility comments: sitting EOB    Transfers Overall transfer level: Needs assistance Equipment used: Rolling walker (2 wheels) Transfers: Sit to/from Stand Sit to Stand: Contact guard assist           General transfer comment: pt requires x2 (A) to power up from bed surface     Balance Overall balance assessment: Needs assistance         Standing balance support: Bilateral upper extremity supported, During  functional activity, Reliant on assistive device for balance Standing balance-Leahy Scale: Poor                             ADL either performed or assessed with clinical judgement   ADL Overall ADL's : Needs assistance/impaired Eating/Feeding: Modified independent;Sitting   Grooming: Oral care;Wash/dry face;Wash/dry hands;Contact guard assist;Standing Grooming Details (indicate cue type and reason): pt needed help opening containers on sink surface. pt needed OT to clarify product ( soap, mouth wash, mesh panties vs wash cloth)                 Toilet Transfer: Minimal assistance Toilet Transfer Details (indicate cue type and reason): requires cues to take the RW Toileting- Clothing Manipulation and Hygiene: Minimal assistance Toileting - Clothing Manipulation Details (indicate cue type and reason): with mesh panties and pad     Functional mobility during ADLs: Minimal assistance;Rolling walker (2 wheels) General ADL Comments: pt needs help during session with managmeent of lines    Extremity/Trunk Assessment Upper Extremity Assessment Upper Extremity Assessment: Overall WFL for tasks assessed   Lower Extremity Assessment Lower Extremity Assessment: Overall WFL for tasks assessed        Vision   Vision Assessment?: Wears glasses for reading;Wears glasses for driving Additional Comments: wears glasses at all times and used this session without any change to balance noted   Perception     Praxis     Communication Communication Communication: No apparent difficulties   Cognition Arousal: Alert Behavior During Therapy: [facility] for tasks assessed/performed  Cognition: Cognition impaired           Executive functioning impairment (select all impairments): Reasoning OT - Cognition Comments: pt verbalizes my balance is worse. pt then abandons RW. OT cues for RW and pt states I dont need it. Ot redirects with do you think you are steady? Pt states NO this is  much worse. pt decreased awareness of line management with need for 3L [place] 02                 Following commands: Intact        Cueing   Cueing Techniques: Verbal cues, Visual cues  Exercises      Shoulder Instructions       General Comments 96% on 3L    Pertinent Vitals/ Pain       Pain Assessment Pain Assessment: No/denies pain  Home Living                                          Prior Functioning/Environment              Frequency  Min 2X/week        Progress Toward Goals  OT Goals(current goals can now be found in the care plan section)  Progress towards OT goals: Progressing toward goals  Acute Rehab OT Goals Patient Stated Goal: to figure out why my  nose is bleeding. my heart doctor said i can't bleed its something really bad if i am bleeding. this scares me OT Goal Formulation: With patient/family Time For Goal Achievement: 08/05/24 Potential to Achieve Goals: Good ADL Goals Pt Will Transfer to Toilet: with supervision;regular height toilet Additional ADL Goal #1: pt will demonstrate engery conservation x2 strategies to help with oxygen saturation mod i Additional ADL Goal #2: pt will demonstrate 5 minutes of adl task with stable VSS and oxygen levels  Plan      Co-evaluation                 AM-PAC OT 6 Clicks Daily Activity     Outcome Measure   Help from another person eating meals?: None Help from another person taking care of personal grooming?: A Little Help from another person toileting, which includes using toliet, bedpan, or urinal?: A Little Help from another person bathing (including washing, rinsing, drying)?: A Little Help from another person to put on and taking off regular upper body clothing?: None Help from another person to put on and taking off regular lower body clothing?: A Little 6 Click Score: 20    End of Session Equipment Utilized During Treatment: Rolling walker (2  wheels);Oxygen  OT Visit Diagnosis: Unsteadiness on feet (R26.81);Muscle weakness (generalized) (M62.81)   Activity Tolerance Patient tolerated treatment well   Patient Left in chair;with call bell/phone within reach;with bed alarm set;Other (comment)   Nurse Communication Mobility status;Precautions        Time: [phone] OT Time Calculation (min): 38 min  Charges: OT General Charges $OT Visit: 1 Visit OT Treatments $Self Care/Home Management : 38-52 mins   Brynn, OTR/L  Acute Rehabilitation Services Office: [phone] .   Ely Molt 07/23/2024, 9:05 AM

## 2024-07-23 NOTE — Progress Notes (Signed)
 PHARMACY - ANTICOAGULATION CONSULT NOTE  Pharmacy Consult for apixaban  Indication: atrial fibrillation  Allergies  Allergen Reactions   Penicillin G Sodium Swelling and Other (See Comments)    Lip numbness and swelling- no breathing issues, though Other reaction(s): lip numbness, swelling    Patient Measurements: Height: 5' 3 (160 cm) Weight: 68.2 kg (150 lb 5.7 oz) IBW/kg (Calculated) : 52.4 HEPARIN DW (KG): 65.7  Vital Signs: Temp: 98 F (36.7 C) (08/26 1133) Temp Source: Oral (08/26 1133) BP: 140/69 (08/26 1133) Pulse Rate: 62 (08/26 1133)  Labs: Recent Labs    07/21/24 1221 07/21/24 1406 07/22/24 1248 07/23/24 0224  HGB 12.4  --   --  10.7*  HCT 36.9  --   --  32.2*  PLT 255  --   --  213  CREATININE 1.06*  --  2.61* 2.44*  TROPONINIHS 20* 13  --   --     Estimated Creatinine Clearance: 13.9 mL/min (A) (by C-G formula based on SCr of 2.44 mg/dL (H)).   Medical History: Past Medical History:  Diagnosis Date   A-fib (HCC)    Chest pain    Decreased diffusion capacity    Hyperlipidemia    Hypertension    SOB (shortness of breath)       Assessment: 89yof admitted with SOB with history Afib now SR on apixaban  5gm BID pta.  Admit Cr 1.1 > now up 2.6, age > 80 ant wt >60kg Currently patient qualifies for apixaban  dose reduction  If Cr improves back to baseline < 1.5 can consider increase apixaban  back to 5mg  BID  Goal of Therapy:   Monitor platelets by anticoagulation protocol: Yes   Plan:  Apixaban  2.5mg  BID Bmet, cbc daily  Olam Chalk Pharm.D. CPP, BCPS Clinical Pharmacist [phone] 07/23/2024 2:43 PM

## 2024-07-23 NOTE — Progress Notes (Signed)
 Progress Note  [name] Date of Encounter: 07/23/2024  Primary Cardiologist: Vinie JAYSON Maxcy, MD   Subjective   Patient seen examined at her bedside.  Inpatient Medications    Scheduled Meds:  apixaban   5 mg Oral BID   atorvastatin   10 mg Oral Daily   levothyroxine   50 mcg Oral QAC breakfast   metoprolol  tartrate  25 mg Oral BID   pantoprazole  (PROTONIX ) IV  40 mg Intravenous Q12H   PARoxetine   20 mg Oral Daily   sodium chloride  flush  3 mL Intravenous Q12H   Continuous Infusions:  PRN Meds: acetaminophen  **OR** acetaminophen , hydrALAZINE , ondansetron  (ZOFRAN ) IV   Vital Signs    Vitals:   07/22/24 1947 07/22/24 2301 07/23/24 0557 07/23/24 0727  BP: (!) 106/47 (!) 103/43 (!) 107/53 127/60  Pulse: (!) 59 (!) 53 (!) 55 64  Resp: 14 20 13 18   Temp: (!) 97.5 F (36.4 C) 98 F (36.7 C) 98 F (36.7 C) 98 F (36.7 C)  TempSrc: Oral Oral Oral Oral  SpO2: 95% 98% 97% 100%  Weight:   68.2 kg   Height:        Intake/Output Summary (Last 24 hours) at 07/23/2024 0754 Last data filed at 07/23/2024 9390 Gross per 24 hour  Intake 240 ml  Output 580 ml  Net -340 ml   Filed Weights   07/21/24 1737 07/22/24 0610 07/23/24 0557  Weight: 66.3 kg 66.1 kg 68.2 kg    Telemetry    Sinus bradycardia- Personally Reviewed  ECG     - Personally Reviewed  Physical Exam    General: Comfortable Head: Atraumatic, normal size  Eyes: PEERLA, EOMI  Neck: Supple, normal JVD Cardiac: Normal S1, S2; RRR; no murmurs, rubs, or gallops Lungs: Clear to auscultation bilaterally Abd: Soft, nontender, no hepatomegaly  Ext: warm, no edema Musculoskeletal: No deformities, BUE and BLE strength normal and equal Skin: Warm and dry, no rashes   Neuro: Alert and oriented to person, place, time, and situation, CNII-XII grossly intact, no focal deficits  Psych: Normal mood and affect   Labs    Chemistry Recent Labs  Lab 07/19/24 1111 07/21/24 1221 07/22/24 1248  07/23/24 0224  NA 139 131* 127* 125*  K 5.2 4.8 3.9 3.6  CL 99 99 90* 88*  CO2 25 24 24 25   GLUCOSE 103* 185* 173* 114*  BUN 20 21 31* 35*  CREATININE 1.18* 1.06* 2.61* 2.44*  CALCIUM  10.0 8.8* 8.6* 8.2*  PROT 6.8 6.8  --   --   ALBUMIN 4.1 3.1*  --   --   AST 25 82*  --   --   ALT 13 58*  --   --   ALKPHOS 98 80  --   --   BILITOT 0.6 1.3*  --   --   GFRNONAA  --  50* 17* 18*  ANIONGAP  --  8 13 12      Hematology Recent Labs  Lab 07/19/24 1111 07/21/24 1221 07/23/24 0224  WBC 9.1 8.7 9.2  RBC 4.06 3.98 3.50*  HGB 13.3 12.4 10.7*  HCT 39.1 36.9 32.2*  MCV 96 92.7 92.0  MCH 32.8 31.2 30.6  MCHC 34.0 33.6 33.2  RDW 12.3 13.3 13.3  PLT 264 255 213    Cardiac EnzymesNo results for input(s): TROPONINI in the last 168 hours. No results for input(s): TROPIPOC in the last 168 hours.   BNP Recent Labs  Lab 07/21/24 1221  BNP 305.8*  DDimer No results for input(s): DDIMER in the last 168 hours.   Radiology    ECHOCARDIOGRAM COMPLETE Result Date: 07/22/2024    ECHOCARDIOGRAM REPORT   [name] Date of Exam: 07/22/2024 Medical Rec #:  [id number]     Height:       63.0 in Accession #:    [id number]    Weight:       145.7 lb Date of Birth:  09/14/1930    BSA:          1.690 m Patient Age:    89 years      BP:           104/46 mmHg Patient Gender: F             HR:           52 bpm. Exam Location:  Inpatient Procedure: 2D Echo, Cardiac Doppler and Color Doppler (Both Spectral and Color            Flow Doppler were utilized during procedure). Indications:    CHF-Acute Systolic 428.21 / I50.21  History:        Patient has prior history of Echocardiogram examinations, most                 recent 11/04/2021. CKD, stage 3, Arrythmias:Atrial Fibrillation                 and Atrial Flutter, Signs/Symptoms:Chest Pain and Shortness of                 Breath; Risk Factors:Hypertension and Dyslipidemia.  Sonographer:    Thea Norlander RCS Referring Phys: TOBIE MODEST, V IMPRESSIONS  1. Left ventricular ejection fraction, by estimation, is 70 to 75%. The left ventricle has hyperdynamic function. The left ventricle has no regional wall motion abnormalities. Left ventricular diastolic parameters are indeterminate. Elevated left atrial  pressure.  2. Right ventricular systolic function is normal. The right ventricular size is normal. Tricuspid regurgitation signal is inadequate for assessing PA pressure.  3. The mitral valve is degenerative. Trivial mitral valve regurgitation. No evidence of mitral stenosis.  4. The aortic valve is tricuspid. Aortic valve regurgitation is not visualized. Aortic valve sclerosis/calcification is present, without any evidence of aortic stenosis. Aortic valve Vmax measures 1.16 m/s.  5. The inferior vena cava is dilated in size with <50% respiratory variability, suggesting right atrial pressure of 15 mmHg. FINDINGS  Left Ventricle: Left ventricular ejection fraction, by estimation, is 70 to 75%. The left ventricle has hyperdynamic function. The left ventricle has no regional wall motion abnormalities. The left ventricular internal cavity size was normal in size. There is no left ventricular hypertrophy. Left ventricular diastolic parameters are indeterminate. Elevated left atrial pressure. Right Ventricle: The right ventricular size is normal. No increase in right ventricular wall thickness. Right ventricular systolic function is normal. Tricuspid regurgitation signal is inadequate for assessing PA pressure. Left Atrium: Left atrial size was normal in size. Right Atrium: Right atrial size was normal in size. Pericardium: There is no evidence of pericardial effusion. Mitral Valve: The mitral valve is degenerative in appearance. There is mild thickening of the mitral valve leaflet(s). There is mild calcification of the mitral valve leaflet(s). Mild to moderate mitral annular calcification. Trivial mitral valve regurgitation. No evidence of mitral  valve stenosis. Tricuspid Valve: The tricuspid valve is normal in structure. Tricuspid valve regurgitation is trivial. No evidence of tricuspid stenosis. Aortic Valve: The aortic valve is tricuspid.  Aortic valve regurgitation is not visualized. Aortic valve sclerosis/calcification is present, without any evidence of aortic stenosis. Aortic valve peak gradient measures 5.4 mmHg. Pulmonic Valve: The pulmonic valve was normal in structure. Pulmonic valve regurgitation is not visualized. No evidence of pulmonic stenosis. Aorta: The aortic root is normal in size and structure. Venous: The inferior vena cava is dilated in size with less than 50% respiratory variability, suggesting right atrial pressure of 15 mmHg. IAS/Shunts: No atrial level shunt detected by color flow Doppler.  LEFT VENTRICLE PLAX 2D LVIDd:         3.40 cm   Diastology LVIDs:         1.60 cm   LV e' medial:    6.96 cm/s LV PW:         0.70 cm   LV E/e' medial:  18.8 LV IVS:        0.70 cm   LV e' lateral:   7.07 cm/s LVOT diam:     2.20 cm   LV E/e' lateral: 18.5 LV SV:         84 LV SV Index:   49 LVOT Area:     3.80 cm  RIGHT VENTRICLE             IVC RV S prime:     14.10 cm/s  IVC diam: 2.20 cm TAPSE (M-mode): 2.2 cm LEFT ATRIUM             Index        RIGHT ATRIUM           Index LA diam:        3.20 cm 1.89 cm/m   RA Area:     12.30 cm LA Vol (A2C):   45.9 ml 27.16 ml/m  RA Volume:   26.50 ml  15.68 ml/m LA Vol (A4C):   47.3 ml 27.98 ml/m LA Biplane Vol: 50.8 ml 30.06 ml/m  AORTIC VALVE AV Area (Vmax): 3.21 cm AV Vmax:        116.00 cm/s AV Peak Grad:   5.4 mmHg LVOT Vmax:      97.90 cm/s LVOT Vmean:     64.800 cm/s LVOT VTI:       0.220 m  AORTA Ao Root diam: 3.10 cm Ao Asc diam:  3.20 cm MITRAL VALVE MV Area (PHT): 3.48 cm     SHUNTS MV Decel Time: 218 msec     Systemic VTI:  0.22 m MV E velocity: 131.00 cm/s  Systemic Diam: 2.20 cm MV A velocity: 72.60 cm/s MV E/A ratio:  1.80 Wilbert Bihari MD Electronically signed by Wilbert Bihari MD  Signature Date/Time: 07/22/2024/5:15:04 PM    Final    DG Chest Portable 1 View Result Date: 07/21/2024 CLINICAL DATA:  Shortness of breath. EXAM: PORTABLE CHEST 1 VIEW COMPARISON:  01/03/2022 FINDINGS: The cardio pericardial silhouette is enlarged. Vascular congestion diffuse interstitial opacity suggest interstitial edema. No acute bony abnormality. Telemetry leads overlie the chest. IMPRESSION: Enlargement of the cardiopericardial silhouette with vascular congestion and diffuse interstitial opacity suggesting interstitial edema. Electronically Signed   By: Camellia Candle M.D.   On: 07/21/2024 13:38    Cardiac Studies  Echo pending  Patient Profile     89 y.o. female with history of atrial flutter, hypertension, hyperlipidemia  Assessment & Plan    Paroxysmal atrial flutter Acute on chronic heart failure with preserved ejection fraction Hyponatremia   Clinically she does not appear to be volume overloaded.  Will continue the  patient currently with mild diuretics.  Her blood pressure is also on the lower side.  She is in sinus rhythm today with intermittent bradycardia we will continue to monitor.  No changes in medications at this time  Defer therapy primary team for hyponatremia  For questions or updates, please contact [facility] Please consult [email or web address] for contact info under Cardiology/STEMI.      Signed, Krystalle Pilkington, DO  07/23/2024, 7:54 AM

## 2024-07-23 NOTE — Progress Notes (Signed)
 Nephrology Follow-Up Consult note   Assessment/Recommendations: Cheryl Peterson is a/an 89 y.o. female with a past medical history significant for CKD 3, HTN, history of a flutter, pAF on Eliquis  who presents to [facility] with SOB .       Non-Oliguric AKI (improving): Likely secondary to Prerenal/early ATN.  Presented with relatively normal creatinine and significantly elevated blood pressures.  Blood pressures were quickly normalized, and worry that could contribute to possible early ATN.  Remains nonoliguric.   -Chart reviewed: (medications acceptable, does not appear to have been exposed to nephrotoxins with imaging or had episodes of significant hypotension).   -Continue to monitor daily Cr, Dose meds for GFR -Monitor Daily I/Os, Daily weight  -Maintain MAP>65 for optimal renal perfusion.  -Avoid nephrotoxic medications including NSAIDs -Use synthetic opioids (Fentanyl /Dilaudid) if needed -Currently no indication for HD  Volume Status: Appears euvolemic on exam. Based on our examination and review of available imaging, our recommendation is monitor.  Medical Problem Hyponatremia, asymptomatic, worsening. Would obtain Posm, Uosm, Una before considering treatment.   Recommendations conveyed to primary service.    [name] HERO Jamaury Gumz [facility] 07/23/2024 7:59 AM  ___________________________________________________________  CC: AKI  Interval History/Subjective: Renal function improved.  Evening Lasix  held.  Adequate UOP.  Seen in bed.  States she feels all right.  Slept well overnight.  Medications:  Current Facility-Administered Medications  Medication Dose Route Frequency Provider Last Rate Last Admin   acetaminophen  (TYLENOL ) tablet 650 mg  650 mg Oral Q6H PRN Patel, Ekta V, MD       Or   acetaminophen  (TYLENOL ) suppository 650 mg  650 mg Rectal Q6H PRN Patel, Ekta V, MD       apixaban  (ELIQUIS ) tablet 5 mg  5 mg Oral BID Tobie Modest V, MD   5 mg at 07/22/24 2135    atorvastatin  (LIPITOR) tablet 10 mg  10 mg Oral Daily Patel, Ekta V, MD   10 mg at 07/22/24 1002   hydrALAZINE  (APRESOLINE ) injection 10 mg  10 mg Intravenous Q6H PRN Patel, Ekta V, MD   10 mg at 07/21/24 1757   levothyroxine  (SYNTHROID ) tablet 50 mcg  50 mcg Oral QAC breakfast Tobie Modest GAILS, MD   50 mcg at 07/23/24 9263   metoprolol  tartrate (LOPRESSOR ) tablet 25 mg  25 mg Oral BID Tobie Modest GAILS, MD       ondansetron  (ZOFRAN ) injection 4 mg  4 mg Intravenous Q6H PRN Patel, Ekta V, MD   4 mg at 07/21/24 1856   pantoprazole  (PROTONIX ) injection 40 mg  40 mg Intravenous Q12H Patel, Ekta V, MD   40 mg at 07/22/24 2135   PARoxetine  (PAXIL ) tablet 20 mg  20 mg Oral Daily Patel, Ekta V, MD   20 mg at 07/22/24 1002   sodium chloride  flush (NS) 0.9 % injection 3 mL  3 mL Intravenous Q12H Tobie Modest GAILS, MD   3 mL at 07/22/24 2135      Review of Systems: 10 systems reviewed and negative except per interval history/subjective  Physical Exam: Vitals:   07/23/24 0557 07/23/24 0727  BP: (!) 107/53 127/60  Pulse: (!) 55 64  Resp: 13 18  Temp: 98 F (36.7 C) 98 F (36.7 C)  SpO2: 97% 100%   No intake/output data recorded.  Intake/Output Summary (Last 24 hours) at 07/23/2024 0759 Last data filed at 07/23/2024 0609 Gross per 24 hour  Intake 240 ml  Output 580 ml  Net -340 ml   Constitutional: well-appearing,  no acute distress ENMT: ears and nose without scars or lesions, MMM CV: normal rate, no edema Respiratory: BLBS Gastrointestinal: soft, non-tender, no palpable masses or hernias Skin: no visible lesions or rashes Psych: alert, judgement/insight appropriate, appropriate mood and affect   Test Results I personally reviewed new and old clinical labs and radiology tests Lab Results  Component Value Date   NA 125 (L) 07/23/2024   K 3.6 07/23/2024   CL 88 (L) 07/23/2024   CO2 25 07/23/2024   BUN 35 (H) 07/23/2024   CREATININE 2.44 (H) 07/23/2024   CALCIUM  8.2 (L) 07/23/2024    ALBUMIN 3.1 (L) 07/21/2024    CBC Recent Labs  Lab 07/19/24 1111 07/21/24 1221 07/23/24 0224  WBC 9.1 8.7 9.2  NEUTROABS  --  6.2  --   HGB 13.3 12.4 10.7*  HCT 39.1 36.9 32.2*  MCV 96 92.7 92.0  PLT 264 255 213

## 2024-07-23 NOTE — Progress Notes (Signed)
 Pt's HR sustaining in the 50's, notified Anderson, MD. Advised to hold evening Metoprolol  dose.   Lonell LITTIE Lyme, RN

## 2024-07-24 ENCOUNTER — Other Ambulatory Visit: Payer: Self-pay

## 2024-07-24 DIAGNOSIS — N179 Acute kidney failure, unspecified: Secondary | ICD-10-CM | POA: Diagnosis not present

## 2024-07-24 DIAGNOSIS — E871 Hypo-osmolality and hyponatremia: Secondary | ICD-10-CM | POA: Diagnosis not present

## 2024-07-24 DIAGNOSIS — I484 Atypical atrial flutter: Secondary | ICD-10-CM

## 2024-07-24 DIAGNOSIS — I4891 Unspecified atrial fibrillation: Secondary | ICD-10-CM

## 2024-07-24 DIAGNOSIS — J9601 Acute respiratory failure with hypoxia: Secondary | ICD-10-CM | POA: Diagnosis not present

## 2024-07-24 LAB — BASIC METABOLIC PANEL WITH GFR
Anion gap: 12 (ref 5–15)
BUN: 41 mg/dL — ABNORMAL HIGH (ref 8–23)
CO2: 24 mmol/L (ref 22–32)
Calcium: 8 mg/dL — ABNORMAL LOW (ref 8.9–10.3)
Chloride: 89 mmol/L — ABNORMAL LOW (ref 98–111)
Creatinine, Ser: 1.87 mg/dL — ABNORMAL HIGH (ref 0.44–1.00)
GFR, Estimated: 25 mL/min — ABNORMAL LOW (ref >60)
Glucose, Bld: 128 mg/dL — ABNORMAL HIGH (ref 70–99)
Potassium: 4 mmol/L (ref 3.5–5.1)
Sodium: 125 mmol/L — ABNORMAL LOW (ref 135–145)

## 2024-07-24 LAB — CBC
HCT: 32.2 % — ABNORMAL LOW (ref 36.0–46.0)
Hemoglobin: 10.9 g/dL — ABNORMAL LOW (ref 12.0–15.0)
MCH: 31.3 pg (ref 26.0–34.0)
MCHC: 33.9 g/dL (ref 30.0–36.0)
MCV: 92.5 fL (ref 80.0–100.0)
Platelets: 194 K/uL (ref 150–400)
RBC: 3.48 MIL/uL — ABNORMAL LOW (ref 3.87–5.11)
RDW: 13 % (ref 11.5–15.5)
WBC: 8.5 K/uL (ref 4.0–10.5)
nRBC: 0 % (ref 0.0–0.2)

## 2024-07-24 LAB — MAGNESIUM: Magnesium: 1.8 mg/dL (ref 1.7–2.4)

## 2024-07-24 MED ORDER — ONDANSETRON HCL 4 MG PO TABS
4.0000 mg | ORAL_TABLET | Freq: Three times a day (TID) | ORAL | Status: DC
  Administered 2024-07-24 – 2024-07-27 (×9): 4 mg via ORAL
  Filled 2024-07-24 (×11): qty 1

## 2024-07-24 MED ORDER — GERHARDT'S BUTT CREAM: TOPICAL_CREAM | CUTANEOUS | Status: DC | PRN

## 2024-07-24 MED ORDER — METOPROLOL TARTRATE 12.5 MG HALF TABLET
12.5000 mg | ORAL_TABLET | Freq: Two times a day (BID) | ORAL | Status: DC
  Administered 2024-07-26 – 2024-07-27 (×3): 12.5 mg via ORAL
  Filled 2024-07-24 (×5): qty 1

## 2024-07-24 MED ORDER — UREA 15 G PO PACK
15.0000 g | PACK | Freq: Every day | ORAL | Status: DC
  Administered 2024-07-24 – 2024-07-27 (×4): 15 g via ORAL
  Filled 2024-07-24 (×4): qty 1

## 2024-07-24 NOTE — Assessment & Plan Note (Addendum)
 07-24-2024 multifactorial cause.  From afib/flutter vs CHF vs hypoxia.   07-25-2024 remains on supplemental O2.  07-26-2024 continue on supplemental O2. Her dyspnea is multifactorial. Breathing is stable at rest with O2.  07-27-2024 stable. DC to home with home O2

## 2024-07-24 NOTE — Assessment & Plan Note (Addendum)
 07-24-2024 baseline scr 1.1  07-25-2024 Scr 1.64 today.  07-26-2024 scr improved to 1.56 today.  07-27-2024 Scr 1.45 today. Stay off diuretics.

## 2024-07-24 NOTE — Progress Notes (Signed)
 Progress Note  [name] Date of Encounter: 07/24/2024  Primary Cardiologist: Vinie JAYSON Maxcy, MD   Subjective   Patient seen examined at her bedside.  Inpatient Medications    Scheduled Meds:  apixaban   2.5 mg Oral BID   atorvastatin   10 mg Oral Daily   levothyroxine   50 mcg Oral QAC breakfast   metoprolol  tartrate  25 mg Oral BID   pantoprazole   40 mg Oral BID   PARoxetine   20 mg Oral Daily   sodium chloride  flush  3 mL Intravenous Q12H   Continuous Infusions:  PRN Meds: acetaminophen  **OR** acetaminophen , hydrALAZINE , ondansetron  (ZOFRAN ) IV   Vital Signs    Vitals:   07/23/24 1133 07/23/24 1731 07/23/24 2317 07/24/24 0700  BP: (!) 140/69 (!) 117/53 114/65 121/79  Pulse: 62 (!) 57 (!) 49   Resp: 18 16 19    Temp: 98 F (36.7 C) 98 F (36.7 C) 97.9 F (36.6 C) 98 F (36.7 C)  TempSrc: Oral Oral Oral Oral  SpO2: 96% 97%    Weight:   69 kg   Height:        Intake/Output Summary (Last 24 hours) at 07/24/2024 0746 Last data filed at 07/24/2024 0550 Gross per 24 hour  Intake 960 ml  Output 1040 ml  Net -80 ml   Filed Weights   07/22/24 0610 07/23/24 0557 07/23/24 2317  Weight: 66.1 kg 68.2 kg 69 kg    Telemetry    Atrial fibrillation with controlled ventricular rate - Personally Reviewed  ECG    Atrial fibrillation with controlled ventricular rate - Personally Reviewed  Physical Exam    General: Comfortable Head: Atraumatic, normal size  Eyes: PEERLA, EOMI  Neck: Supple, normal JVD Cardiac: Normal S1, S2; RRR; no murmurs, rubs, or gallops Lungs: Clear to auscultation bilaterally Abd: Soft, nontender, no hepatomegaly  Ext: warm, no edema Musculoskeletal: No deformities, BUE and BLE strength normal and equal Skin: Warm and dry, no rashes   Neuro: Alert and oriented to person, place, time, and situation, CNII-XII grossly intact, no focal deficits  Psych: Normal mood and affect   Labs    Chemistry Recent Labs  Lab  07/19/24 1111 07/19/24 1111 07/21/24 1221 07/22/24 1248 07/23/24 0224 07/24/24 0146  NA 139   < > 131* 127* 125* 125*  K 5.2  --  4.8 3.9 3.6 4.0  CL 99  --  99 90* 88* 89*  CO2 25  --  24 24 25 24   GLUCOSE 103*   < > 185* 173* 114* 128*  BUN 20   < > 21 31* 35* 41*  CREATININE 1.18*  --  1.06* 2.61* 2.44* 1.87*  CALCIUM  10.0  --  8.8* 8.6* 8.2* 8.0*  PROT 6.8  --  6.8  --   --   --   ALBUMIN 4.1  --  3.1*  --   --   --   AST 25  --  82*  --   --   --   ALT 13  --  58*  --   --   --   ALKPHOS 98  --  80  --   --   --   BILITOT 0.6  --  1.3*  --   --   --   GFRNONAA  --    < > 50* 17* 18* 25*  ANIONGAP  --    < > 8 13 12 12    < > = values in  this interval not displayed.     Hematology Recent Labs  Lab 07/21/24 1221 07/23/24 0224 07/24/24 0146  WBC 8.7 9.2 8.5  RBC 3.98 3.50* 3.48*  HGB 12.4 10.7* 10.9*  HCT 36.9 32.2* 32.2*  MCV 92.7 92.0 92.5  MCH 31.2 30.6 31.3  MCHC 33.6 33.2 33.9  RDW 13.3 13.3 13.0  PLT 255 213 194    Cardiac EnzymesNo results for input(s): TROPONINI in the last 168 hours. No results for input(s): TROPIPOC in the last 168 hours.   BNP Recent Labs  Lab 07/21/24 1221  BNP 305.8*     DDimer No results for input(s): DDIMER in the last 168 hours.   Radiology    ECHOCARDIOGRAM COMPLETE Result Date: 07/22/2024    ECHOCARDIOGRAM REPORT   Patient Name:   YOHANA BARTHA Date of Exam: 07/22/2024 Medical Rec #:  [id number]     Height:       63.0 in Accession #:    [id number]    Weight:       145.7 lb Date of Birth:  May 12, 1930    BSA:          1.690 m Patient Age:    89 years      BP:           104/46 mmHg Patient Gender: F             HR:           52 bpm. Exam Location:  Inpatient Procedure: 2D Echo, Cardiac Doppler and Color Doppler (Both Spectral and Color            Flow Doppler were utilized during procedure). Indications:    CHF-Acute Systolic 428.21 / I50.21  History:        Patient has prior history of Echocardiogram examinations, most                  recent 11/04/2021. CKD, stage 3, Arrythmias:Atrial Fibrillation                 and Atrial Flutter, Signs/Symptoms:Chest Pain and Shortness of                 Breath; Risk Factors:Hypertension and Dyslipidemia.  Sonographer:    Thea Norlander RCS Referring Phys: TOBIE MODEST, V IMPRESSIONS  1. Left ventricular ejection fraction, by estimation, is 70 to 75%. The left ventricle has hyperdynamic function. The left ventricle has no regional wall motion abnormalities. Left ventricular diastolic parameters are indeterminate. Elevated left atrial  pressure.  2. Right ventricular systolic function is normal. The right ventricular size is normal. Tricuspid regurgitation signal is inadequate for assessing PA pressure.  3. The mitral valve is degenerative. Trivial mitral valve regurgitation. No evidence of mitral stenosis.  4. The aortic valve is tricuspid. Aortic valve regurgitation is not visualized. Aortic valve sclerosis/calcification is present, without any evidence of aortic stenosis. Aortic valve Vmax measures 1.16 m/s.  5. The inferior vena cava is dilated in size with <50% respiratory variability, suggesting right atrial pressure of 15 mmHg. FINDINGS  Left Ventricle: Left ventricular ejection fraction, by estimation, is 70 to 75%. The left ventricle has hyperdynamic function. The left ventricle has no regional wall motion abnormalities. The left ventricular internal cavity size was normal in size. There is no left ventricular hypertrophy. Left ventricular diastolic parameters are indeterminate. Elevated left atrial pressure. Right Ventricle: The right ventricular size is normal. No increase in right ventricular wall thickness. Right ventricular systolic function is normal. Tricuspid  regurgitation signal is inadequate for assessing PA pressure. Left Atrium: Left atrial size was normal in size. Right Atrium: Right atrial size was normal in size. Pericardium: There is no evidence of pericardial effusion.  Mitral Valve: The mitral valve is degenerative in appearance. There is mild thickening of the mitral valve leaflet(s). There is mild calcification of the mitral valve leaflet(s). Mild to moderate mitral annular calcification. Trivial mitral valve regurgitation. No evidence of mitral valve stenosis. Tricuspid Valve: The tricuspid valve is normal in structure. Tricuspid valve regurgitation is trivial. No evidence of tricuspid stenosis. Aortic Valve: The aortic valve is tricuspid. Aortic valve regurgitation is not visualized. Aortic valve sclerosis/calcification is present, without any evidence of aortic stenosis. Aortic valve peak gradient measures 5.4 mmHg. Pulmonic Valve: The pulmonic valve was normal in structure. Pulmonic valve regurgitation is not visualized. No evidence of pulmonic stenosis. Aorta: The aortic root is normal in size and structure. Venous: The inferior vena cava is dilated in size with less than 50% respiratory variability, suggesting right atrial pressure of 15 mmHg. IAS/Shunts: No atrial level shunt detected by color flow Doppler.  LEFT VENTRICLE PLAX 2D LVIDd:         3.40 cm   Diastology LVIDs:         1.60 cm   LV e' medial:    6.96 cm/s LV PW:         0.70 cm   LV E/e' medial:  18.8 LV IVS:        0.70 cm   LV e' lateral:   7.07 cm/s LVOT diam:     2.20 cm   LV E/e' lateral: 18.5 LV SV:         84 LV SV Index:   49 LVOT Area:     3.80 cm  RIGHT VENTRICLE             IVC RV S prime:     14.10 cm/s  IVC diam: 2.20 cm TAPSE (M-mode): 2.2 cm LEFT ATRIUM             Index        RIGHT ATRIUM           Index LA diam:        3.20 cm 1.89 cm/m   RA Area:     12.30 cm LA Vol (A2C):   45.9 ml 27.16 ml/m  RA Volume:   26.50 ml  15.68 ml/m LA Vol (A4C):   47.3 ml 27.98 ml/m LA Biplane Vol: 50.8 ml 30.06 ml/m  AORTIC VALVE AV Area (Vmax): 3.21 cm AV Vmax:        116.00 cm/s AV Peak Grad:   5.4 mmHg LVOT Vmax:      97.90 cm/s LVOT Vmean:     64.800 cm/s LVOT VTI:       0.220 m  AORTA Ao Root diam:  3.10 cm Ao Asc diam:  3.20 cm MITRAL VALVE MV Area (PHT): 3.48 cm     SHUNTS MV Decel Time: 218 msec     Systemic VTI:  0.22 m MV E velocity: 131.00 cm/s  Systemic Diam: 2.20 cm MV A velocity: 72.60 cm/s MV E/A ratio:  1.80 Wilbert Bihari MD Electronically signed by Wilbert Bihari MD Signature Date/Time: 07/22/2024/5:15:04 PM    Final     Cardiac Studies  Echo pending  Patient Profile     89 y.o. female with history of atrial flutter, hypertension, hyperlipidemia  Assessment & Plan    Paroxysmal atrial  flutter Acute on chronic heart failure with preserved ejection fraction Hyponatremia   She does not appear to be volume overloaded - will continue to hold off diuretics. Thankfully her kidney function in   She is in atrial fibrillation with controlled ventricular rate. She reports feeling nauseous and fatigue - suspect symptoms due to afib. Her am dose of lopressor  is still pending. If she continue to be in afib with symptoms she will benefit from rhythm control - can start amiodarone  later today if she has not converted by 3pm and then place on schedule for DCCV.   Labs reviewed K normal, but add mag to the previous labs. Please keep mag above 2.    Defer therapy primary team for hyponatremia  For questions or updates, please contact [facility] Please consult [email or web address] for contact info under Cardiology/STEMI.      Signed, Ayse Mccartin, DO  07/24/2024, 7:46 AM

## 2024-07-24 NOTE — Hospital Course (Addendum)
 CC: SOB x 2 weeks HPI: Cheryl Peterson is a 89 y.o. female with past medical history  of depression on Paxil , history of hyperlipidemia atorvastatin , essential hypertension metoprolol , CKD stage III, history of UTI, atrial flutter history, and paroxysmal A-fib on Eliquis , hypothyroidism, presenting today with shortness of breath.  Overall symptoms have been going on for the past few weeks but has been progressively getting worse.  Sats on initial presentation was 87% on room air patient was found to be in respiratory distress and placed on a nonrebreather.  Patient has been followed closely by cardiology and is currently wearing a Zio patch.  At bedside she is alert awake oriented gives brief history she is dyspneic and hypoxic.   Significant Events: Admitted 07/21/2024 for acute respiratory with hypoxia, acute CHF Na 131, K 4.8, CO2 of 24, BUN 21, Scr 1.06, glu 185 T prot 6.8, alb 3.1, AST 82, ALT 58, alk phos 80, T. Bili 1.3 BNP 305 Troponin I of 20 -> 13  Admission Labs: WBC 8.7, HgB 12.4, plt 255  Admission Imaging Studies: CXR Enlargement of the cardiopericardial silhouette with vascular congestion and diffuse interstitial opacity suggesting interstitial edema.  Significant Labs:   Significant Imaging Studies: 07-22-2024 echo LVEF 70%  Antibiotic Therapy: Anti-infectives (From admission, onward)    None       Procedures:   Consultants: cardiology

## 2024-07-24 NOTE — Assessment & Plan Note (Addendum)
 07-24-2024 cards deciding on whether to proceed with DCCV. Defer to cards to make pt NPO if needed. On Toprol  and eliquis  at renal dosing.  07-25-2024 back in NSR. Lopressor  dose decreased to 12.5 mg bid due to repeated episodes of night time bradycardia with HR in the 40s. On Eliquis .  07-26-2024 Pt back in afib with occ RVR. Lopressor  doses have been held over last 1.5 days due to hold parameters(hold for SBP <120 or HR <60). Cardiology has modified hold parameters. Now hold for SBP <105 or HR <60. Cards deciding if pt should start on amiodarone . Son understands reasoning for continued hospitalization.  Pt is very frustrated and wants to stop all meds and go home. Son is against this idea.  07-27-2024 back in NSR. On eliquis . DC to home with lopressor  12.5 mg bid. Discussed with cards. Dr. Sheena. She has given clearance for pt to be discharged today. F/u with cards in office in 2-4 weeks.

## 2024-07-24 NOTE — TOC Progression Note (Signed)
 Transition of Care [facility]) - Progression Note    Patient Details  Name: Cheryl Peterson MRN: [id number] Date of Birth: 04-02-1930  [facility] of [facility]) CM/SW Contact  Andrez JULIANNA George, RN Phone Number: 07/24/2024, 3:50 PM  Clinical Narrative:     Rollator ordered through [employer] and delivered to the room.  Pt qualified for home oxygen. Pt is requesting a POC for home instead of tanks as this will be lighter and easier for her getting to the cafeteria to eat, etc. Forms sent to [employer]. They will provide her a POC and concentrator to the home.  IP Care management following.  Expected Discharge Plan: Home w Home Health Services Barriers to Discharge: Continued Medical Work up               Expected Discharge Plan and Services   Discharge Planning Services: CM Consult Post Acute Care Choice: Home Health, Durable Medical Equipment Living arrangements for the past 2 months: Independent Living Facility                 DME Arranged:  (see note)           HH Agency:  (see note)         Social Drivers of Health (SDOH) Interventions SDOH Screenings   Food Insecurity: No Food Insecurity (07/22/2024)  Housing: Low Risk  (07/22/2024)  Transportation Needs: No Transportation Needs (07/22/2024)  Utilities: Not At Risk (07/22/2024)  Social Connections: Unknown (07/23/2024)  Tobacco Use: Medium Risk (07/23/2024)    Readmission Risk Interventions    11/26/2021    3:23 PM  Readmission Risk Prevention Plan  Post Dischage Appt Complete  Medication Screening Complete  Transportation Screening Complete

## 2024-07-24 NOTE — Assessment & Plan Note (Addendum)
 07-24-2024 Na 125 today. Nephrology consult managing her sodium levels.  07-25-2024 remains on urea -Na per nephrology. Dr. Windle wants to monitor pt's Na levels in-house.  07-26-2024 Na up to 132 this AM continue urea /Na.  07-27-2024 Na stable at 133. DC to home with urea /Na 15 grams daily.

## 2024-07-24 NOTE — Plan of Care (Signed)
°  Problem: Clinical Measurements: Goal: Will remain free from infection Outcome: Progressing   Problem: Activity: Goal: Risk for activity intolerance will decrease Outcome: Progressing   Problem: Nutrition: Goal: Adequate nutrition will be maintained Outcome: Progressing

## 2024-07-24 NOTE — Subjective & Objective (Addendum)
 Pt seen and examined. Spoke with son Ed via phone.  Pt remains in NSR on lopressor .  Discussed with cards. Dr. Sheena. She has given clearance for pt to be discharged today. F/u with cards in office in 2-4 weeks.

## 2024-07-24 NOTE — Progress Notes (Signed)
 Nephrology Follow-Up Consult note   Assessment/Recommendations: Cheryl Peterson is a/an 89 y.o. female with a past medical history significant for CKD 3, HTN, history of a flutter, pAF on Eliquis  who presents to [facility] with SOB .      Non-Oliguric AKI (improving): Likely secondary to Prerenal/early ATN.  Presented with relatively normal creatinine and significantly elevated blood pressures.  Blood pressures were quickly normalized, and worry that could contribute to possible early ATN.  Remains nonoliguric.   -Chart reviewed: (medications acceptable, does not appear to have been exposed to nephrotoxins with imaging or had episodes of significant hypotension).   -Continue to monitor daily Cr, Dose meds for GFR -Monitor Daily I/Os, Daily weight  -Maintain MAP>65 for optimal renal perfusion.  -Avoid nephrotoxic medications including NSAIDs -Use synthetic opioids (Fentanyl /Dilaudid) if needed -Currently no indication for HD  Volume Status: Appears euvolemic on exam. Based on our examination and review of available imaging, our recommendation is monitor.  Medical Problem Hyponatremia, asymptomatic, stable. Treating nausea could help improve sodium level Would start Urena 15 g daily Repeat labs twice daily   Recommendations conveyed to primary service.    [name] HERO Matalyn Nawaz [facility] 07/24/2024 6:56 AM  ___________________________________________________________  CC: AKI  Interval History/Subjective: No major events reported overnight.  Seen in bed.  Conversational.  Denies pain.  States she is having some nausea.  Nonoliguric.  Renal function improving.  Medications:  Current Facility-Administered Medications  Medication Dose Route Frequency Provider Last Rate Last Admin   acetaminophen  (TYLENOL ) tablet 650 mg  650 mg Oral Q6H PRN Patel, Ekta V, MD       Or   acetaminophen  (TYLENOL ) suppository 650 mg  650 mg Rectal Q6H PRN Patel, Ekta V, MD       apixaban  (ELIQUIS )  tablet 2.5 mg  2.5 mg Oral BID Dorinda Homans T, MD   2.5 mg at 07/23/24 2042   atorvastatin  (LIPITOR) tablet 10 mg  10 mg Oral Daily Patel, Ekta V, MD   10 mg at 07/23/24 0947   hydrALAZINE  (APRESOLINE ) injection 10 mg  10 mg Intravenous Q6H PRN Patel, Ekta V, MD   10 mg at 07/21/24 1757   levothyroxine  (SYNTHROID ) tablet 50 mcg  50 mcg Oral QAC breakfast Tobie Mario GAILS, MD   50 mcg at 07/24/24 0604   metoprolol  tartrate (LOPRESSOR ) tablet 25 mg  25 mg Oral BID Lenon Marien CROME, MD   25 mg at 07/23/24 9052   ondansetron  (ZOFRAN ) injection 4 mg  4 mg Intravenous Q6H PRN Patel, Ekta V, MD   4 mg at 07/21/24 1856   pantoprazole  (PROTONIX ) EC tablet 40 mg  40 mg Oral BID Djan, Prince T, MD   40 mg at 07/23/24 2042   PARoxetine  (PAXIL ) tablet 20 mg  20 mg Oral Daily Tobie Mario GAILS, MD   20 mg at 07/23/24 0947   sodium chloride  flush (NS) 0.9 % injection 3 mL  3 mL Intravenous Q12H Tobie Mario GAILS, MD   3 mL at 07/23/24 2043      Review of Systems: 10 systems reviewed and negative except per interval history/subjective  Physical Exam: Vitals:   07/23/24 1731 07/23/24 2317  BP: (!) 117/53 114/65  Pulse: (!) 57 (!) 49  Resp: 16 19  Temp: 98 F (36.7 C) 97.9 F (36.6 C)  SpO2: 97%    Total I/O In: 240 [P.O.:240] Out: 840 [Urine:840]  Intake/Output Summary (Last 24 hours) at 07/24/2024 0656 Last data filed at 07/24/2024 [phone]  Gross per 24 hour  Intake 960 ml  Output 1040 ml  Net -80 ml   Constitutional: well-appearing, no acute distress ENMT: ears and nose without scars or lesions, MMM CV: normal rate, no edema Respiratory: BLBS Gastrointestinal: soft, non-tender, no palpable masses or hernias Skin: no visible lesions or rashes Psych: alert, judgement/insight appropriate, appropriate mood and affect   Test Results I personally reviewed new and old clinical labs and radiology tests Lab Results  Component Value Date   NA 125 (L) 07/24/2024   K 4.0 07/24/2024   CL 89 (L) 07/24/2024    CO2 24 07/24/2024   BUN 41 (H) 07/24/2024   CREATININE 1.87 (H) 07/24/2024   CALCIUM  8.0 (L) 07/24/2024   ALBUMIN 3.1 (L) 07/21/2024    CBC Recent Labs  Lab 07/21/24 1221 07/23/24 0224 07/24/24 0146  WBC 8.7 9.2 8.5  NEUTROABS 6.2  --   --   HGB 12.4 10.7* 10.9*  HCT 36.9 32.2* 32.2*  MCV 92.7 92.0 92.5  PLT 255 213 194

## 2024-07-24 NOTE — Assessment & Plan Note (Addendum)
 07-24-2024 admission Scr 1.18. with diuresis, Scr increased to 2.61. diuretics held and Scr downtrending now to 1.87 today.  07-25-2024 AKI due to diuresis with IV lasix . Scr down to 1.64.   07-26-2024 Scr 1.56 today. Do not give any lasix .  07-27-2024 Scr down to 1.45. stay off diuretics. Repeat BMP in PCP office in 1-2 weeks.

## 2024-07-24 NOTE — Progress Notes (Signed)
 PROGRESS NOTE    Cheryl Peterson  FMW:[id number] DOB: August 20, 1930 DOA: 07/21/2024 PCP: Loreli Elsie [name], MD  Subjective: Pt seen and examined. Met with pt and her son at bedside. Pt qualified for home O2. RA O2 sats 87% at rest and with ambulation. Placed on 2 L/min  Cardiology notes reviewed. Pt may need DCCV. Cards is managing her afib/flutter. Pt remains on Eliquis .  Son verifies that pt lives in independent living at [facility].   Hospital Course: CC: SOB x 2 weeks HPI: Cheryl Peterson is a 89 y.o. female with past medical history  of depression on Paxil , history of hyperlipidemia atorvastatin , essential hypertension metoprolol , CKD stage III, history of UTI, atrial flutter history, and paroxysmal A-fib on Eliquis , hypothyroidism, presenting today with shortness of breath.  Overall symptoms have been going on for the past few weeks but has been progressively getting worse.  Sats on initial presentation was 87% on room air patient was found to be in respiratory distress and placed on a nonrebreather.  Patient has been followed closely by cardiology and is currently wearing a Zio patch.  At bedside she is alert awake oriented gives brief history she is dyspneic and hypoxic.   Significant Events: Admitted 07/21/2024 for acute respiratory with hypoxia, acute CHF Na 131, K 4.8, CO2 of 24, BUN 21, Scr 1.06, glu 185 T prot 6.8, alb 3.1, AST 82, ALT 58, alk phos 80, T. Bili 1.3 BNP 305 Troponin I of 20 -> 13  Admission Labs: WBC 8.7, HgB 12.4, plt 255  Admission Imaging Studies: CXR Enlargement of the cardiopericardial silhouette with vascular congestion and diffuse interstitial opacity suggesting interstitial edema.  Significant Labs:   Significant Imaging Studies: 07-22-2024 echo LVEF 70%  Antibiotic Therapy: Anti-infectives (From admission, onward)    None       Procedures:   Consultants: cardiology    Assessment and Plan: * SOB (shortness of breath) 07-24-2024  multifactorial cause.  From afib/flutter vs CHF vs hypoxia.   AKI (acute kidney injury) (HCC) 07-24-2024 admission Scr 1.18. with diuresis, Scr increased to 2.61. diuretics held and Scr downtrending now to 1.87 today.  Hyponatremia 07-24-2024 Na 125 today. Nephrology consult managing her sodium levels.  Acute hypoxic respiratory failure (HCC) 07-24-2024 qualified for home O2 @ 2L/min. Home O2 ordered. TOC aware.  Atypical atrial flutter (HCC) 07-24-2024 cards deciding on whether to proceed with DCCV. Defer to cards to make pt NPO if needed. On Toprol  and eliquis  at renal dosing.  CKD stage 3a, GFR 45-59 ml/min (HCC) 07-24-2024 baseline scr 1.1  Essential hypertension 07-24-2024 on lopressor  25 mg bid.  DVT prophylaxis: apixaban  (ELIQUIS ) tablet 2.5 mg Start: 07/23/24 2200 apixaban  (ELIQUIS ) tablet 2.5 mg     Code Status: Full Code Family Communication: discussed at bedside with pt's son Disposition Plan: return home Reason for continuing need for hospitalization: nephrology monitoring Na and Cards deciding if pt needs DCCV.  Objective: Vitals:   07/23/24 2317 07/24/24 0700 07/24/24 1012 07/24/24 1137  BP: 114/65 121/79 121/79 (!) 102/56  Pulse: (!) 49 89 73 69  Resp: 19 17  15   Temp: 97.9 F (36.6 C) 98 F (36.7 C)  98 F (36.7 C)  TempSrc: Oral Oral  Oral  SpO2:  95%  95%  Weight: 69 kg     Height:        Intake/Output Summary (Last 24 hours) at 07/24/2024 1522 Last data filed at 07/24/2024 0943 Gross per 24 hour  Intake 480 ml  Output 1840 ml  Net -1360 ml   Filed Weights   07/22/24 0610 07/23/24 0557 07/23/24 2317  Weight: 66.1 kg 68.2 kg 69 kg    Examination:  Physical Exam Vitals and nursing note reviewed.  Constitutional:      General: She is not in acute distress.    Appearance: She is not toxic-appearing or diaphoretic.  HENT:     Head: Normocephalic and atraumatic.     Nose: Nose normal.  Cardiovascular:     Rate and Rhythm: Normal rate.  Rhythm irregular.  Pulmonary:     Effort: Pulmonary effort is normal. No respiratory distress.     Breath sounds: Normal breath sounds.  Abdominal:     General: Bowel sounds are normal. There is no distension.     Palpations: Abdomen is soft.  Musculoskeletal:     Right lower leg: Edema present.     Left lower leg: Edema present.     Comments: Trace LE edema. Mostly at ankles  Skin:    General: Skin is warm and dry.     Capillary Refill: Capillary refill takes less than 2 seconds.  Neurological:     General: No focal deficit present.     Mental Status: She is alert and oriented to person, place, and time.     Data Reviewed: I have personally reviewed following labs and imaging studies  CBC: Recent Labs  Lab 07/19/24 1111 07/21/24 1221 07/23/24 0224 07/24/24 0146  WBC 9.1 8.7 9.2 8.5  NEUTROABS  --  6.2  --   --   HGB 13.3 12.4 10.7* 10.9*  HCT 39.1 36.9 32.2* 32.2*  MCV 96 92.7 92.0 92.5  PLT 264 255 213 194   Basic Metabolic Panel: Recent Labs  Lab 07/19/24 1111 07/21/24 1221 07/22/24 1248 07/23/24 0224 07/24/24 0146  NA 139 131* 127* 125* 125*  K 5.2 4.8 3.9 3.6 4.0  CL 99 99 90* 88* 89*  CO2 25 24 24 25 24   GLUCOSE 103* 185* 173* 114* 128*  BUN 20 21 31* 35* 41*  CREATININE 1.18* 1.06* 2.61* 2.44* 1.87*  CALCIUM  10.0 8.8* 8.6* 8.2* 8.0*  MG 1.9  --   --   --  1.8   GFR: Estimated Creatinine Clearance: 18.3 mL/min (A) (by C-G formula based on SCr of 1.87 mg/dL (H)). Liver Function Tests: Recent Labs  Lab 07/19/24 1111 07/21/24 1221  AST 25 82*  ALT 13 58*  ALKPHOS 98 80  BILITOT 0.6 1.3*  PROT 6.8 6.8  ALBUMIN 4.1 3.1*   BNP (last 3 results) Recent Labs    07/21/24 1221  BNP 305.8*   Scheduled Meds:  apixaban   2.5 mg Oral BID   atorvastatin   10 mg Oral Daily   levothyroxine   50 mcg Oral QAC breakfast   metoprolol  tartrate  25 mg Oral BID   ondansetron   4 mg Oral TID AC & HS   pantoprazole   40 mg Oral BID   PARoxetine   20 mg Oral Daily    sodium chloride  flush  3 mL Intravenous Q12H   urea   15 g Oral Daily   Continuous Infusions:   LOS: 2 days   Time spent: 55 minutes  Camellia Door, DO  [facility]  07/24/2024, 3:22 PM

## 2024-07-24 NOTE — Progress Notes (Signed)
 PT Cancellation Note  Patient Details Name: LAKECHIA NAY MRN: [id number] DOB: 1930/05/01   Cancelled Treatment:    Reason Eval/Treat Not Completed: (P) Medical issues which prohibited therapy (pt with very low Na (125) which may contribute to reported AM symptoms of fatigue. Per chart review, pt also with new onset symptomatic Afib, possible procedure later in the day. Will defer PT attempt until later in day if pt symptoms improved.)   Connell HERO Char Feltman 07/24/2024, 9:57 AM

## 2024-07-24 NOTE — Assessment & Plan Note (Addendum)
 07-24-2024 qualified for home O2 @ 2L/min. Home O2 ordered. TOC aware.  07-25-2024 home O2 arranged.  07-26-2024 will go home with O2. This has already been arranged. Pt's portable concentrator already delivered to her hospital room.  07-27-2024 DC to home with O2 @ 2L/min. Pt's portable concentrator already delivered to her hospital room.

## 2024-07-24 NOTE — Progress Notes (Signed)
 SATURATION QUALIFICATIONS: (This note is used to comply with regulatory documentation for home oxygen)  Patient Saturations on Room Air at Rest = 87%  Patient Saturations on Room Air while Ambulating = 87% and below  Patient Saturations on 2 Liters of oxygen while Ambulating = 88%   Please briefly explain why patient needs home oxygen: patient didn't have home O2 but is requiring now

## 2024-07-24 NOTE — Assessment & Plan Note (Addendum)
 07-24-2024 on lopressor  25 mg bid.  07-25-2024 Lopressor  dose decreased to 12.5 mg bid due to repeated episodes of night time bradycardia with HR in the 40s.   07-26-2024 on lopressor  12.5 mg bid. Cardiology has modified hold parameters. Now hold for SBP <105 or HR <60   07-27-2024 DC to home with lopressor  12.5 mg bid.

## 2024-07-25 DIAGNOSIS — J9601 Acute respiratory failure with hypoxia: Secondary | ICD-10-CM | POA: Diagnosis not present

## 2024-07-25 DIAGNOSIS — I484 Atypical atrial flutter: Secondary | ICD-10-CM | POA: Diagnosis not present

## 2024-07-25 DIAGNOSIS — E871 Hypo-osmolality and hyponatremia: Secondary | ICD-10-CM | POA: Diagnosis not present

## 2024-07-25 DIAGNOSIS — N179 Acute kidney failure, unspecified: Secondary | ICD-10-CM | POA: Diagnosis not present

## 2024-07-25 DIAGNOSIS — I10 Essential (primary) hypertension: Secondary | ICD-10-CM

## 2024-07-25 LAB — BASIC METABOLIC PANEL WITH GFR
Anion gap: 7 (ref 5–15)
BUN: 50 mg/dL — ABNORMAL HIGH (ref 8–23)
CO2: 27 mmol/L (ref 22–32)
Calcium: 8.6 mg/dL — ABNORMAL LOW (ref 8.9–10.3)
Chloride: 93 mmol/L — ABNORMAL LOW (ref 98–111)
Creatinine, Ser: 1.64 mg/dL — ABNORMAL HIGH (ref 0.44–1.00)
GFR, Estimated: 29 mL/min — ABNORMAL LOW (ref >60)
Glucose, Bld: 105 mg/dL — ABNORMAL HIGH (ref 70–99)
Potassium: 4.3 mmol/L (ref 3.5–5.1)
Sodium: 127 mmol/L — ABNORMAL LOW (ref 135–145)

## 2024-07-25 LAB — CBC
HCT: 33.1 % — ABNORMAL LOW (ref 36.0–46.0)
Hemoglobin: 11 g/dL — ABNORMAL LOW (ref 12.0–15.0)
MCH: 30.8 pg (ref 26.0–34.0)
MCHC: 33.2 g/dL (ref 30.0–36.0)
MCV: 92.7 fL (ref 80.0–100.0)
Platelets: 223 K/uL (ref 150–400)
RBC: 3.57 MIL/uL — ABNORMAL LOW (ref 3.87–5.11)
RDW: 12.8 % (ref 11.5–15.5)
WBC: 7.9 K/uL (ref 4.0–10.5)
nRBC: 0 % (ref 0.0–0.2)

## 2024-07-25 LAB — MAGNESIUM: Magnesium: 1.9 mg/dL (ref 1.7–2.4)

## 2024-07-25 MED ORDER — MAGNESIUM SULFATE IN D5W 1-5 GM/100ML-% IV SOLN
1.0000 g | Freq: Once | INTRAVENOUS | Status: AC
  Administered 2024-07-25: 1 g via INTRAVENOUS
  Filled 2024-07-25: qty 100

## 2024-07-25 NOTE — Plan of Care (Signed)
?  Problem: Clinical Measurements: Goal: Will remain free from infection Outcome: Progressing   Problem: Activity: Goal: Risk for activity intolerance will decrease Outcome: Progressing   Problem: Nutrition: Goal: Adequate nutrition will be maintained Outcome: Progressing   Problem: Coping: Goal: Level of anxiety will decrease Outcome: Progressing   Problem: Elimination: Goal: Will not experience complications related to bowel motility Outcome: Progressing Goal: Will not experience complications related to urinary retention Outcome: Progressing   

## 2024-07-25 NOTE — Progress Notes (Signed)
 Mobility Specialist Progress Note;   07/25/24 1130  Mobility  Activity Ambulated with assistance;Dangled on edge of bed (bathroom)  Level of Assistance Standby assist, set-up cues, supervision of patient - no hands on  Assistive Device Front wheel walker  Distance Ambulated (ft) 15 ft  Activity Response Tolerated well  Mobility Referral Yes  Mobility visit 1 Mobility  Mobility Specialist Start Time (ACUTE ONLY) 1130  Mobility Specialist Stop Time (ACUTE ONLY) 1142  Mobility Specialist Time Calculation (min) (ACUTE ONLY) 12 min   Pt requesting assistance to BR. Required no physical assistance during ambulation, SV. Min VC required to navigate in room w/ RW. Void successful. Deferred hallway ambulation d/t lunch tray arriving. Pt left sitting on EoB with all needs met, eating lunch. Sons present.   Lauraine Erm Mobility Specialist Please contact via SecureChat or [employer] [phone]

## 2024-07-25 NOTE — Progress Notes (Signed)
 Physical Therapy Treatment Patient Details Name: Cheryl Peterson MRN: [id number] DOB: December 07, 1929 Today's Date: 07/25/2024   History of Present Illness 89 yo female admitted 8/24 with SOB Chest xray enlargement of cardiopericardial silhouette vascular congestion and diffuse interstitial opacity suggesting interstitial edema.(+) PMH Afib HLD HTN, thoracentesis 2022 depression on Paxil  CKD III.    PT Comments  Pt received sitting EOB with posterior lean, pt agreeable to therapy session and upon standing, requesting to ambulate to bathroom. Pt needing up to CGA for safety with AD management and cues for use of rollator brakes and line awareness with supplemental O2 needed frequently. Pt would benefit from increased supervision/assist post-acute due to new O2 line needs and decreased safety/familiarity with rollator DME. Pt continues to benefit from PT services to progress toward functional mobility goals, continue to recommend HHPT.   If plan is discharge home, recommend the following: A little help with bathing/dressing/bathroom;Assistance with cooking/housework;Assist for transportation;A little help with walking and/or transfers   Can travel by private vehicle     Yes  Equipment Recommendations  Rollator (4 wheels)    Recommendations for Other Services       Precautions / Restrictions Precautions Precautions: Fall Recall of Precautions/Restrictions: Intact Precaution/Restrictions Comments: monitor O2 Restrictions Weight Bearing Restrictions Per Provider Order: No     Mobility  Bed Mobility               General bed mobility comments: pt received sitting EOB    Transfers Overall transfer level: Needs assistance Equipment used: Rollator (4 wheels) Transfers: Sit to/from Stand Sit to Stand: Supervision           General transfer comment: Cues prior to standing and again prior to sitting to recall safe UE placement and use of brakes with rollator; pt forgets to unlock  brakes after standing up but able to realize it after walking a few feet.    Ambulation/Gait Ambulation/Gait assistance: Contact guard assist, Supervision Gait Distance (Feet): 20 Feet (x2) Assistive device: Rollator (4 wheels) Gait Pattern/deviations: Decreased stride length, Shuffle, Step-to pattern       General Gait Details: pt heel walking; SpO2 92% and above on 2L O2 [place], distance limited due to pt urinary urgency, pt requesting return to chair after toileting to finish her dinner tray. cues for improved step length/foot clearance but pt resistant and states I can't; pt needs reminders for O2 line awareness when turning/sitting.   Stairs             Wheelchair Mobility     Tilt Bed    Modified Cephas (Stroke Patients Only)       Balance Overall balance assessment: Needs assistance Sitting-balance support: No upper extremity supported, Feet supported Sitting balance-Leahy Scale: Fair     Standing balance support: Bilateral upper extremity supported, During functional activity, Reliant on assistive device for balance Standing balance-Leahy Scale: Poor Standing balance comment: posterior instability upon standing but no LOB using 4WW                            Communication Communication Communication: No apparent difficulties  Cognition Arousal: Alert Behavior During Therapy: WFL for tasks assessed/performed   PT - Cognitive impairments: Safety/Judgement, Memory                       PT - Cognition Comments: Pt received seated EOB with significant posterior lean, scooted forward at EOB which appeared unsafe. Pt  reports she feels stable however. Pt A&O but decreased safety with O2 line awareness while mobilizing with rollator. Pt forgets to lock rollator brakes until cued. Following commands: Intact      Cueing Cueing Techniques: Verbal cues, Gestural cues  Exercises      General Comments General comments (skin integrity, edema,  etc.): small amount of urinary incontinence while backing up to sit on toilet      Pertinent Vitals/Pain Pain Assessment Pain Assessment: No/denies pain    Home Living                          Prior Function            PT Goals (current goals can now be found in the care plan section) Acute Rehab PT Goals Patient Stated Goal: Go home PT Goal Formulation: With patient Time For Goal Achievement: 08/05/24 Progress towards PT goals: Progressing toward goals    Frequency    Min 2X/week      PT Plan      Co-evaluation              AM-PAC PT 6 Clicks Mobility   Outcome Measure  Help needed turning from your back to your side while in a flat bed without using bedrails?: None Help needed moving from lying on your back to sitting on the side of a flat bed without using bedrails?: A Little Help needed moving to and from a bed to a chair (including a wheelchair)?: A Little Help needed standing up from a chair using your arms (e.g., wheelchair or bedside chair)?: A Little Help needed to walk in hospital room?: A Little Help needed climbing 3-5 steps with a railing? : A Little 6 Click Score: 19    End of Session Equipment Utilized During Treatment: Gait belt;Oxygen Activity Tolerance: Patient tolerated treatment well Patient left: in chair;with call bell/phone within reach;with chair alarm set Nurse Communication: Mobility status PT Visit Diagnosis: Unsteadiness on feet (R26.81);Other abnormalities of gait and mobility (R26.89);Repeated falls (R29.6);Muscle weakness (generalized) (M62.81);History of falling (Z91.81);Difficulty in walking, not elsewhere classified (R26.2)     Time: 8283-8268 PT Time Calculation (min) (ACUTE ONLY): 15 min  Charges:    $Gait Training: 8-22 mins PT General Charges $$ ACUTE PT VISIT: 1 Visit                     Jaquail Mclees P., PTA Acute Rehabilitation Services Secure Chat Preferred 9a-5:30pm Office: [phone]    Connell CHRISTELLA Blue 07/25/2024, 5:47 PM

## 2024-07-25 NOTE — Progress Notes (Signed)
 PROGRESS NOTE    AHTZIRI JEFFRIES  FMW:[id number] DOB: 02-10-1930 DOA: 07/21/2024 PCP: Loreli Elsie [name], MD  Subjective: Pt seen and examined.  Pt back in NSR. Cardiology not planning on starting amiodarone  or giving pt DCCV now.  Na up to 127. Nephrology still working on her Na levels.   Hospital Course: CC: SOB x 2 weeks HPI: Cheryl Peterson is a 89 y.o. female with past medical history  of depression on Paxil , history of hyperlipidemia atorvastatin , essential hypertension metoprolol , CKD stage III, history of UTI, atrial flutter history, and paroxysmal A-fib on Eliquis , hypothyroidism, presenting today with shortness of breath.  Overall symptoms have been going on for the past few weeks but has been progressively getting worse.  Sats on initial presentation was 87% on room air patient was found to be in respiratory distress and placed on a nonrebreather.  Patient has been followed closely by cardiology and is currently wearing a Zio patch.  At bedside she is alert awake oriented gives brief history she is dyspneic and hypoxic.   Significant Events: Admitted 07/21/2024 for acute respiratory with hypoxia, acute CHF Na 131, K 4.8, CO2 of 24, BUN 21, Scr 1.06, glu 185 T prot 6.8, alb 3.1, AST 82, ALT 58, alk phos 80, T. Bili 1.3 BNP 305 Troponin I of 20 -> 13  Admission Labs: WBC 8.7, HgB 12.4, plt 255  Admission Imaging Studies: CXR Enlargement of the cardiopericardial silhouette with vascular congestion and diffuse interstitial opacity suggesting interstitial edema.  Significant Labs:   Significant Imaging Studies: 07-22-2024 echo LVEF 70%  Antibiotic Therapy: Anti-infectives (From admission, onward)    None       Procedures:   Consultants: cardiology    Assessment and Plan: * SOB (shortness of breath) 07-24-2024 multifactorial cause.  From afib/flutter vs CHF vs hypoxia.   07-25-2024 remains on supplemental O2.  AKI (acute kidney injury) (HCC) 07-24-2024  admission Scr 1.18. with diuresis, Scr increased to 2.61. diuretics held and Scr downtrending now to 1.87 today.  07-25-2024 AKI due to diuresis with IV lasix . Scr down to 1.64.   Hyponatremia 07-24-2024 Na 125 today. Nephrology consult managing her sodium levels.  07-25-2024 remains on urea -Na per nephrology. Dr. Windle wants to monitor pt's Na levels in-house.  Acute hypoxic respiratory failure (HCC) 07-24-2024 qualified for home O2 @ 2L/min. Home O2 ordered. TOC aware.  07-25-2024 home O2 arranged.  Atypical atrial flutter (HCC) 07-24-2024 cards deciding on whether to proceed with DCCV. Defer to cards to make pt NPO if needed. On Toprol  and eliquis  at renal dosing.  07-25-2024 back in NSR. Lopressor  dose decreased to 12.5 mg bid due to repeated episodes of night time bradycardia with HR in the 40s. On Eliquis .  CKD stage 3a, GFR 45-59 ml/min (HCC) 07-24-2024 baseline scr 1.1  07-25-2024 Scr 1.64 today.  Essential hypertension 07-24-2024 on lopressor  25 mg bid.  07-25-2024 Lopressor  dose decreased to 12.5 mg bid due to repeated episodes of night time bradycardia with HR in the 40s.   DVT prophylaxis: apixaban  (ELIQUIS ) tablet 2.5 mg Start: 07/23/24 2200 apixaban  (ELIQUIS ) tablet 2.5 mg     Code Status: Full Code Family Communication: no family at bedside Disposition Plan: return home to independent living [facility]) Reason for continuing need for hospitalization: Monitoring serum Na levels.  Objective: Vitals:   07/24/24 2312 07/25/24 0345 07/25/24 0809 07/25/24 1146  BP: (!) 103/53 (!) 106/47 (!) 114/49 (!) 138/58  Pulse: (!) 52 (!) 53 (!) 57  Resp: 14 12 14    Temp: 98.2 F (36.8 C) 97.9 F (36.6 C) 98 F (36.7 C) 99.1 F (37.3 C)  TempSrc: Oral Oral Oral Oral  SpO2: 96% 96% 96% 94%  Weight:  68.8 kg    Height:        Intake/Output Summary (Last 24 hours) at 07/25/2024 1159 Last data filed at 07/25/2024 0936 Gross per 24 hour  Intake 600 ml  Output  1600 ml  Net -1000 ml   Filed Weights   07/23/24 0557 07/23/24 2317 07/25/24 0345  Weight: 68.2 kg 69 kg 68.8 kg    Examination:  Physical Exam Vitals and nursing note reviewed.  Constitutional:      General: She is not in acute distress.    Appearance: She is normal weight. She is not toxic-appearing or diaphoretic.  HENT:     Nose: Nose normal.  Eyes:     General: No scleral icterus. Cardiovascular:     Rate and Rhythm: Regular rhythm. Bradycardia present.  Pulmonary:     Effort: Pulmonary effort is normal. No respiratory distress.     Breath sounds: Normal breath sounds.  Abdominal:     General: Abdomen is flat. Bowel sounds are normal.     Palpations: Abdomen is soft.  Musculoskeletal:     Right lower leg: No edema.     Left lower leg: No edema.  Skin:    General: Skin is warm and dry.     Capillary Refill: Capillary refill takes less than 2 seconds.  Neurological:     Mental Status: She is alert and oriented to person, place, and time.     Data Reviewed: I have personally reviewed following labs and imaging studies  CBC: Recent Labs  Lab 07/19/24 1111 07/21/24 1221 07/23/24 0224 07/24/24 0146 07/25/24 0245  WBC 9.1 8.7 9.2 8.5 7.9  NEUTROABS  --  6.2  --   --   --   HGB 13.3 12.4 10.7* 10.9* 11.0*  HCT 39.1 36.9 32.2* 32.2* 33.1*  MCV 96 92.7 92.0 92.5 92.7  PLT 264 255 213 194 223   Basic Metabolic Panel: Recent Labs  Lab 07/19/24 1111 07/21/24 1221 07/22/24 1248 07/23/24 0224 07/24/24 0146 07/25/24 0245  NA 139 131* 127* 125* 125* 127*  K 5.2 4.8 3.9 3.6 4.0 4.3  CL 99 99 90* 88* 89* 93*  CO2 25 24 24 25 24 27   GLUCOSE 103* 185* 173* 114* 128* 105*  BUN 20 21 31* 35* 41* 50*  CREATININE 1.18* 1.06* 2.61* 2.44* 1.87* 1.64*  CALCIUM  10.0 8.8* 8.6* 8.2* 8.0* 8.6*  MG 1.9  --   --   --  1.8 1.9   GFR: Estimated Creatinine Clearance: 20.8 mL/min (A) (by C-G formula based on SCr of 1.64 mg/dL (H)). Liver Function Tests: Recent Labs  Lab  07/19/24 1111 07/21/24 1221  AST 25 82*  ALT 13 58*  ALKPHOS 98 80  BILITOT 0.6 1.3*  PROT 6.8 6.8  ALBUMIN 4.1 3.1*   BNP (last 3 results) Recent Labs    07/21/24 1221  BNP 305.8*   Scheduled Meds:  apixaban   2.5 mg Oral BID   atorvastatin   10 mg Oral Daily   levothyroxine   50 mcg Oral QAC breakfast   metoprolol  tartrate  12.5 mg Oral BID   ondansetron   4 mg Oral TID AC & HS   pantoprazole   40 mg Oral BID   PARoxetine   20 mg Oral Daily   sodium chloride  flush  3 mL Intravenous Q12H   urea   15 g Oral Daily   Continuous Infusions:  magnesium  sulfate bolus IVPB      LOS: 3 days   Time spent: 55 minutes  Camellia Door, DO  [facility]  07/25/2024, 11:59 AM

## 2024-07-25 NOTE — Progress Notes (Signed)
 Nephrology Follow-Up Consult note   Assessment/Recommendations: Cheryl Peterson is a/an 89 y.o. female with a past medical history significant for CKD 3, HTN, history of a flutter, pAF on Eliquis  who presents to [facility] with SOB .     Non-Oliguric AKI (improving): Likely secondary to Prerenal/early ATN.  Presented with relatively normal creatinine and significantly elevated blood pressures.  Blood pressures were quickly normalized, and worry that could contribute to possible early ATN.  Remains nonoliguric.   -Chart reviewed: (medications acceptable, does not appear to have been exposed to nephrotoxins with imaging or had episodes of significant hypotension).   -Continue to monitor daily Cr, Dose meds for GFR -Monitor Daily I/Os, Daily weight  -Maintain MAP>65 for optimal renal perfusion.  -Avoid nephrotoxic medications including NSAIDs -Use synthetic opioids (Fentanyl /Dilaudid) if needed -Currently no indication for HD  Volume Status: Appears euvolemic on exam. Based on our examination and review of available imaging, our recommendation is monitor.  Medical Problem Hyponatremia, asymptomatic, stable/improved Treating nausea could help improve sodium level Continue Urena 15 g daily Goal Na ~132 by tomorrow AM.    Recommendations conveyed to primary service.    [name] HERO Mashell Sieben [facility] 07/25/2024 6:53 AM  ___________________________________________________________  CC: AKI  Interval History/Subjective: No events reported overnight.  Slept well.  Seen in bed.  Son at bedside.  Conversational.  At baseline.  Nonoliguric.  Renal function & sodium improving.  Medications:  Current Facility-Administered Medications  Medication Dose Route Frequency Provider Last Rate Last Admin   acetaminophen  (TYLENOL ) tablet 650 mg  650 mg Oral Q6H PRN Patel, Ekta V, MD       Or   acetaminophen  (TYLENOL ) suppository 650 mg  650 mg Rectal Q6H PRN Patel, Ekta V, MD       apixaban   (ELIQUIS ) tablet 2.5 mg  2.5 mg Oral BID Dorinda Homans T, MD   2.5 mg at 07/24/24 2056   atorvastatin  (LIPITOR) tablet 10 mg  10 mg Oral Daily Patel, Ekta V, MD   10 mg at 07/24/24 1013   Gerhardt's butt cream   Topical PRN Laurence Locus, DO       hydrALAZINE  (APRESOLINE ) injection 10 mg  10 mg Intravenous Q6H PRN Patel, Ekta V, MD   10 mg at 07/21/24 1757   levothyroxine  (SYNTHROID ) tablet 50 mcg  50 mcg Oral QAC breakfast Tobie Mario GAILS, MD   50 mcg at 07/25/24 9392   metoprolol  tartrate (LOPRESSOR ) tablet 12.5 mg  12.5 mg Oral BID Laurence Locus, DO       ondansetron  (ZOFRAN ) tablet 4 mg  4 mg Oral TID AC & HS Laurence Locus, DO   4 mg at 07/25/24 9392   pantoprazole  (PROTONIX ) EC tablet 40 mg  40 mg Oral BID Djan, Prince T, MD   40 mg at 07/24/24 2056   PARoxetine  (PAXIL ) tablet 20 mg  20 mg Oral Daily Patel, Ekta V, MD   20 mg at 07/24/24 1013   sodium chloride  flush (NS) 0.9 % injection 3 mL  3 mL Intravenous Q12H Tobie Mario V, MD   3 mL at 07/24/24 1014   urea  (URE-NA) oral packet 15 g  15 g Oral Daily Laurence Locus, DO   15 g at 07/24/24 1247      Review of Systems: 10 systems reviewed and negative except per interval history/subjective  Physical Exam: Vitals:   07/24/24 2312 07/25/24 0345  BP: (!) 103/53 (!) 106/47  Pulse: (!) 52 (!) 53  Resp: 14 12  Temp: 98.2 F (36.8 C) 97.9 F (36.6 C)  SpO2: 96% 96%   Total I/O In: 480 [P.O.:480] Out: 800 [Urine:800]  Intake/Output Summary (Last 24 hours) at 07/25/2024 9346 Last data filed at 07/25/2024 9386 Gross per 24 hour  Intake 1080 ml  Output 2150 ml  Net -1070 ml   Constitutional: well-appearing, no acute distress ENMT: ears and nose without scars or lesions, MMM CV: normal rate, no edema Respiratory: BLBS Gastrointestinal: soft, non-tender, no palpable masses or hernias Skin: no visible lesions or rashes Psych: alert, judgement/insight appropriate, appropriate mood and affect   Test Results I personally reviewed new and old  clinical labs and radiology tests Lab Results  Component Value Date   NA 127 (L) 07/25/2024   K 4.3 07/25/2024   CL 93 (L) 07/25/2024   CO2 27 07/25/2024   BUN 50 (H) 07/25/2024   CREATININE 1.64 (H) 07/25/2024   CALCIUM  8.6 (L) 07/25/2024   ALBUMIN 3.1 (L) 07/21/2024    CBC Recent Labs  Lab 07/21/24 1221 07/23/24 0224 07/24/24 0146 07/25/24 0245  WBC 8.7 9.2 8.5 7.9  NEUTROABS 6.2  --   --   --   HGB 12.4 10.7* 10.9* 11.0*  HCT 36.9 32.2* 32.2* 33.1*  MCV 92.7 92.0 92.5 92.7  PLT 255 213 194 223

## 2024-07-25 NOTE — Care Management Important Message (Signed)
 Important Message  Patient Details  Name: Cheryl Peterson MRN: [id number] Date of Birth: [birth year]   Important Message Given:  Yes - Medicare IM     Claretta Deed 07/25/2024, 4:07 PM

## 2024-07-25 NOTE — TOC Progression Note (Signed)
 Transition of Care [facility]) - Progression Note    Patient Details  Name: [name] MRN: [id number] Date of Birth: [birth year]  [facility] CM/SW Contact  Andrez JULIANNA George, RN Phone Number: 07/25/2024, 11:08 AM  Clinical Narrative:     Son has arranged 12 hour a day caregivers for [place] through [employer]. [facility] is set up for therapies at home.  Oxygen concentrator delivered to room for transport home and [employer] will deliver the larger concentrator and POC to her apartment after d/c.  Rollator to be delivered to her apartment per [employer].  Son to provide transport home. IP Care management following.   Expected Discharge Plan: Home w Home Health Services Barriers to Discharge: Continued Medical Work up               Expected Discharge Plan and Services   Discharge Planning Services: CM Consult Post Acute Care Choice: Home Health, Durable Medical Equipment Living arrangements for the past 2 months: Independent Living Facility                 DME Arranged:  (see note)           HH Agency:  (see note)         Social Drivers of Health (SDOH) Interventions SDOH Screenings   Food Insecurity: No Food Insecurity (07/22/2024)  Housing: Low Risk  (07/22/2024)  Transportation Needs: No Transportation Needs (07/22/2024)  Utilities: Not At Risk (07/22/2024)  Social Connections: Unknown (07/23/2024)  Tobacco Use: Medium Risk (07/23/2024)    Readmission Risk Interventions    11/26/2021    3:23 PM  Readmission Risk Prevention Plan  Post Dischage Appt Complete  Medication Screening Complete  Transportation Screening Complete

## 2024-07-25 NOTE — Progress Notes (Signed)
 Progress Note  [name] Date of Encounter: 07/25/2024  Primary Cardiologist: Vinie JAYSON Maxcy, MD   Subjective   Patient seen examined at her bedside. Back in sinus rhythm  Inpatient Medications    Scheduled Meds:  apixaban   2.5 mg Oral BID   atorvastatin   10 mg Oral Daily   levothyroxine   50 mcg Oral QAC breakfast   metoprolol  tartrate  12.5 mg Oral BID   ondansetron   4 mg Oral TID AC & HS   pantoprazole   40 mg Oral BID   PARoxetine   20 mg Oral Daily   sodium chloride  flush  3 mL Intravenous Q12H   urea   15 g Oral Daily   Continuous Infusions:  PRN Meds: acetaminophen  **OR** acetaminophen , Gerhardt's butt cream, hydrALAZINE    Vital Signs    Vitals:   07/24/24 1548 07/24/24 1915 07/24/24 2312 07/25/24 0345  BP: 131/60 (!) 101/46 (!) 103/53 (!) 106/47  Pulse: (!) 55 (!) 54 (!) 52 (!) 53  Resp: 15 17 14 12   Temp: (!) 97.5 F (36.4 C) 98.2 F (36.8 C) 98.2 F (36.8 C) 97.9 F (36.6 C)  TempSrc: Oral Oral Oral Oral  SpO2: 94% 97% 96% 96%  Weight:    68.8 kg  Height:        Intake/Output Summary (Last 24 hours) at 07/25/2024 0810 Last data filed at 07/25/2024 9386 Gross per 24 hour  Intake 840 ml  Output 1650 ml  Net -810 ml   Filed Weights   07/23/24 0557 07/23/24 2317 07/25/24 0345  Weight: 68.2 kg 69 kg 68.8 kg    Telemetry    Atrial fibrillation with controlled ventricular rate - Personally Reviewed  ECG    Atrial fibrillation with controlled ventricular rate - Personally Reviewed  Physical Exam    General: Comfortable Head: Atraumatic, normal size  Eyes: PEERLA, EOMI  Neck: Supple, normal JVD Cardiac: Normal S1, S2; RRR; no murmurs, rubs, or gallops Lungs: Clear to auscultation bilaterally Abd: Soft, nontender, no hepatomegaly  Ext: warm, no edema Musculoskeletal: No deformities, BUE and BLE strength normal and equal Skin: Warm and dry, no rashes   Neuro: Alert and oriented to person, place, time, and situation,  CNII-XII grossly intact, no focal deficits  Psych: Normal mood and affect   Labs    Chemistry Recent Labs  Lab 07/19/24 1111 07/21/24 1221 07/22/24 1248 07/23/24 0224 07/24/24 0146 07/25/24 0245  NA 139 131*   < > 125* 125* 127*  K 5.2 4.8   < > 3.6 4.0 4.3  CL 99 99   < > 88* 89* 93*  CO2 25 24   < > 25 24 27   GLUCOSE 103* 185*   < > 114* 128* 105*  BUN 20 21   < > 35* 41* 50*  CREATININE 1.18* 1.06*   < > 2.44* 1.87* 1.64*  CALCIUM  10.0 8.8*   < > 8.2* 8.0* 8.6*  PROT 6.8 6.8  --   --   --   --   ALBUMIN 4.1 3.1*  --   --   --   --   AST 25 82*  --   --   --   --   ALT 13 58*  --   --   --   --   ALKPHOS 98 80  --   --   --   --   BILITOT 0.6 1.3*  --   --   --   --  GFRNONAA  --  50*   < > 18* 25* 29*  ANIONGAP  --  8   < > 12 12 7    < > = values in this interval not displayed.     Hematology Recent Labs  Lab 07/23/24 0224 07/24/24 0146 07/25/24 0245  WBC 9.2 8.5 7.9  RBC 3.50* 3.48* 3.57*  HGB 10.7* 10.9* 11.0*  HCT 32.2* 32.2* 33.1*  MCV 92.0 92.5 92.7  MCH 30.6 31.3 30.8  MCHC 33.2 33.9 33.2  RDW 13.3 13.0 12.8  PLT 213 194 223    Cardiac EnzymesNo results for input(s): TROPONINI in the last 168 hours. No results for input(s): TROPIPOC in the last 168 hours.   BNP Recent Labs  Lab 07/21/24 1221  BNP 305.8*     DDimer No results for input(s): DDIMER in the last 168 hours.   Radiology    No results found.   Cardiac Studies  Echo pending  Patient Profile     89 y.o. female with history of atrial flutter, hypertension, hyperlipidemia  Assessment & Plan    Paroxysmal atrial flutter Acute on chronic heart failure with preserved ejection fraction Hyponatremia   She does not appear to be volume overloaded - will continue to hold off diuretics. Thankfully her kidney function continue to improve.   She is back in sinus rhythm - only on metoprolol  and did not need amiodarone .     Please keep mag above 2 and K above 4.    Defer  therapy primary team for hyponatremia  For questions or updates, please contact [facility] Please consult [email or web address] for contact info under Cardiology/STEMI.      Signed, Jolena Kittle, DO  07/25/2024, 8:10 AM

## 2024-07-26 DIAGNOSIS — E871 Hypo-osmolality and hyponatremia: Secondary | ICD-10-CM | POA: Diagnosis not present

## 2024-07-26 DIAGNOSIS — E440 Moderate protein-calorie malnutrition: Secondary | ICD-10-CM | POA: Insufficient documentation

## 2024-07-26 DIAGNOSIS — I484 Atypical atrial flutter: Secondary | ICD-10-CM | POA: Diagnosis not present

## 2024-07-26 DIAGNOSIS — N179 Acute kidney failure, unspecified: Secondary | ICD-10-CM | POA: Diagnosis not present

## 2024-07-26 DIAGNOSIS — J9601 Acute respiratory failure with hypoxia: Secondary | ICD-10-CM | POA: Diagnosis not present

## 2024-07-26 LAB — BASIC METABOLIC PANEL WITH GFR
Anion gap: 10 (ref 5–15)
BUN: 52 mg/dL — ABNORMAL HIGH (ref 8–23)
CO2: 28 mmol/L (ref 22–32)
Calcium: 8.7 mg/dL — ABNORMAL LOW (ref 8.9–10.3)
Chloride: 94 mmol/L — ABNORMAL LOW (ref 98–111)
Creatinine, Ser: 1.56 mg/dL — ABNORMAL HIGH (ref 0.44–1.00)
GFR, Estimated: 31 mL/min — ABNORMAL LOW (ref >60)
Glucose, Bld: 109 mg/dL — ABNORMAL HIGH (ref 70–99)
Potassium: 4.6 mmol/L (ref 3.5–5.1)
Sodium: 132 mmol/L — ABNORMAL LOW (ref 135–145)

## 2024-07-26 LAB — MAGNESIUM: Magnesium: 2.1 mg/dL (ref 1.7–2.4)

## 2024-07-26 MED ORDER — BOOST PLUS PO LIQD
237.0000 mL | Freq: Every day | ORAL | Status: DC
  Administered 2024-07-26: 237 mL via ORAL
  Filled 2024-07-26 (×2): qty 237

## 2024-07-26 NOTE — Progress Notes (Signed)
 Noticed a rhythm change on the monitor, EKG completed and in chart, Cardiology PA paged. Patient appears to have some congestion in her throat but no other symptoms, suction was setup and will continue to monitor.

## 2024-07-26 NOTE — Progress Notes (Addendum)
 PROGRESS NOTE    Cheryl Peterson  FMW:[id number] DOB: Apr 19, 1930 DOA: 07/21/2024 PCP: Loreli Elsie [name], MD  Subjective: Pt seen and examined. Pt's son Ed at bedside.  Pt back in afib. Lopressor  doses have been held over last 1.5 days due to hold parameters(hold for SBP <120 or HR <60)  Cardiology has modified hold parameters. Now hold for SBP <105 or HR <60.  Cards deciding if pt should start on amiodarone .  Son understands reasoning for continued hospitalization.  Pt is very frustrated and wants to stop all meds and go home. Son is against this idea.   Hospital Course: CC: SOB x 2 weeks HPI: Cheryl Peterson is a 89 y.o. female with past medical history  of depression on Paxil , history of hyperlipidemia atorvastatin , essential hypertension metoprolol , CKD stage III, history of UTI, atrial flutter history, and paroxysmal A-fib on Eliquis , hypothyroidism, presenting today with shortness of breath.  Overall symptoms have been going on for the past few weeks but has been progressively getting worse.  Sats on initial presentation was 87% on room air patient was found to be in respiratory distress and placed on a nonrebreather.  Patient has been followed closely by cardiology and is currently wearing a Zio patch.  At bedside she is alert awake oriented gives brief history she is dyspneic and hypoxic.   Significant Events: Admitted 07/21/2024 for acute respiratory with hypoxia, acute CHF Na 131, K 4.8, CO2 of 24, BUN 21, Scr 1.06, glu 185 T prot 6.8, alb 3.1, AST 82, ALT 58, alk phos 80, T. Bili 1.3 BNP 305 Troponin I of 20 -> 13  Admission Labs: WBC 8.7, HgB 12.4, plt 255  Admission Imaging Studies: CXR Enlargement of the cardiopericardial silhouette with vascular congestion and diffuse interstitial opacity suggesting interstitial edema.  Significant Labs:   Significant Imaging Studies: 07-22-2024 echo LVEF 70%  Antibiotic Therapy: Anti-infectives (From admission, onward)    None        Procedures:   Consultants: cardiology    Assessment and Plan: * SOB (shortness of breath) 07-24-2024 multifactorial cause.  From afib/flutter vs CHF vs hypoxia.   07-25-2024 remains on supplemental O2.  07-26-2024 continue on supplemental O2. Her dyspnea is multifactorial. Breathing is stable at rest with O2.  AKI (acute kidney injury) (HCC) 07-24-2024 admission Scr 1.18. with diuresis, Scr increased to 2.61. diuretics held and Scr downtrending now to 1.87 today.  07-25-2024 AKI due to diuresis with IV lasix . Scr down to 1.64.   07-26-2024 Scr 1.56 today. Do not give any lasix .  Hyponatremia 07-24-2024 Na 125 today. Nephrology consult managing her sodium levels.  07-25-2024 remains on urea -Na per nephrology. Dr. Windle wants to monitor pt's Na levels in-house.  07-26-2024 Na up to 132 this AM continue urea /Na.  Acute hypoxic respiratory failure (HCC) 07-24-2024 qualified for home O2 @ 2L/min. Home O2 ordered. TOC aware.  07-25-2024 home O2 arranged.  07-26-2024 will go home with O2. This has already been arranged. Pt's portable concentrator already delivered to her hospital room.  Atypical atrial flutter (HCC) 07-24-2024 cards deciding on whether to proceed with DCCV. Defer to cards to make pt NPO if needed. On Toprol  and eliquis  at renal dosing.  07-25-2024 back in NSR. Lopressor  dose decreased to 12.5 mg bid due to repeated episodes of night time bradycardia with HR in the 40s. On Eliquis .  07-26-2024 Pt back in afib with occ RVR. Lopressor  doses have been held over last 1.5 days due to hold parameters(hold for  SBP <120 or HR <60). Cardiology has modified hold parameters. Now hold for SBP <105 or HR <60. Cards deciding if pt should start on amiodarone . Son understands reasoning for continued hospitalization.  Pt is very frustrated and wants to stop all meds and go home. Son is against this idea.  CKD stage 3a, GFR 45-59 ml/min (HCC) 07-24-2024 baseline scr  1.1  07-25-2024 Scr 1.64 today.  07-26-2024 scr improved to 1.56 today.  Essential hypertension 07-24-2024 on lopressor  25 mg bid.  07-25-2024 Lopressor  dose decreased to 12.5 mg bid due to repeated episodes of night time bradycardia with HR in the 40s.   07-26-2024 on lopressor  12.5 mg bid. Cardiology has modified hold parameters. Now hold for SBP <105 or HR <60   Malnutrition of moderate degree Nutrition Status: Nutrition Problem: Moderate Malnutrition Etiology: chronic illness Signs/Symptoms: mild muscle depletion, mild fat depletion Interventions: MVI, Boost Plus, Magic cup     DVT prophylaxis: apixaban  (ELIQUIS ) tablet 2.5 mg Start: 07/23/24 2200 apixaban  (ELIQUIS ) tablet 2.5 mg     Code Status: Full Code Family Communication: discussed with pt and son Ed at bedside Disposition Plan: return home to independent living at [facility] Reason for continuing need for hospitalization: back in afib. Cardiology management rhythm/rate.  Objective: Vitals:   07/26/24 0304 07/26/24 0643 07/26/24 0742 07/26/24 1227  BP: (!) 134/55  (!) 120/53 121/65  Pulse: 60  99 (!) 55  Resp: 19  19 14   Temp: 98.1 F (36.7 C)  98 F (36.7 C) 97.8 F (36.6 C)  TempSrc: Oral  Oral Oral  SpO2: 91%  90% 95%  Weight:  68.1 kg    Height:        Intake/Output Summary (Last 24 hours) at 07/26/2024 1531 Last data filed at 07/26/2024 1207 Gross per 24 hour  Intake --  Output 2150 ml  Net -2150 ml   Filed Weights   07/23/24 2317 07/25/24 0345 07/26/24 0643  Weight: 69 kg 68.8 kg 68.1 kg    Examination:  Physical Exam Vitals and nursing note reviewed.  Constitutional:      General: She is not in acute distress.    Appearance: She is not toxic-appearing or diaphoretic.  HENT:     Head: Normocephalic and atraumatic.  Eyes:     General: No scleral icterus. Cardiovascular:     Rate and Rhythm: Tachycardia present. Rhythm irregular.  Pulmonary:     Effort: Pulmonary effort is  normal. No respiratory distress.     Breath sounds: Normal breath sounds.  Abdominal:     General: Bowel sounds are normal.     Palpations: Abdomen is soft.  Musculoskeletal:     Right lower leg: No edema.     Left lower leg: No edema.  Skin:    General: Skin is warm and dry.     Capillary Refill: Capillary refill takes less than 2 seconds.  Neurological:     General: No focal deficit present.     Mental Status: She is alert and oriented to person, place, and time.    Data Reviewed: I have personally reviewed following labs and imaging studies  CBC: Recent Labs  Lab 07/21/24 1221 07/23/24 0224 07/24/24 0146 07/25/24 0245  WBC 8.7 9.2 8.5 7.9  NEUTROABS 6.2  --   --   --   HGB 12.4 10.7* 10.9* 11.0*  HCT 36.9 32.2* 32.2* 33.1*  MCV 92.7 92.0 92.5 92.7  PLT 255 213 194 223   Basic Metabolic Panel: Recent Labs  Lab 07/22/24 1248 07/23/24 0224 07/24/24 0146 07/25/24 0245 07/26/24 0231  NA 127* 125* 125* 127* 132*  K 3.9 3.6 4.0 4.3 4.6  CL 90* 88* 89* 93* 94*  CO2 24 25 24 27 28   GLUCOSE 173* 114* 128* 105* 109*  BUN 31* 35* 41* 50* 52*  CREATININE 2.61* 2.44* 1.87* 1.64* 1.56*  CALCIUM  8.6* 8.2* 8.0* 8.6* 8.7*  MG  --   --  1.8 1.9 2.1   GFR: Estimated Creatinine Clearance: 21.8 mL/min (A) (by C-G formula based on SCr of 1.56 mg/dL (H)). Liver Function Tests: Recent Labs  Lab 07/21/24 1221  AST 82*  ALT 58*  ALKPHOS 80  BILITOT 1.3*  PROT 6.8  ALBUMIN 3.1*   BNP (last 3 results) Recent Labs    07/21/24 1221  BNP 305.8*   Scheduled Meds:  apixaban   2.5 mg Oral BID   atorvastatin   10 mg Oral Daily   lactose free nutrition  237 mL Oral Daily   levothyroxine   50 mcg Oral QAC breakfast   metoprolol  tartrate  12.5 mg Oral BID   ondansetron   4 mg Oral TID AC & HS   pantoprazole   40 mg Oral BID   PARoxetine   20 mg Oral Daily   sodium chloride  flush  3 mL Intravenous Q12H   urea   15 g Oral Daily   Continuous Infusions:   LOS: 4 days   Time  spent: 55 minutes  Camellia Door, DO  [facility]  07/26/2024, 3:31 PM

## 2024-07-26 NOTE — Progress Notes (Signed)
 Occupational Therapy Treatment Patient Details Name: Cheryl Peterson MRN: [id number] DOB: 11-Jun-1930 Today's Date: 07/26/2024   History of present illness 89 yo female admitted 8/24 with SOB Chest xray enlargement of cardiopericardial silhouette vascular congestion and diffuse interstitial opacity suggesting interstitial edema.(+) PMH Afib HLD HTN, thoracentesis 2022 depression on Paxil  CKD III.   OT comments  Pt demonstrates max HR 152 with bathroom transfer. Pt with poor awareness and line management to oxygen demonstrated by picking up RW, rolling across tubing,and  not holding tubing, placing her finger pulse ox over her shoulder instead of the actual oxygen tube. Recommendation for RW, O2 and skilled inpatient follow up therapy, <3 hours/day.       If plan is discharge home, recommend the following:  A little help with walking and/or transfers;A little help with bathing/dressing/bathroom   Equipment Recommendations  Other (comment) (RW and O2)    Recommendations for Other Services      Precautions / Restrictions Precautions Precautions: Fall Recall of Precautions/Restrictions: Intact Precaution/Restrictions Comments: monitor O2 HR       Mobility Bed Mobility Overal bed mobility: Modified Independent             General bed mobility comments: sit to supine    Transfers Overall transfer level: Needs assistance Equipment used: Rolling walker (2 wheels) Transfers: Sit to/from Stand Sit to Stand: Min assist           General transfer comment: pt requires increased safety with RW and do not feel rollator will be safe with oxgyen management     Balance Overall balance assessment: Needs assistance         Standing balance support: Bilateral upper extremity supported, During functional activity, Reliant on assistive device for balance Standing balance-Leahy Scale: Poor                             ADL either performed or assessed with clinical  judgement   ADL Overall ADL's : Needs assistance/impaired     Grooming: Wash/dry hands;Wash/dry face;Contact guard assist;Standing Grooming Details (indicate cue type and reason): pt rubbing the inside of her nose and advised not to rub continuously to cause more bleeding                 Toilet Transfer: Minimal assistance;Ambulation;Regular Toilet;Rolling walker (2 wheels);Grab bars Toilet Transfer Details (indicate cue type and reason): pt requires Safety with lines and o2. pt rolling over O2 and then exiting pickup RW to skip over tubing showing poor safety awareness iwth carrying the RW. pt advised on o2 placement for safety Toileting- Clothing Manipulation and Hygiene: Minimal assistance       Functional mobility during ADLs: Minimal assistance;Rolling walker (2 wheels) General ADL Comments: pt requires (A) with lines throughout session. pt high fall risk with o2 alone    Extremity/Trunk Assessment Upper Extremity Assessment Upper Extremity Assessment: Overall WFL for tasks assessed   Lower Extremity Assessment Lower Extremity Assessment: Overall WFL for tasks assessed        Vision   Vision Assessment?: Wears glasses for reading   Perception     Praxis     Communication     Cognition Arousal: Alert Behavior During Therapy: WFL for tasks assessed/performed Cognition: Cognition impaired             OT - Cognition Comments: poor safety with line management pt picking up RW to avoid lines  Following commands: Intact        Cueing      Exercises      Shoulder Instructions       General Comments HR 152 max during session 2L [place] with O2 92% or better    Pertinent Vitals/ Pain       Pain Assessment Pain Assessment: No/denies pain  Home Living                                          Prior Functioning/Environment              Frequency  Min 2X/week        Progress Toward Goals  OT  Goals(current goals can now be found in the care plan section)  Progress towards OT goals: Progressing toward goals  Acute Rehab OT Goals Patient Stated Goal: to get a shock that knocks me out OT Goal Formulation: With patient/family Time For Goal Achievement: 08/05/24 Potential to Achieve Goals: Good ADL Goals Pt Will Transfer to Toilet: with supervision;regular height toilet Additional ADL Goal #1: pt will demonstrate engery conservation x2 strategies to help with oxygen saturation mod i Additional ADL Goal #2: pt will demonstrate 5 minutes of adl task with stable VSS and oxygen levels  Plan      Co-evaluation                 AM-PAC OT 6 Clicks Daily Activity     Outcome Measure   Help from another person eating meals?: None Help from another person taking care of personal grooming?: A Little Help from another person toileting, which includes using toliet, bedpan, or urinal?: A Little Help from another person bathing (including washing, rinsing, drying)?: A Little Help from another person to put on and taking off regular upper body clothing?: None Help from another person to put on and taking off regular lower body clothing?: A Little 6 Click Score: 20    End of Session Equipment Utilized During Treatment: Rolling walker (2 wheels);Oxygen  OT Visit Diagnosis: Unsteadiness on feet (R26.81);Muscle weakness (generalized) (M62.81)   Activity Tolerance Patient tolerated treatment well   Patient Left in bed;with call bell/phone within reach;with bed alarm set;with family/visitor present   Nurse Communication Mobility status;Precautions        Time: 8984-8965 OT Time Calculation (min): 19 min  Charges: OT General Charges $OT Visit: 1 Visit OT Treatments $Self Care/Home Management : 8-22 mins   Brynn, OTR/L  Acute Rehabilitation Services Office: [phone] .   Ely Molt 07/26/2024, 11:42 AM

## 2024-07-26 NOTE — Plan of Care (Signed)
  Problem: Clinical Measurements: Goal: Will remain free from infection Outcome: Progressing   Problem: Activity: Goal: Risk for activity intolerance will decrease Outcome: Progressing   Problem: Nutrition: Goal: Adequate nutrition will be maintained Outcome: Progressing   Problem: Coping: Goal: Level of anxiety will decrease Outcome: Progressing   

## 2024-07-26 NOTE — Progress Notes (Signed)
 Nephrology Follow-Up Consult note   Assessment/Recommendations: Cheryl Peterson is a/an 89 y.o. female with a past medical history significant for CKD 3, HTN, history of a flutter, pAF on Eliquis  who presents to [facility] with SOB .     Non-Oliguric AKI (improved): Likely secondary to Prerenal/early ATN.  Presented with relatively normal creatinine and significantly elevated blood pressures.  Blood pressures were quickly normalized, and worry that could contribute to possible early ATN.  Remains nonoliguric.   -Chart reviewed: (medications acceptable, does not appear to have been exposed to nephrotoxins with imaging or had episodes of significant hypotension).   -Continue to monitor daily Cr, Dose meds for GFR -Monitor Daily I/Os, Daily weight  -Maintain MAP>65 for optimal renal perfusion.  -Avoid nephrotoxic medications including NSAIDs -Use synthetic opioids (Fentanyl /Dilaudid) if needed -Currently no indication for HD  Volume Status: Appears euvolemic on exam. Based on our examination and review of available imaging, our recommendation is monitor.  Medical Problem Hyponatremia, asymptomatic, stable/improved Treating nausea could help improve sodium level Continue Urena 15 g daily Goal Na ~137 by tomorrow AM.    Recommendations conveyed to primary service.    Cheryl Peterson [facility] 07/26/2024 6:54 AM  ___________________________________________________________  CC: AKI  Interval History/Subjective: No events reported overnight.  Seen in bedside chair.  Overall stable. Nonoliguric.  Renal function & sodium improving.  Medications:  Current Facility-Administered Medications  Medication Dose Route Frequency Provider Last Rate Last Admin   acetaminophen  (TYLENOL ) tablet 650 mg  650 mg Oral Q6H PRN Patel, Ekta V, MD       Or   acetaminophen  (TYLENOL ) suppository 650 mg  650 mg Rectal Q6H PRN Patel, Ekta V, MD       apixaban  (ELIQUIS ) tablet 2.5 mg  2.5 mg Oral BID  Dorinda Homans T, MD   2.5 mg at 07/25/24 2139   atorvastatin  (LIPITOR) tablet 10 mg  10 mg Oral Daily Patel, Ekta V, MD   10 mg at 07/25/24 9040   Gerhardt's butt cream   Topical PRN Laurence Locus, DO       hydrALAZINE  (APRESOLINE ) injection 10 mg  10 mg Intravenous Q6H PRN Patel, Ekta V, MD   10 mg at 07/21/24 1757   levothyroxine  (SYNTHROID ) tablet 50 mcg  50 mcg Oral QAC breakfast Tobie Mario GAILS, MD   50 mcg at 07/25/24 9392   metoprolol  tartrate (LOPRESSOR ) tablet 12.5 mg  12.5 mg Oral BID Laurence Locus, DO       ondansetron  (ZOFRAN ) tablet 4 mg  4 mg Oral TID AC & HS Laurence Locus, DO   4 mg at 07/25/24 1232   pantoprazole  (PROTONIX ) EC tablet 40 mg  40 mg Oral BID Djan, Prince T, MD   40 mg at 07/25/24 2139   PARoxetine  (PAXIL ) tablet 20 mg  20 mg Oral Daily Patel, Ekta V, MD   20 mg at 07/25/24 0959   sodium chloride  flush (NS) 0.9 % injection 3 mL  3 mL Intravenous Q12H Tobie Mario V, MD   3 mL at 07/25/24 2140   urea  (URE-NA) oral packet 15 g  15 g Oral Daily Laurence Locus, DO   15 g at 07/25/24 9040      Review of Systems: 10 systems reviewed and negative except per interval history/subjective  Physical Exam: Vitals:   07/25/24 2350 07/26/24 0304  BP: (!) 111/57 (!) 134/55  Pulse: 60 60  Resp: 18 19  Temp: 98 F (36.7 C) 98.1 F (36.7 C)  SpO2: 98% 91%   Total I/O In: -  Out: 1400 [Urine:1400]  Intake/Output Summary (Last 24 hours) at 07/26/2024 0654 Last data filed at 07/26/2024 0304 Gross per 24 hour  Intake 120 ml  Output 1850 ml  Net -1730 ml   Constitutional: well-appearing, no acute distress ENMT: ears and nose without scars or lesions, MMM CV: normal rate, no edema Respiratory: BLBS Gastrointestinal: soft, non-tender, no palpable masses or hernias Skin: no visible lesions or rashes Psych: alert, judgement/insight appropriate, appropriate mood and affect   Test Results I personally reviewed new and old clinical labs and radiology tests Lab Results  Component Value  Date   NA 132 (L) 07/26/2024   K 4.6 07/26/2024   CL 94 (L) 07/26/2024   CO2 28 07/26/2024   BUN 52 (H) 07/26/2024   CREATININE 1.56 (H) 07/26/2024   CALCIUM  8.7 (L) 07/26/2024   ALBUMIN 3.1 (L) 07/21/2024    CBC Recent Labs  Lab 07/21/24 1221 07/23/24 0224 07/24/24 0146 07/25/24 0245  WBC 8.7 9.2 8.5 7.9  NEUTROABS 6.2  --   --   --   HGB 12.4 10.7* 10.9* 11.0*  HCT 36.9 32.2* 32.2* 33.1*  MCV 92.7 92.0 92.5 92.7  PLT 255 213 194 223

## 2024-07-26 NOTE — Progress Notes (Signed)
 Mobility Specialist Progress Note:   07/26/24 1330  Mobility  Activity Ambulated with assistance  Level of Assistance Contact guard assist, steadying assist  Assistive Device Four wheel walker  Distance Ambulated (ft) 400 ft  Activity Response Tolerated well  Mobility Referral Yes  Mobility visit 1 Mobility  Mobility Specialist Start Time (ACUTE ONLY) 1330  Mobility Specialist Stop Time (ACUTE ONLY) 1350  Mobility Specialist Time Calculation (min) (ACUTE ONLY) 20 min   Pt agreeable to mobility session. Required only CGA for safety. No over unsteadiness noted. VSS on 2LO2 throughout. Back in bed with all needs met.  Therisa Rana Mobility Specialist Please contact via SecureChat or  Rehab office at [phone]

## 2024-07-26 NOTE — Progress Notes (Signed)
 Progress Note  [name] Date of Encounter: 07/26/2024  Primary Cardiologist: Vinie JAYSON Maxcy, MD   Subjective   Patient seen examined at her bedside. She is in atrial fibrillation this morning  Inpatient Medications    Scheduled Meds:  apixaban   2.5 mg Oral BID   atorvastatin   10 mg Oral Daily   levothyroxine   50 mcg Oral QAC breakfast   metoprolol  tartrate  12.5 mg Oral BID   ondansetron   4 mg Oral TID AC & HS   pantoprazole   40 mg Oral BID   PARoxetine   20 mg Oral Daily   sodium chloride  flush  3 mL Intravenous Q12H   urea   15 g Oral Daily   Continuous Infusions:  PRN Meds: acetaminophen  **OR** acetaminophen , Gerhardt's butt cream, hydrALAZINE    Vital Signs    Vitals:   07/25/24 2350 07/26/24 0304 07/26/24 0643 07/26/24 0742  BP: (!) 111/57 (!) 134/55  (!) 120/53  Pulse: 60 60  99  Resp: 18 19  19   Temp: 98 F (36.7 C) 98.1 F (36.7 C)  98 F (36.7 C)  TempSrc: Oral Oral  Oral  SpO2: 98% 91%  90%  Weight:   68.1 kg   Height:        Intake/Output Summary (Last 24 hours) at 07/26/2024 0808 Last data filed at 07/26/2024 0657 Gross per 24 hour  Intake 120 ml  Output 2300 ml  Net -2180 ml   Filed Weights   07/23/24 2317 07/25/24 0345 07/26/24 0643  Weight: 69 kg 68.8 kg 68.1 kg    Telemetry    Atrial fibrillation with controlled ventricular rate - Personally Reviewed  ECG    Atrial fibrillation with controlled ventricular rate - Personally Reviewed  Physical Exam    General: Comfortable Head: Atraumatic, normal size  Eyes: PEERLA, EOMI  Neck: Supple, normal JVD Cardiac: Normal S1, S2; RRR; no murmurs, rubs, or gallops Lungs: Clear to auscultation bilaterally Abd: Soft, nontender, no hepatomegaly  Ext: warm, no edema Musculoskeletal: No deformities, BUE and BLE strength normal and equal Skin: Warm and dry, no rashes   Neuro: Alert and oriented to person, place, time, and situation, CNII-XII grossly intact, no focal deficits   Psych: Normal mood and affect   Labs    Chemistry Recent Labs  Lab 07/19/24 1111 07/21/24 1221 07/22/24 1248 07/24/24 0146 07/25/24 0245 07/26/24 0231  NA 139 131*   < > 125* 127* 132*  K 5.2 4.8   < > 4.0 4.3 4.6  CL 99 99   < > 89* 93* 94*  CO2 25 24   < > 24 27 28   GLUCOSE 103* 185*   < > 128* 105* 109*  BUN 20 21   < > 41* 50* 52*  CREATININE 1.18* 1.06*   < > 1.87* 1.64* 1.56*  CALCIUM  10.0 8.8*   < > 8.0* 8.6* 8.7*  PROT 6.8 6.8  --   --   --   --   ALBUMIN 4.1 3.1*  --   --   --   --   AST 25 82*  --   --   --   --   ALT 13 58*  --   --   --   --   ALKPHOS 98 80  --   --   --   --   BILITOT 0.6 1.3*  --   --   --   --   GFRNONAA  --  50*   < >  25* 29* 31*  ANIONGAP  --  8   < > 12 7 10    < > = values in this interval not displayed.     Hematology Recent Labs  Lab 07/23/24 0224 07/24/24 0146 07/25/24 0245  WBC 9.2 8.5 7.9  RBC 3.50* 3.48* 3.57*  HGB 10.7* 10.9* 11.0*  HCT 32.2* 32.2* 33.1*  MCV 92.0 92.5 92.7  MCH 30.6 31.3 30.8  MCHC 33.2 33.9 33.2  RDW 13.3 13.0 12.8  PLT 213 194 223    Cardiac EnzymesNo results for input(s): TROPONINI in the last 168 hours. No results for input(s): TROPIPOC in the last 168 hours.   BNP Recent Labs  Lab 07/21/24 1221  BNP 305.8*     DDimer No results for input(s): DDIMER in the last 168 hours.   Radiology    No results found.   Cardiac Studies  Echo   Patient Profile     89 y.o. female with history of atrial flutter, hypertension, hyperlipidemia  Assessment & Plan    Paroxysmal atrial flutter/fibrillation  Acute on chronic heart failure with preserved ejection fraction Hyponatremia   She is in atrial fibrillation this morning and rate controlled. She respond to her beta blocker- She did not receive this yesterday due to parameters. This has been adjusted appropriately. She is on Lopressor  12.5 mg daily. If she does not convert by mid day - I will add amiodarone  100 mg daily to her  regimen  She does not appear to be volume overloaded - will continue to hold off diuretics. Thankfully her kidney function continues to improve Cr 1.56 today was 1.64 yesterday.     Please keep mag above 2 and K above 4.    Defer therapy primary team for hyponatremia  For questions or updates, please contact [facility] Please consult [email or web address] for contact info under Cardiology/STEMI.      Signed, Keiarah Orlowski, DO  07/26/2024, 8:08 AM

## 2024-07-26 NOTE — Progress Notes (Signed)
 Physical Therapy Treatment Patient Details Name: Cheryl Peterson MRN: [id number] DOB: 1930-02-28 Today's Date: 07/26/2024   History of Present Illness 89 yo female admitted 8/24 with SOB Chest xray enlargement of cardiopericardial silhouette vascular congestion and diffuse interstitial opacity suggesting interstitial edema.(+) PMH Afib HLD HTN, thoracentesis 2022 depression on Paxil  CKD III.    PT Comments  Pt received in supine, agreeable to therapy session with encouragement. PTA had pt use RW as she had decreased safety with rollator brakes previous session, pt more stable with RW but still needs safety cues for UE placement prior to transfers and up to CGA for safety. Pt resistant to safety instruction and agitated when PTA asks if she has considered increased assist at home such as assisted living type setting given increased O2 needs. Pt states she would rather not use O2 and suffer the consequences than have to move or rely on more assist. Per sons (in room), pt will have initial 12 hours per day caregiver assist at ILF upon DC. Pt continues to benefit from PT services to progress toward functional mobility goals, DME rec clarified below as pt safer with 2WW than with 4WW.     If plan is discharge home, recommend the following: A little help with bathing/dressing/bathroom;Assistance with cooking/housework;Assist for transportation;A little help with walking and/or transfers   Can travel by private vehicle     Yes  Equipment Recommendations  Rolling walker (2 wheels) (pt unable to recall use of rollator brakes)    Recommendations for Other Services       Precautions / Restrictions Precautions Precautions: Fall Recall of Precautions/Restrictions: Intact Precaution/Restrictions Comments: monitor O2, HR Restrictions Weight Bearing Restrictions Per Provider Order: No     Mobility  Bed Mobility Overal bed mobility: Modified Independent             General bed mobility comments:  supine to sitting on L EOB wtihout using rail, then return to supine and to EOB once dinner tray arrived.    Transfers Overall transfer level: Needs assistance Equipment used: Rolling walker (2 wheels) Transfers: Sit to/from Stand Sit to Stand: Contact guard assist           General transfer comment: from EOB>RW and RW<>toilet using wall rail. Back to EOB with cues needed for safe UE placement and body proximity to chair/bed.    Ambulation/Gait Ambulation/Gait assistance: Contact guard assist Gait Distance (Feet): 150 Feet Assistive device: Rolling walker (2 wheels) Gait Pattern/deviations: Decreased stride length, Step-to pattern, Narrow base of support, Leaning posteriorly Gait velocity: dec     General Gait Details: pt heel walking; SpO2 92% and above on 2L O2 [place]. Cues for RW proximity, O2 line awareness, activity pacing/energy conservation. Pt resistant to conversation regarding her plan for O2 tubing. Pt states one of the guys at my ILF has a backpack he puts on for his oxygen but when asked if he uses RW, she states no, but becomes more agitated after this. Sons present in room and report they have hired caregivers for 12 hours per day to assist her with increased O2 needs, etc, pt reports she thinks it will be temporary. When PTA asked her what if you need the O2 forever? pt states I'd rather not use it and die   Stairs             Wheelchair Mobility     Tilt Bed    Modified Helling (Stroke Patients Only)       Balance Overall balance  assessment: Needs assistance Sitting-balance support: No upper extremity supported, Feet supported Sitting balance-Leahy Scale: Fair     Standing balance support: Bilateral upper extremity supported, During functional activity, Reliant on assistive device for balance Standing balance-Leahy Scale: Poor Standing balance comment: posterior instability upon standing but no LOB using RW                             Communication Communication Communication: No apparent difficulties  Cognition Arousal: Alert Behavior During Therapy: WFL for tasks assessed/performed                             Following commands: Intact      Cueing Cueing Techniques: Verbal cues, Gestural cues  Exercises      General Comments General comments (skin integrity, edema, etc.): HR 50's bpm resting and WFL with gait      Pertinent Vitals/Pain      Home Living                          Prior Function            PT Goals (current goals can now be found in the care plan section) Acute Rehab PT Goals Patient Stated Goal: Go home PT Goal Formulation: With patient Time For Goal Achievement: 08/05/24 Progress towards PT goals: Progressing toward goals    Frequency    Min 2X/week      PT Plan      Co-evaluation              AM-PAC PT 6 Clicks Mobility   Outcome Measure  Help needed turning from your back to your side while in a flat bed without using bedrails?: None Help needed moving from lying on your back to sitting on the side of a flat bed without using bedrails?: A Little Help needed moving to and from a bed to a chair (including a wheelchair)?: A Little Help needed standing up from a chair using your arms (e.g., wheelchair or bedside chair)?: A Little Help needed to walk in hospital room?: A Little Help needed climbing 3-5 steps with a railing? : A Lot 6 Click Score: 18    End of Session Equipment Utilized During Treatment: Gait belt;Oxygen Activity Tolerance: Patient tolerated treatment well Patient left: in bed;with call bell/phone within reach;with bed alarm set;Other (comment) (pt sitting EOB, sons x2 present to supervise her while she eats) Nurse Communication: Mobility status;Other (comment) (pt frustrated by discussion of O2 equipment needs and home safety) PT Visit Diagnosis: Unsteadiness on feet (R26.81);Other abnormalities of gait and mobility  (R26.89);Repeated falls (R29.6);Muscle weakness (generalized) (M62.81);History of falling (Z91.81);Difficulty in walking, not elsewhere classified (R26.2)     Time: 8376-8349 PT Time Calculation (min) (ACUTE ONLY): 27 min  Charges:    $Gait Training: 8-22 mins $Therapeutic Activity: 8-22 mins PT General Charges $$ ACUTE PT VISIT: 1 Visit                     Lindsey Demonte P., PTA Acute Rehabilitation Services Secure Chat Preferred 9a-5:30pm Office: [phone]    Connell HERO [facility] 07/26/2024, 5:12 PM

## 2024-07-26 NOTE — Progress Notes (Signed)
 Initial Nutrition Assessment  DOCUMENTATION CODES:   Non-severe (moderate) malnutrition in context of chronic illness  INTERVENTION:  Add Boost Plus po daily, each supplement provides 360 kcal and 14 grams of protein  Continue liberalized diet to 2 gram sodium (with 1.5 L fluid restriction) for wider variety of meal selections Continue Magic cup BID with meals, each supplement provides 290 kcal and 9 grams of protein  Continue MVI with minerals daily Education provided around heart failure and controlling fluid status    NUTRITION DIAGNOSIS:  Moderate Malnutrition related to chronic illness as evidenced by mild muscle depletion, mild fat depletion.  GOAL:  Patient will meet greater than or equal to 90% of their needs  MONITOR:  PO intake, Supplement acceptance, Labs, Weight trends, I & O's  REASON FOR ASSESSMENT:   Consult Assessment of nutrition requirement/status  ASSESSMENT:   Pt with past medical history  of depression on Paxil , history of hyperlipidemia atorvastatin , essential hypertension metoprolol , CKD stage III, history of UTI, atrial flutter history, and paroxysmal A-fib on Eliquis , hypothyroidism, presenting with shortness of breath.  Noted in afib this morning. Cardiology aware. Per nephrology, AKI is improved.   Average Meal Intake 8/26: 35-80% x2 documented meals 8/27: 90-100% x2 documented meals 8/28: 90% x1 documented meal  Patient consuming lunch at time of bedside assessment. Son at bedside. Ordered tomato soup and crackers. Endorses adequate appetite/intake PTA that was at baseline.   24 Hour Recall B: skips (goes downstairs to exercise) L: hamburger or soup (provided by Gery Finn) D: Boost supplement or fixes her own meal of hamburger  She reports no issues chewing or swallowing PTA. Does not like vegetables or salads. Does consume Boost supplement with 20g protein once a day or every other day. Open to receiving one while admitted.    Discussed importance of limiting fluid, hidden sources of sodium, and of taking her diuretic pill to maintain fluid status. Son states she has been educated on lowering her blood sugar and blood pressure in the past and has been successful in doing so by modifying her diet.   Admit Weight: 66.3 kg Current Weight: 68.1 kg  No edema on exam. Reports UBW around 144lbs over last year and a half. Cannot confirm via chart review, as no recent weights documented. Bowels stable.   Meds: Levothyroxine  Pantoprazole  Ure-Na  Sodium normalizing with fluid restriction. Chloride trending up. BUN trending up and Crt trending down. Blood sugars well-controlled.   Labs:  Na+ 125>127>132 (L) K+ 4.6 (wdl) Cl 89>93>94 (L) BUN 41>50>52 (H) Crt 1.87>1.64>1.56 (H) CBGs 105-109 x24 hours   NUTRITION - FOCUSED PHYSICAL EXAM:  Appears well-nourished on exam, however does have some mild muscle and fat depletions, on exam. Meets criteria for moderate malnutrition in the context of chronic illness.   Flowsheet Row Most Recent Value  Orbital Region Mild depletion  Upper Arm Region Mild depletion  Thoracic and Lumbar Region Moderate depletion  Buccal Region Mild depletion  Temple Region No depletion  Clavicle Bone Region Mild depletion  Clavicle and Acromion Bone Region Mild depletion  Scapular Bone Region Mild depletion  Dorsal Hand No depletion  Patellar Region No depletion  Anterior Thigh Region No depletion  Posterior Calf Region Mild depletion  Edema (RD Assessment) None  Hair Reviewed  Eyes Reviewed  Mouth Reviewed  Skin Reviewed  Nails Reviewed    Diet Order:   Diet Order             Diet 2 gram sodium Fluid consistency:  Thin; Fluid restriction: 1500 mL Fluid  Diet effective now            EDUCATION NEEDS:  Education needs have been addressed  Skin:  Skin Assessment: Reviewed RN Assessment  Last BM:  8/27 - type 1 x1  Height:  Ht Readings from Last 1 Encounters:  07/21/24  5' 3 (1.6 m)   Weight:  Wt Readings from Last 1 Encounters:  07/26/24 68.1 kg   Ideal Body Weight:  52.3 kg  BMI:  Body mass index is 26.59 kg/m.  Estimated Nutritional Needs:   Kcal:  1650-1850  Protein:  80-95 grams  Fluid:  1.5 L  Blair Deaner MS, RD, LDN Registered Dietitian Clinical Nutrition RD Inpatient Contact Info in Amion

## 2024-07-26 NOTE — Assessment & Plan Note (Signed)
 Nutrition Status: Nutrition Problem: Moderate Malnutrition Etiology: chronic illness Signs/Symptoms: mild muscle depletion, mild fat depletion Interventions: MVI, Boost Plus, Magic cup

## 2024-07-27 ENCOUNTER — Other Ambulatory Visit (HOSPITAL_COMMUNITY): Payer: Self-pay

## 2024-07-27 DIAGNOSIS — E871 Hypo-osmolality and hyponatremia: Secondary | ICD-10-CM | POA: Diagnosis not present

## 2024-07-27 DIAGNOSIS — N1831 Chronic kidney disease, stage 3a: Secondary | ICD-10-CM

## 2024-07-27 DIAGNOSIS — E440 Moderate protein-calorie malnutrition: Secondary | ICD-10-CM

## 2024-07-27 DIAGNOSIS — J9601 Acute respiratory failure with hypoxia: Secondary | ICD-10-CM | POA: Diagnosis not present

## 2024-07-27 DIAGNOSIS — N179 Acute kidney failure, unspecified: Secondary | ICD-10-CM | POA: Diagnosis not present

## 2024-07-27 DIAGNOSIS — I484 Atypical atrial flutter: Secondary | ICD-10-CM | POA: Diagnosis not present

## 2024-07-27 LAB — BASIC METABOLIC PANEL WITH GFR
Anion gap: 8 (ref 5–15)
BUN: 57 mg/dL — ABNORMAL HIGH (ref 8–23)
CO2: 29 mmol/L (ref 22–32)
Calcium: 8.8 mg/dL — ABNORMAL LOW (ref 8.9–10.3)
Chloride: 96 mmol/L — ABNORMAL LOW (ref 98–111)
Creatinine, Ser: 1.45 mg/dL — ABNORMAL HIGH (ref 0.44–1.00)
GFR, Estimated: 34 mL/min — ABNORMAL LOW (ref >60)
Glucose, Bld: 116 mg/dL — ABNORMAL HIGH (ref 70–99)
Potassium: 4.8 mmol/L (ref 3.5–5.1)
Sodium: 133 mmol/L — ABNORMAL LOW (ref 135–145)

## 2024-07-27 LAB — GLUCOSE, CAPILLARY: Glucose-Capillary: 95 mg/dL (ref 70–99)

## 2024-07-27 LAB — MAGNESIUM: Magnesium: 1.9 mg/dL (ref 1.7–2.4)

## 2024-07-27 MED ORDER — ONDANSETRON HCL 8 MG PO TABS
4.0000 mg | ORAL_TABLET | Freq: Three times a day (TID) | ORAL | 0 refills | Status: AC
  Filled 2024-07-27: qty 60, 30d supply, fill #0

## 2024-07-27 MED ORDER — APIXABAN 2.5 MG PO TABS
2.5000 mg | ORAL_TABLET | Freq: Two times a day (BID) | ORAL | 0 refills | Status: DC
  Filled 2024-07-27: qty 180, 90d supply, fill #0

## 2024-07-27 MED ORDER — UREA 15 G PO PACK
15.0000 g | PACK | Freq: Every day | ORAL | 0 refills | Status: AC
  Filled 2024-07-27: qty 30, 30d supply, fill #0
  Filled 2024-07-27: qty 15, 15d supply, fill #0

## 2024-07-27 MED ORDER — METOPROLOL TARTRATE 25 MG PO TABS
12.5000 mg | ORAL_TABLET | Freq: Two times a day (BID) | ORAL | 0 refills | Status: DC
  Filled 2024-07-27: qty 180, 180d supply, fill #0

## 2024-07-27 NOTE — Progress Notes (Signed)
 Progress Note  [name] Date of Encounter: 07/27/2024  Primary Cardiologist: Vinie JAYSON Maxcy, MD   Subjective   Patient seen examined at her bedside. In sinus rhythm  Inpatient Medications    Scheduled Meds:  apixaban   2.5 mg Oral BID   atorvastatin   10 mg Oral Daily   lactose free nutrition  237 mL Oral Daily   levothyroxine   50 mcg Oral QAC breakfast   metoprolol  tartrate  12.5 mg Oral BID   ondansetron   4 mg Oral TID AC & HS   pantoprazole   40 mg Oral BID   PARoxetine   20 mg Oral Daily   sodium chloride  flush  3 mL Intravenous Q12H   urea   15 g Oral Daily   Continuous Infusions:  PRN Meds: acetaminophen  **OR** acetaminophen , Gerhardt's butt cream, hydrALAZINE    Vital Signs    Vitals:   07/26/24 2337 07/27/24 0354 07/27/24 0500 07/27/24 0730  BP: (!) 113/49 (!) 116/47  (!) 113/59  Pulse: 61 (!) 58    Resp: 14 13    Temp: 98.1 F (36.7 C) 98 F (36.7 C)  98.1 F (36.7 C)  TempSrc: Oral Oral  Oral  SpO2: 98% 95%  92%  Weight:   68.8 kg   Height:        Intake/Output Summary (Last 24 hours) at 07/27/2024 0906 Last data filed at 07/27/2024 0733 Gross per 24 hour  Intake 360 ml  Output 500 ml  Net -140 ml   Filed Weights   07/25/24 0345 07/26/24 0643 07/27/24 0500  Weight: 68.8 kg 68.1 kg 68.8 kg    Telemetry    Sinus rhythm- Personally Reviewed  ECG    Sinus rhythm - Personally Reviewed  Physical Exam    General: Comfortable Head: Atraumatic, normal size  Eyes: PEERLA, EOMI  Neck: Supple, normal JVD Cardiac: Normal S1, S2; RRR; no murmurs, rubs, or gallops Lungs: Clear to auscultation bilaterally Abd: Soft, nontender, no hepatomegaly  Ext: warm, no edema Musculoskeletal: No deformities, BUE and BLE strength normal and equal Skin: Warm and dry, no rashes   Neuro: Alert and oriented to person, place, time, and situation, CNII-XII grossly intact, no focal deficits  Psych: Normal mood and affect   Labs     Chemistry Recent Labs  Lab 07/21/24 1221 07/22/24 1248 07/25/24 0245 07/26/24 0231 07/27/24 0224  NA 131*   < > 127* 132* 133*  K 4.8   < > 4.3 4.6 4.8  CL 99   < > 93* 94* 96*  CO2 24   < > 27 28 29   GLUCOSE 185*   < > 105* 109* 116*  BUN 21   < > 50* 52* 57*  CREATININE 1.06*   < > 1.64* 1.56* 1.45*  CALCIUM  8.8*   < > 8.6* 8.7* 8.8*  PROT 6.8  --   --   --   --   ALBUMIN 3.1*  --   --   --   --   AST 82*  --   --   --   --   ALT 58*  --   --   --   --   ALKPHOS 80  --   --   --   --   BILITOT 1.3*  --   --   --   --   GFRNONAA 50*   < > 29* 31* 34*  ANIONGAP 8   < > 7 10 8    < > =  values in this interval not displayed.     Hematology Recent Labs  Lab 07/23/24 0224 07/24/24 0146 07/25/24 0245  WBC 9.2 8.5 7.9  RBC 3.50* 3.48* 3.57*  HGB 10.7* 10.9* 11.0*  HCT 32.2* 32.2* 33.1*  MCV 92.0 92.5 92.7  MCH 30.6 31.3 30.8  MCHC 33.2 33.9 33.2  RDW 13.3 13.0 12.8  PLT 213 194 223    Cardiac EnzymesNo results for input(s): TROPONINI in the last 168 hours. No results for input(s): TROPIPOC in the last 168 hours.   BNP Recent Labs  Lab 07/21/24 1221  BNP 305.8*     DDimer No results for input(s): DDIMER in the last 168 hours.   Radiology    No results found.   Cardiac Studies  Echo   Patient Profile     89 y.o. female with history of atrial flutter, hypertension, hyperlipidemia  Assessment & Plan    Paroxysmal atrial flutter/fibrillation  Acute on chronic heart failure with preserved ejection fraction Hyponatremia   She is back in sinus rhythm. Continue the dose of lopressor  12.5 mg daily.  She does not appear to be volume overloaded - will continue to hold off diuretics. Thankfully her kidney function continues to improve Cr 1.45  today was 1.56 yesterday.     Please keep mag above 2 and K above 4.    Defer therapy primary team for hyponatremia   Will sign of from a CV standpoint.       Signed, Hykeem Ojeda, DO  07/27/2024, 9:06  AM

## 2024-07-27 NOTE — Progress Notes (Signed)
 Nephrology Follow-Up Consult note   Assessment/Recommendations: Cheryl Peterson is a/an 89 y.o. female with a past medical history significant for CKD 3, HTN, history of a flutter, pAF on Eliquis  who presents to [facility] with SOB .     Non-Oliguric AKI (improving): Likely secondary to Prerenal/early ATN.  Presented with relatively normal creatinine and significantly elevated blood pressures.  Blood pressures were quickly normalized, and worry that could contribute to possible early ATN.  Remains nonoliguric.   -Chart reviewed: (medications acceptable, does not appear to have been exposed to nephrotoxins with imaging or had episodes of significant hypotension).   -Continue to monitor daily Cr, Dose meds for GFR -Monitor Daily I/Os, Daily weight  -Maintain MAP>65 for optimal renal perfusion.  -Avoid nephrotoxic medications including NSAIDs -Use synthetic opioids (Fentanyl /Dilaudid) if needed -Currently no indication for HD  Volume Status: Appears euvolemic on exam. Based on our examination and review of available imaging, our recommendation is monitor.  Medical Problem Hyponatremia, asymptomatic, improved Treating nausea could help improve sodium level Continue Urena 15 g daily    Recommendations conveyed to primary service.    [name] HERO Dajuana [facility] 07/27/2024 7:42 AM  ___________________________________________________________  CC: AKI  Interval History/Subjective: No major events overnight.  Seen in bed.  Family at bedside.  Doing well.  Getting discharged.  Medications:  Current Facility-Administered Medications  Medication Dose Route Frequency Provider Last Rate Last Admin   acetaminophen  (TYLENOL ) tablet 650 mg  650 mg Oral Q6H PRN Patel, Ekta V, MD       Or   acetaminophen  (TYLENOL ) suppository 650 mg  650 mg Rectal Q6H PRN Patel, Ekta V, MD       apixaban  (ELIQUIS ) tablet 2.5 mg  2.5 mg Oral BID Dorinda Homans T, MD   2.5 mg at 07/26/24 2108    atorvastatin  (LIPITOR) tablet 10 mg  10 mg Oral Daily Patel, Ekta V, MD   10 mg at 07/26/24 9072   Gerhardt's butt cream   Topical PRN Laurence Locus, DO       hydrALAZINE  (APRESOLINE ) injection 10 mg  10 mg Intravenous Q6H PRN Patel, Ekta V, MD   10 mg at 07/21/24 1757   lactose free nutrition (BOOST PLUS) liquid 237 mL  237 mL Oral Daily Laurence Locus, DO   237 mL at 07/26/24 1821   levothyroxine  (SYNTHROID ) tablet 50 mcg  50 mcg Oral QAC breakfast Tobie Mario GAILS, MD   50 mcg at 07/27/24 9380   metoprolol  tartrate (LOPRESSOR ) tablet 12.5 mg  12.5 mg Oral BID Tobb, Kardie, DO   12.5 mg at 07/26/24 2107   ondansetron  (ZOFRAN ) tablet 4 mg  4 mg Oral TID AC & HS Laurence Locus, DO   4 mg at 07/27/24 9379   pantoprazole  (PROTONIX ) EC tablet 40 mg  40 mg Oral BID Djan, Prince T, MD   40 mg at 07/26/24 2107   PARoxetine  (PAXIL ) tablet 20 mg  20 mg Oral Daily Patel, Ekta V, MD   20 mg at 07/26/24 9071   sodium chloride  flush (NS) 0.9 % injection 3 mL  3 mL Intravenous Q12H Tobie Mario V, MD   3 mL at 07/26/24 2108   urea  (URE-NA) oral packet 15 g  15 g Oral Daily Laurence Locus, DO   15 g at 07/26/24 9072      Review of Systems: 10 systems reviewed and negative except per interval history/subjective  Physical Exam: Vitals:   07/27/24 0354 07/27/24 0730  BP: (!) 116/47 ROLLEN)  113/59  Pulse: (!) 58   Resp: 13   Temp: 98 F (36.7 C) 98.1 F (36.7 C)  SpO2: 95% 92%   Total I/O In: 240 [P.O.:240] Out: -   Intake/Output Summary (Last 24 hours) at 07/27/2024 0742 Last data filed at 07/27/2024 0733 Gross per 24 hour  Intake 360 ml  Output 500 ml  Net -140 ml   Constitutional: well-appearing, no acute distress ENMT: ears and nose without scars or lesions, MMM CV: normal rate, no edema Respiratory: BLBS Gastrointestinal: soft, non-tender, no palpable masses or hernias Skin: no visible lesions or rashes Psych: alert, judgement/insight appropriate, appropriate mood and affect   Test Results I personally  reviewed new and old clinical labs and radiology tests Lab Results  Component Value Date   NA 133 (L) 07/27/2024   K 4.8 07/27/2024   CL 96 (L) 07/27/2024   CO2 29 07/27/2024   BUN 57 (H) 07/27/2024   CREATININE 1.45 (H) 07/27/2024   CALCIUM  8.8 (L) 07/27/2024   ALBUMIN 3.1 (L) 07/21/2024    CBC Recent Labs  Lab 07/21/24 1221 07/23/24 0224 07/24/24 0146 07/25/24 0245  WBC 8.7 9.2 8.5 7.9  NEUTROABS 6.2  --   --   --   HGB 12.4 10.7* 10.9* 11.0*  HCT 36.9 32.2* 32.2* 33.1*  MCV 92.7 92.0 92.5 92.7  PLT 255 213 194 223

## 2024-07-27 NOTE — Discharge Summary (Signed)
 [facility] Physician Discharge Summary   [name]  Admit date:     07/21/2024  Discharge date: 07/27/2024  Attending Physician: DORINDA DRUE DASEN [[id number]]  Discharge Physician: Camellia Door   PCP: Loreli Elsie [name], MD  Admitted From: [facility] Disposition:  [facility]  Recommendations for Outpatient Follow-up:  Follow up with PCP in 1-2 weeks Follow up with Cardiology in 2-4 weeks  Home Health:Yes. Home PT/OT Equipment/Devices: Oxygen 2 L/min  Discharge Condition:Stable CODE STATUS:FULL Diet recommendation: Heart Healthy Fluid Restriction: None  Hospital Summary: CC: SOB x 2 weeks HPI: Cheryl Peterson is a 89 y.o. female with past medical history  of depression on Paxil , history of hyperlipidemia atorvastatin , essential hypertension metoprolol , CKD stage III, history of UTI, atrial flutter history, and paroxysmal A-fib on Eliquis , hypothyroidism, presenting today with shortness of breath.  Overall symptoms have been going on for the past few weeks but has been progressively getting worse.  Sats on initial presentation was 87% on room air patient was found to be in respiratory distress and placed on a nonrebreather.  Patient has been followed closely by cardiology and is currently wearing a Zio patch.  At bedside she is alert awake oriented gives brief history she is dyspneic and hypoxic.   Significant Events: Admitted 07/21/2024 for acute respiratory with hypoxia, acute CHF Na 131, K 4.8, CO2 of 24, BUN 21, Scr 1.06, glu 185 T prot 6.8, alb 3.1, AST 82, ALT 58, alk phos 80, T. Bili 1.3 BNP 305 Troponin I of 20 -> 13  Admission Labs: WBC 8.7, HgB 12.4, plt 255  Admission Imaging Studies: CXR Enlargement of the cardiopericardial silhouette with vascular congestion and diffuse interstitial opacity suggesting interstitial edema.  Significant Labs:   Significant Imaging Studies: 07-22-2024 echo LVEF 70%  Antibiotic  Therapy: Anti-infectives (From admission, onward)    None       Procedures:   Consultants: cardiology   Hospital Course by Problem: * SOB (shortness of breath) 07-24-2024 multifactorial cause.  From afib/flutter vs CHF vs hypoxia.   07-25-2024 remains on supplemental O2.  07-26-2024 continue on supplemental O2. Her dyspnea is multifactorial. Breathing is stable at rest with O2.  07-27-2024 stable. DC to home with home O2  AKI (acute kidney injury) (HCC) 07-24-2024 admission Scr 1.18. with diuresis, Scr increased to 2.61. diuretics held and Scr downtrending now to 1.87 today.  07-25-2024 AKI due to diuresis with IV lasix . Scr down to 1.64.   07-26-2024 Scr 1.56 today. Do not give any lasix .  07-27-2024 Scr down to 1.45. stay off diuretics. Repeat BMP in PCP office in 1-2 weeks.  Hyponatremia 07-24-2024 Na 125 today. Nephrology consult managing her sodium levels.  07-25-2024 remains on urea -Na per nephrology. Dr. Windle wants to monitor pt's Na levels in-house.  07-26-2024 Na up to 132 this AM continue urea /Na.  07-27-2024 Na stable at 133. DC to home with urea /Na 15 grams daily.   Acute hypoxic respiratory failure (HCC) 07-24-2024 qualified for home O2 @ 2L/min. Home O2 ordered. TOC aware.  07-25-2024 home O2 arranged.  07-26-2024 will go home with O2. This has already been arranged. Pt's portable concentrator already delivered to her hospital room.  07-27-2024 DC to home with O2 @ 2L/min. Pt's portable concentrator already delivered to her hospital room.  Atypical atrial flutter (HCC) 07-24-2024 cards deciding on whether to proceed with DCCV. Defer to cards to make pt NPO if needed. On Toprol  and eliquis  at renal  dosing.  07-25-2024 back in NSR. Lopressor  dose decreased to 12.5 mg bid due to repeated episodes of night time bradycardia with HR in the 40s. On Eliquis .  07-26-2024 Pt back in afib with occ RVR. Lopressor  doses have been held over last 1.5 days due  to hold parameters(hold for SBP <120 or HR <60). Cardiology has modified hold parameters. Now hold for SBP <105 or HR <60. Cards deciding if pt should start on amiodarone . Son understands reasoning for continued hospitalization.  Pt is very frustrated and wants to stop all meds and go home. Son is against this idea.  07-27-2024 back in NSR. On eliquis . DC to home with lopressor  12.5 mg bid. Discussed with cards. Dr. Sheena. She has given clearance for pt to be discharged today. F/u with cards in office in 2-4 weeks.   CKD stage 3a, GFR 45-59 ml/min (HCC) 07-24-2024 baseline scr 1.1  07-25-2024 Scr 1.64 today.  07-26-2024 scr improved to 1.56 today.  07-27-2024 Scr 1.45 today. Stay off diuretics.  Essential hypertension 07-24-2024 on lopressor  25 mg bid.  07-25-2024 Lopressor  dose decreased to 12.5 mg bid due to repeated episodes of night time bradycardia with HR in the 40s.   07-26-2024 on lopressor  12.5 mg bid. Cardiology has modified hold parameters. Now hold for SBP <105 or HR <60   07-27-2024 DC to home with lopressor  12.5 mg bid.  Malnutrition of moderate degree Nutrition Status: Nutrition Problem: Moderate Malnutrition Etiology: chronic illness Signs/Symptoms: mild muscle depletion, mild fat depletion Interventions: MVI, Boost Plus, Magic cup    Discharge Diagnoses:  Principal Problem:   SOB (shortness of breath) Active Problems:   Atypical atrial flutter (HCC)   Acute hypoxic respiratory failure (HCC)   Hyponatremia   AKI (acute kidney injury) (HCC)   Essential hypertension   CKD stage 3a, GFR 45-59 ml/min (HCC)   Malnutrition of moderate degree   Discharge Instructions  Discharge Instructions     Call MD for:  difficulty breathing, headache or visual disturbances   Complete by: As directed    Call MD for:  extreme fatigue   Complete by: As directed    Call MD for:  hives   Complete by: As directed    Call MD for:  persistant dizziness or light-headedness    Complete by: As directed    Call MD for:  persistant nausea and vomiting   Complete by: As directed    Call MD for:  redness, tenderness, or signs of infection (pain, swelling, redness, odor or green/yellow discharge around incision site)   Complete by: As directed    Call MD for:  severe uncontrolled pain   Complete by: As directed    Call MD for:  temperature >100.4   Complete by: As directed    Diet - low sodium heart healthy   Complete by: As directed    Discharge instructions   Complete by: As directed    1. Follow up with your primary care provider in 1-2 weeks following discharge from hospital. 2. Follow up with cardiology in office in 2-4 weeks. Call for appointment.   Increase activity slowly   Complete by: As directed       Allergies as of 07/27/2024       Reactions   Penicillin G Sodium Swelling, Other (See Comments)   Lip numbness and swelling- no breathing issues, though Other reaction(s): lip numbness, swelling        Medication List     TAKE these medications  apixaban  2.5 MG Tabs tablet Commonly known as: ELIQUIS  Take 1 tablet (2.5 mg total) by mouth 2 (two) times daily. What changed:  medication strength how much to take   atorvastatin  10 MG tablet Commonly known as: LIPITOR Take 10 mg by mouth daily.   Centrum Silver 50+Women Tabs Take 1 tablet by mouth daily with breakfast.   levothyroxine  50 MCG tablet Commonly known as: SYNTHROID  Take 50 mcg by mouth daily before breakfast.   metoprolol  tartrate 25 MG tablet Commonly known as: LOPRESSOR  Take 0.5 tablets (12.5 mg total) by mouth 2 (two) times daily. What changed: how much to take   ondansetron  4 MG tablet Commonly known as: ZOFRAN  Take 1 tablet (4 mg total) by mouth 4 (four) times daily -  before meals and at bedtime.   PARoxetine  20 MG tablet Commonly known as: PAXIL  Take 20 mg by mouth daily.   urea  15 g Pack oral packet Commonly known as: URE-NA Take 15 g by mouth  daily. Start taking on: July 28, 2024               Durable Medical Equipment  (From admission, onward)           Start     Ordered   07/24/24 1404  For home use only DME oxygen  Once       Question Answer Comment  Length of Need Lifetime   Mode or (Route) Mask   Liters per Minute 2   Frequency Continuous (stationary and portable oxygen unit needed)   Oxygen conserving device Yes   Oxygen delivery system Gas      07/24/24 1404   07/22/24 1450  For home use only DME 4 wheeled rolling walker with seat  Once       Question:  Patient needs a walker to treat with the following condition  Answer:  Weakness   07/22/24 1449            Follow-up Information     Tobb, Kardie, DO. Schedule an appointment as soon as possible for a visit in 2 week(s).   Specialty: Cardiology Contact information: [address] [place] [address] [phone]         Loreli Elsie [name], MD. Schedule an appointment as soon as possible for a visit in 1 week(s).   Specialty: Internal Medicine Contact information: [address] [place] [address] [phone]                Allergies  Allergen Reactions   Penicillin G Sodium Swelling and Other (See Comments)    Lip numbness and swelling- no breathing issues, though Other reaction(s): lip numbness, swelling    Discharge Exam: Vitals:   07/27/24 0354 07/27/24 0730  BP: (!) 116/47 (!) 113/59  Pulse: (!) 58   Resp: 13   Temp: 98 F (36.7 C) 98.1 F (36.7 C)  SpO2: 95% 92%    Physical Exam Vitals and nursing note reviewed.  Constitutional:      General: She is not in acute distress.    Appearance: She is not toxic-appearing or diaphoretic.  HENT:     Head: Normocephalic and atraumatic.     Nose: Nose normal.  Cardiovascular:     Rate and Rhythm: Normal rate.  Pulmonary:     Effort: Pulmonary effort is normal. No respiratory distress.     Breath sounds: Normal breath sounds.  Abdominal:      General: Bowel sounds are normal.     Palpations:  Abdomen is soft.  Musculoskeletal:     Right lower leg: No edema.     Left lower leg: No edema.  Skin:    General: Skin is warm and dry.     Capillary Refill: Capillary refill takes less than 2 seconds.  Neurological:     Mental Status: She is alert and oriented to person, place, and time.     The results of significant diagnostics from this hospitalization (including imaging, microbiology, ancillary and laboratory) are listed below for reference.    Labs: BNP (last 3 results) Recent Labs    07/21/24 1221  BNP 305.8*   Basic Metabolic Panel: Recent Labs  Lab 07/23/24 0224 07/24/24 0146 07/25/24 0245 07/26/24 0231 07/27/24 0224  NA 125* 125* 127* 132* 133*  K 3.6 4.0 4.3 4.6 4.8  CL 88* 89* 93* 94* 96*  CO2 25 24 27 28 29   GLUCOSE 114* 128* 105* 109* 116*  BUN 35* 41* 50* 52* 57*  CREATININE 2.44* 1.87* 1.64* 1.56* 1.45*  CALCIUM  8.2* 8.0* 8.6* 8.7* 8.8*  MG  --  1.8 1.9 2.1 1.9   Liver Function Tests: Recent Labs  Lab 07/21/24 1221  AST 82*  ALT 58*  ALKPHOS 80  BILITOT 1.3*  PROT 6.8  ALBUMIN 3.1*   CBC: Recent Labs  Lab 07/21/24 1221 07/23/24 0224 07/24/24 0146 07/25/24 0245  WBC 8.7 9.2 8.5 7.9  NEUTROABS 6.2  --   --   --   HGB 12.4 10.7* 10.9* 11.0*  HCT 36.9 32.2* 32.2* 33.1*  MCV 92.7 92.0 92.5 92.7  PLT 255 213 194 223   BNP: Recent Labs  Lab 07/21/24 1221  BNP 305.8*   CBG: Recent Labs  Lab 07/27/24 0242  GLUCAP 95   Sepsis Labs Recent Labs  Lab 07/21/24 1221 07/23/24 0224 07/24/24 0146 07/25/24 0245  WBC 8.7 9.2 8.5 7.9   Procedures/Studies: ECHOCARDIOGRAM COMPLETE Result Date: 07/22/2024    ECHOCARDIOGRAM REPORT   Patient Name:   HYDEIA MCATEE Date of Exam: 07/22/2024 Medical Rec #:  [id number]     Height:       63.0 in Accession #:    [id number]    Weight:       145.7 lb Date of Birth:  1930/11/28    BSA:          1.690 m Patient Age:    89 years      BP:            104/46 mmHg Patient Gender: F             HR:           52 bpm. Exam Location:  Inpatient Procedure: 2D Echo, Cardiac Doppler and Color Doppler (Both Spectral and Color            Flow Doppler were utilized during procedure). Indications:    CHF-Acute Systolic 428.21 / I50.21  History:        Patient has prior history of Echocardiogram examinations, most                 recent 11/04/2021. CKD, stage 3, Arrythmias:Atrial Fibrillation                 and Atrial Flutter, Signs/Symptoms:Chest Pain and Shortness of                 Breath; Risk Factors:Hypertension and Dyslipidemia.  Sonographer:    Thea Norlander RCS Referring Phys: TOBIE MODEST, V IMPRESSIONS  1. Left ventricular ejection fraction, by estimation, is 70 to 75%. The left ventricle has hyperdynamic function. The left ventricle has no regional wall motion abnormalities. Left ventricular diastolic parameters are indeterminate. Elevated left atrial  pressure.  2. Right ventricular systolic function is normal. The right ventricular size is normal. Tricuspid regurgitation signal is inadequate for assessing PA pressure.  3. The mitral valve is degenerative. Trivial mitral valve regurgitation. No evidence of mitral stenosis.  4. The aortic valve is tricuspid. Aortic valve regurgitation is not visualized. Aortic valve sclerosis/calcification is present, without any evidence of aortic stenosis. Aortic valve Vmax measures 1.16 m/s.  5. The inferior vena cava is dilated in size with <50% respiratory variability, suggesting right atrial pressure of 15 mmHg. FINDINGS  Left Ventricle: Left ventricular ejection fraction, by estimation, is 70 to 75%. The left ventricle has hyperdynamic function. The left ventricle has no regional wall motion abnormalities. The left ventricular internal cavity size was normal in size. There is no left ventricular hypertrophy. Left ventricular diastolic parameters are indeterminate. Elevated left atrial pressure. Right Ventricle: The right  ventricular size is normal. No increase in right ventricular wall thickness. Right ventricular systolic function is normal. Tricuspid regurgitation signal is inadequate for assessing PA pressure. Left Atrium: Left atrial size was normal in size. Right Atrium: Right atrial size was normal in size. Pericardium: There is no evidence of pericardial effusion. Mitral Valve: The mitral valve is degenerative in appearance. There is mild thickening of the mitral valve leaflet(s). There is mild calcification of the mitral valve leaflet(s). Mild to moderate mitral annular calcification. Trivial mitral valve regurgitation. No evidence of mitral valve stenosis. Tricuspid Valve: The tricuspid valve is normal in structure. Tricuspid valve regurgitation is trivial. No evidence of tricuspid stenosis. Aortic Valve: The aortic valve is tricuspid. Aortic valve regurgitation is not visualized. Aortic valve sclerosis/calcification is present, without any evidence of aortic stenosis. Aortic valve peak gradient measures 5.4 mmHg. Pulmonic Valve: The pulmonic valve was normal in structure. Pulmonic valve regurgitation is not visualized. No evidence of pulmonic stenosis. Aorta: The aortic root is normal in size and structure. Venous: The inferior vena cava is dilated in size with less than 50% respiratory variability, suggesting right atrial pressure of 15 mmHg. IAS/Shunts: No atrial level shunt detected by color flow Doppler.  LEFT VENTRICLE PLAX 2D LVIDd:         3.40 cm   Diastology LVIDs:         1.60 cm   LV e' medial:    6.96 cm/s LV PW:         0.70 cm   LV E/e' medial:  18.8 LV IVS:        0.70 cm   LV e' lateral:   7.07 cm/s LVOT diam:     2.20 cm   LV E/e' lateral: 18.5 LV SV:         84 LV SV Index:   49 LVOT Area:     3.80 cm  RIGHT VENTRICLE             IVC RV S prime:     14.10 cm/s  IVC diam: 2.20 cm TAPSE (M-mode): 2.2 cm LEFT ATRIUM             Index        RIGHT ATRIUM           Index LA diam:        3.20 cm 1.89 cm/m    RA Area:  12.30 cm LA Vol (A2C):   45.9 ml 27.16 ml/m  RA Volume:   26.50 ml  15.68 ml/m LA Vol (A4C):   47.3 ml 27.98 ml/m LA Biplane Vol: 50.8 ml 30.06 ml/m  AORTIC VALVE AV Area (Vmax): 3.21 cm AV Vmax:        116.00 cm/s AV Peak Grad:   5.4 mmHg LVOT Vmax:      97.90 cm/s LVOT Vmean:     64.800 cm/s LVOT VTI:       0.220 m  AORTA Ao Root diam: 3.10 cm Ao Asc diam:  3.20 cm MITRAL VALVE MV Area (PHT): 3.48 cm     SHUNTS MV Decel Time: 218 msec     Systemic VTI:  0.22 m MV E velocity: 131.00 cm/s  Systemic Diam: 2.20 cm MV A velocity: 72.60 cm/s MV E/A ratio:  1.80 Wilbert Bihari MD Electronically signed by Wilbert Bihari MD Signature Date/Time: 07/22/2024/5:15:04 PM    Final    DG Chest Portable 1 View Result Date: 07/21/2024 CLINICAL DATA:  Shortness of breath. EXAM: PORTABLE CHEST 1 VIEW COMPARISON:  01/03/2022 FINDINGS: The cardio pericardial silhouette is enlarged. Vascular congestion diffuse interstitial opacity suggest interstitial edema. No acute bony abnormality. Telemetry leads overlie the chest. IMPRESSION: Enlargement of the cardiopericardial silhouette with vascular congestion and diffuse interstitial opacity suggesting interstitial edema. Electronically Signed   By: Camellia Candle M.D.   On: 07/21/2024 13:38    Time coordinating discharge: 60 mins  SIGNED:  Camellia Door, DO [facility] 07/27/24, 10:51 AM

## 2024-07-27 NOTE — Progress Notes (Signed)
 PROGRESS NOTE    Cheryl Peterson  FMW:[id number] DOB: 1930-06-09 DOA: 07/21/2024 PCP: Loreli Elsie Cheryl Mickey., Cheryl Peterson  Subjective: Pt seen and examined. Spoke with son Ed via phone.  Pt remains in NSR on lopressor .  Discussed with cards. Dr. Sheena. She has given clearance for pt to be discharged today. F/u with cards in office in 2-4 weeks.   Hospital Course: CC: SOB x 2 weeks HPI: Cheryl Peterson is a 89 y.o. female with past medical history  of depression on Paxil , history of hyperlipidemia atorvastatin , essential hypertension metoprolol , CKD stage III, history of UTI, atrial flutter history, and paroxysmal A-fib on Eliquis , hypothyroidism, presenting today with shortness of breath.  Overall symptoms have been going on for the past few weeks but has been progressively getting worse.  Sats on initial presentation was 87% on room air patient was found to be in respiratory distress and placed on a nonrebreather.  Patient has been followed closely by cardiology and is currently wearing a Zio patch.  At bedside she is alert awake oriented gives brief history she is dyspneic and hypoxic.   Significant Events: Admitted 07/21/2024 for acute respiratory with hypoxia, acute CHF Na 131, K 4.8, CO2 of 24, BUN 21, Scr 1.06, glu 185 T prot 6.8, alb 3.1, AST 82, ALT 58, alk phos 80, T. Bili 1.3 BNP 305 Troponin I of 20 -> 13  Admission Labs: WBC 8.7, HgB 12.4, plt 255  Admission Imaging Studies: CXR Enlargement of the cardiopericardial silhouette with vascular congestion and diffuse interstitial opacity suggesting interstitial edema.  Significant Labs:   Significant Imaging Studies: 07-22-2024 echo LVEF 70%  Antibiotic Therapy: Anti-infectives (From admission, onward)    None       Procedures:   Consultants: cardiology    Assessment and Plan: * SOB (shortness of breath) 07-24-2024 multifactorial cause.  From afib/flutter vs CHF vs hypoxia.   07-25-2024 remains on supplemental  O2.  07-26-2024 continue on supplemental O2. Her dyspnea is multifactorial. Breathing is stable at rest with O2.  07-27-2024 stable. DC to home with home O2  AKI (acute kidney injury) (HCC) 07-24-2024 admission Scr 1.18. with diuresis, Scr increased to 2.61. diuretics held and Scr downtrending now to 1.87 today.  07-25-2024 AKI due to diuresis with IV lasix . Scr down to 1.64.   07-26-2024 Scr 1.56 today. Do not give any lasix .  07-27-2024 Scr down to 1.45. stay off diuretics. Repeat BMP in PCP office in 1-2 weeks.  Hyponatremia 07-24-2024 Na 125 today. Nephrology consult managing her sodium levels.  07-25-2024 remains on urea -Na per nephrology. Dr. Windle wants to monitor pt's Na levels in-house.  07-26-2024 Na up to 132 this AM continue urea /Na.  07-27-2024 Na stable at 133. DC to home with urea /Na 15 grams daily.   Acute hypoxic respiratory failure (HCC) 07-24-2024 qualified for home O2 @ 2L/min. Home O2 ordered. TOC aware.  07-25-2024 home O2 arranged.  07-26-2024 will go home with O2. This has already been arranged. Pt's portable concentrator already delivered to her hospital room.  07-27-2024 DC to home with O2 @ 2L/min. Pt's portable concentrator already delivered to her hospital room.  Atypical atrial flutter (HCC) 07-24-2024 cards deciding on whether to proceed with DCCV. Defer to cards to make pt NPO if needed. On Toprol  and eliquis  at renal dosing.  07-25-2024 back in NSR. Lopressor  dose decreased to 12.5 mg bid due to repeated episodes of night time bradycardia with HR in the 40s. On Eliquis .  07-26-2024 Pt back in  afib with occ RVR. Lopressor  doses have been held over last 1.5 days due to hold parameters(hold for SBP <120 or HR <60). Cardiology has modified hold parameters. Now hold for SBP <105 or HR <60. Cards deciding if pt should start on amiodarone . Son understands reasoning for continued hospitalization.  Pt is very frustrated and wants to stop all meds and go  home. Son is against this idea.  07-27-2024 back in NSR. On eliquis . DC to home with lopressor  12.5 mg bid. Discussed with cards. Dr. Sheena. She has given clearance for pt to be discharged today. F/u with cards in office in 2-4 weeks.   CKD stage 3a, GFR 45-59 ml/min (HCC) 07-24-2024 baseline scr 1.1  07-25-2024 Scr 1.64 today.  07-26-2024 scr improved to 1.56 today.  07-27-2024 Scr 1.45 today. Stay off diuretics.  Essential hypertension 07-24-2024 on lopressor  25 mg bid.  07-25-2024 Lopressor  dose decreased to 12.5 mg bid due to repeated episodes of night time bradycardia with HR in the 40s.   07-26-2024 on lopressor  12.5 mg bid. Cardiology has modified hold parameters. Now hold for SBP <105 or HR <60   07-27-2024 DC to home with lopressor  12.5 mg bid.  Malnutrition of moderate degree Nutrition Status: Nutrition Problem: Moderate Malnutrition Etiology: chronic illness Signs/Symptoms: mild muscle depletion, mild fat depletion Interventions: MVI, Boost Plus, Magic cup    DVT prophylaxis: apixaban  (ELIQUIS ) tablet 2.5 mg Start: 07/23/24 2200 apixaban  (ELIQUIS ) tablet 2.5 mg     Code Status: Full Code Family Communication: discussed via phone with pt's son Ed. Son states he has hired caregiver assistance during the day for pt. Family will provide night time support. Disposition Plan: return to independent living facility([facility]) Reason for continuing need for hospitalization: stable for DC.  Objective: Vitals:   07/26/24 2337 07/27/24 0354 07/27/24 0500 07/27/24 0730  BP: (!) 113/49 (!) 116/47  (!) 113/59  Pulse: 61 (!) 58    Resp: 14 13    Temp: 98.1 F (36.7 C) 98 F (36.7 C)  98.1 F (36.7 C)  TempSrc: Oral Oral  Oral  SpO2: 98% 95%  92%  Weight:   68.8 kg   Height:        Intake/Output Summary (Last 24 hours) at 07/27/2024 1046 Last data filed at 07/27/2024 0733 Gross per 24 hour  Intake 360 ml  Output 500 ml  Net -140 ml   Filed Weights   07/25/24  0345 07/26/24 0643 07/27/24 0500  Weight: 68.8 kg 68.1 kg 68.8 kg    Examination:  Physical Exam Vitals and nursing note reviewed.  Constitutional:      General: She is not in acute distress.    Appearance: She is not toxic-appearing or diaphoretic.  HENT:     Head: Normocephalic and atraumatic.     Nose: Nose normal.  Cardiovascular:     Rate and Rhythm: Normal rate.  Pulmonary:     Effort: Pulmonary effort is normal. No respiratory distress.     Breath sounds: Normal breath sounds.  Abdominal:     General: Bowel sounds are normal.     Palpations: Abdomen is soft.  Musculoskeletal:     Right lower leg: No edema.     Left lower leg: No edema.  Skin:    General: Skin is warm and dry.     Capillary Refill: Capillary refill takes less than 2 seconds.  Neurological:     Mental Status: She is alert and oriented to person, place, and time.  Data Reviewed: I have personally reviewed following labs and imaging studies  CBC: Recent Labs  Lab 07/21/24 1221 07/23/24 0224 07/24/24 0146 07/25/24 0245  WBC 8.7 9.2 8.5 7.9  NEUTROABS 6.2  --   --   --   HGB 12.4 10.7* 10.9* 11.0*  HCT 36.9 32.2* 32.2* 33.1*  MCV 92.7 92.0 92.5 92.7  PLT 255 213 194 223   Basic Metabolic Panel: Recent Labs  Lab 07/23/24 0224 07/24/24 0146 07/25/24 0245 07/26/24 0231 07/27/24 0224  NA 125* 125* 127* 132* 133*  K 3.6 4.0 4.3 4.6 4.8  CL 88* 89* 93* 94* 96*  CO2 25 24 27 28 29   GLUCOSE 114* 128* 105* 109* 116*  BUN 35* 41* 50* 52* 57*  CREATININE 2.44* 1.87* 1.64* 1.56* 1.45*  CALCIUM  8.2* 8.0* 8.6* 8.7* 8.8*  MG  --  1.8 1.9 2.1 1.9   GFR: Estimated Creatinine Clearance: 23.5 mL/min (A) (by C-G formula based on SCr of 1.45 mg/dL (H)). Liver Function Tests: Recent Labs  Lab 07/21/24 1221  AST 82*  ALT 58*  ALKPHOS 80  BILITOT 1.3*  PROT 6.8  ALBUMIN 3.1*   BNP (last 3 results) Recent Labs    07/21/24 1221  BNP 305.8*   CBG: Recent Labs  Lab 07/27/24 0242   GLUCAP 95   Scheduled Meds:  apixaban   2.5 mg Oral BID   atorvastatin   10 mg Oral Daily   lactose free nutrition  237 mL Oral Daily   levothyroxine   50 mcg Oral QAC breakfast   metoprolol  tartrate  12.5 mg Oral BID   ondansetron   4 mg Oral TID AC & HS   pantoprazole   40 mg Oral BID   PARoxetine   20 mg Oral Daily   sodium chloride  flush  3 mL Intravenous Q12H   urea   15 g Oral Daily   Continuous Infusions:   LOS: 5 days   Time spent: 60 minutes  Camellia Door, DO  [facility]  07/27/2024, 10:46 AM

## 2024-07-30 ENCOUNTER — Other Ambulatory Visit (HOSPITAL_COMMUNITY): Payer: Self-pay

## 2024-07-30 DIAGNOSIS — R2681 Unsteadiness on feet: Secondary | ICD-10-CM | POA: Diagnosis not present

## 2024-07-30 DIAGNOSIS — M62522 Muscle wasting and atrophy, not elsewhere classified, left upper arm: Secondary | ICD-10-CM | POA: Diagnosis not present

## 2024-07-30 DIAGNOSIS — M62521 Muscle wasting and atrophy, not elsewhere classified, right upper arm: Secondary | ICD-10-CM | POA: Diagnosis not present

## 2024-07-30 DIAGNOSIS — R488 Other symbolic dysfunctions: Secondary | ICD-10-CM | POA: Diagnosis not present

## 2024-07-31 DIAGNOSIS — R2681 Unsteadiness on feet: Secondary | ICD-10-CM | POA: Diagnosis not present

## 2024-07-31 DIAGNOSIS — M62561 Muscle wasting and atrophy, not elsewhere classified, right lower leg: Secondary | ICD-10-CM | POA: Diagnosis not present

## 2024-07-31 DIAGNOSIS — M62562 Muscle wasting and atrophy, not elsewhere classified, left lower leg: Secondary | ICD-10-CM | POA: Diagnosis not present
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
## 2024-08-02 ENCOUNTER — Ambulatory Visit: Attending: Cardiology | Admitting: Cardiology

## 2024-08-02 VITALS — BP 134/64 | HR 56 | Ht 63.0 in | Wt 152.0 lb

## 2024-08-02 DIAGNOSIS — J9601 Acute respiratory failure with hypoxia: Secondary | ICD-10-CM | POA: Diagnosis not present

## 2024-08-02 DIAGNOSIS — Z79899 Other long term (current) drug therapy: Secondary | ICD-10-CM | POA: Diagnosis not present

## 2024-08-02 DIAGNOSIS — R488 Other symbolic dysfunctions: Secondary | ICD-10-CM | POA: Diagnosis not present

## 2024-08-02 DIAGNOSIS — M62521 Muscle wasting and atrophy, not elsewhere classified, right upper arm: Secondary | ICD-10-CM | POA: Diagnosis not present

## 2024-08-02 DIAGNOSIS — M62522 Muscle wasting and atrophy, not elsewhere classified, left upper arm: Secondary | ICD-10-CM | POA: Diagnosis not present

## 2024-08-02 DIAGNOSIS — M62562 Muscle wasting and atrophy, not elsewhere classified, left lower leg: Secondary | ICD-10-CM | POA: Diagnosis not present

## 2024-08-02 DIAGNOSIS — I10 Essential (primary) hypertension: Secondary | ICD-10-CM

## 2024-08-02 DIAGNOSIS — I480 Paroxysmal atrial fibrillation: Secondary | ICD-10-CM | POA: Diagnosis not present

## 2024-08-02 DIAGNOSIS — I484 Atypical atrial flutter: Secondary | ICD-10-CM | POA: Diagnosis not present

## 2024-08-02 DIAGNOSIS — R2681 Unsteadiness on feet: Secondary | ICD-10-CM | POA: Diagnosis not present

## 2024-08-02 DIAGNOSIS — E871 Hypo-osmolality and hyponatremia: Secondary | ICD-10-CM

## 2024-08-02 DIAGNOSIS — M62561 Muscle wasting and atrophy, not elsewhere classified, right lower leg: Secondary | ICD-10-CM | POA: Diagnosis not present

## 2024-08-02 NOTE — Patient Instructions (Signed)
 Medication Instructions:   Your physician recommends that you continue on your current medications as directed. Please refer to the Current Medication list given to you today.   *If you need a refill on your cardiac medications before your next appointment, please call your pharmacy*    Lab Work:   PLEASE GO DOWN STAIRS  [facility]  FIRST FLOOR   ( GET OFF ELEVATORS WALK TOWARDS WAITING AREA LAB LOCATED BY PHARMACY):  BMET AND CBC TODAY      If you have labs (blood work) drawn today and your tests are completely normal, you will receive your results only by: MyChart Message (if you have MyChart) OR A paper copy in the mail If you have any lab test that is abnormal or we need to change your treatment, we will call you to review the results.    Testing/Procedures: NONE ORDERED  TODAY   Follow-Up: At [facility], you and your health needs are our priority.  As part of our continuing mission to provide you with exceptional heart care, our providers are all part of one team.  This team includes your primary Cardiologist (physician) and Advanced Practice Providers or APPs (Physician Assistants and Nurse Practitioners) who all work together to provide you with the care you need, when you need it.  Your next appointment:    8 week(s)   Provider:  Katlyn West, NP         We recommend signing up for the patient portal called MyChart.  Sign up information is provided on this After Visit Summary.  MyChart is used to connect with patients for Virtual Visits (Telemedicine).  Patients are able to view lab/test results, encounter notes, upcoming appointments, etc.  Non-urgent messages can be sent to your provider as well.   To learn more about what you can do with MyChart, go to [email or web address].   Other Instructions

## 2024-08-03 ENCOUNTER — Encounter: Payer: Self-pay | Admitting: Cardiology

## 2024-08-03 LAB — BASIC METABOLIC PANEL WITH GFR
BUN/Creatinine Ratio: 26 (ref 12–28)
BUN: 35 mg/dL (ref 10–36)
CO2: 25 mmol/L (ref 20–29)
Calcium: 9.4 mg/dL (ref 8.7–10.3)
Chloride: 92 mmol/L — ABNORMAL LOW (ref 96–106)
Creatinine, Ser: 1.37 mg/dL — ABNORMAL HIGH (ref 0.57–1.00)
Glucose: 107 mg/dL — ABNORMAL HIGH (ref 70–99)
Potassium: 5.3 mmol/L — ABNORMAL HIGH (ref 3.5–5.2)
Sodium: 132 mmol/L — ABNORMAL LOW (ref 134–144)
eGFR: 36 mL/min/1.73 — ABNORMAL LOW (ref >59)

## 2024-08-03 LAB — CBC
Hematocrit: 34.7 % (ref 34.0–46.6)
Hemoglobin: 11.3 g/dL (ref 11.1–15.9)
MCH: 31.5 pg (ref 26.6–33.0)
MCHC: 32.6 g/dL (ref 31.5–35.7)
MCV: 97 fL (ref 79–97)
Platelets: 254 x10E3/uL (ref 150–450)
RBC: 3.59 x10E6/uL — ABNORMAL LOW (ref 3.77–5.28)
RDW: 12.1 % (ref 11.7–15.4)
WBC: 7.9 x10E3/uL (ref 3.4–10.8)

## 2024-08-04 ENCOUNTER — Other Ambulatory Visit: Payer: Self-pay

## 2024-08-04 ENCOUNTER — Inpatient Hospital Stay (HOSPITAL_COMMUNITY)
Admission: EM | Admit: 2024-08-04 | Discharge: 2024-08-08 | DRG: 189 | Disposition: A | Discharge status: Home Health | Source: Skilled Nursing Facility | Attending: Internal Medicine | Admitting: Internal Medicine

## 2024-08-04 ENCOUNTER — Ambulatory Visit: Payer: Self-pay | Admitting: Cardiology

## 2024-08-04 ENCOUNTER — Encounter (HOSPITAL_COMMUNITY): Payer: Self-pay

## 2024-08-04 ENCOUNTER — Emergency Department (HOSPITAL_COMMUNITY)

## 2024-08-04 DIAGNOSIS — Z79890 Hormone replacement therapy (postmenopausal): Secondary | ICD-10-CM

## 2024-08-04 DIAGNOSIS — R001 Bradycardia, unspecified: Secondary | ICD-10-CM | POA: Diagnosis not present

## 2024-08-04 DIAGNOSIS — N1832 Chronic kidney disease, stage 3b: Secondary | ICD-10-CM | POA: Diagnosis present

## 2024-08-04 DIAGNOSIS — R0603 Acute respiratory distress: Secondary | ICD-10-CM | POA: Diagnosis not present

## 2024-08-04 DIAGNOSIS — J189 Pneumonia, unspecified organism: Secondary | ICD-10-CM | POA: Diagnosis present

## 2024-08-04 DIAGNOSIS — J9621 Acute and chronic respiratory failure with hypoxia: Principal | ICD-10-CM | POA: Diagnosis present

## 2024-08-04 DIAGNOSIS — J810 Acute pulmonary edema: Principal | ICD-10-CM

## 2024-08-04 DIAGNOSIS — E871 Hypo-osmolality and hyponatremia: Secondary | ICD-10-CM | POA: Diagnosis not present

## 2024-08-04 DIAGNOSIS — I5032 Chronic diastolic (congestive) heart failure: Secondary | ICD-10-CM | POA: Diagnosis present

## 2024-08-04 DIAGNOSIS — Z87440 Personal history of urinary (tract) infections: Secondary | ICD-10-CM | POA: Diagnosis not present

## 2024-08-04 DIAGNOSIS — Z1152 Encounter for screening for COVID-19: Secondary | ICD-10-CM

## 2024-08-04 DIAGNOSIS — E875 Hyperkalemia: Secondary | ICD-10-CM

## 2024-08-04 DIAGNOSIS — Z66 Do not resuscitate: Secondary | ICD-10-CM | POA: Diagnosis present

## 2024-08-04 DIAGNOSIS — Z880 Allergy status to penicillin: Secondary | ICD-10-CM

## 2024-08-04 DIAGNOSIS — Z79899 Other long term (current) drug therapy: Secondary | ICD-10-CM | POA: Diagnosis not present

## 2024-08-04 DIAGNOSIS — Z8249 Family history of ischemic heart disease and other diseases of the circulatory system: Secondary | ICD-10-CM | POA: Diagnosis not present

## 2024-08-04 DIAGNOSIS — E785 Hyperlipidemia, unspecified: Secondary | ICD-10-CM | POA: Diagnosis present

## 2024-08-04 DIAGNOSIS — I480 Paroxysmal atrial fibrillation: Secondary | ICD-10-CM | POA: Diagnosis present

## 2024-08-04 DIAGNOSIS — Z515 Encounter for palliative care: Secondary | ICD-10-CM

## 2024-08-04 DIAGNOSIS — I2489 Other forms of acute ischemic heart disease: Secondary | ICD-10-CM | POA: Diagnosis not present

## 2024-08-04 DIAGNOSIS — E039 Hypothyroidism, unspecified: Secondary | ICD-10-CM | POA: Diagnosis present

## 2024-08-04 DIAGNOSIS — R059 Cough, unspecified: Secondary | ICD-10-CM | POA: Diagnosis not present

## 2024-08-04 DIAGNOSIS — N179 Acute kidney failure, unspecified: Secondary | ICD-10-CM | POA: Diagnosis present

## 2024-08-04 DIAGNOSIS — I484 Atypical atrial flutter: Secondary | ICD-10-CM | POA: Diagnosis not present

## 2024-08-04 DIAGNOSIS — Z87891 Personal history of nicotine dependence: Secondary | ICD-10-CM | POA: Diagnosis not present

## 2024-08-04 DIAGNOSIS — J811 Chronic pulmonary edema: Secondary | ICD-10-CM | POA: Diagnosis not present

## 2024-08-04 DIAGNOSIS — R0602 Shortness of breath: Secondary | ICD-10-CM | POA: Diagnosis not present

## 2024-08-04 DIAGNOSIS — N1831 Chronic kidney disease, stage 3a: Secondary | ICD-10-CM | POA: Diagnosis not present

## 2024-08-04 DIAGNOSIS — R54 Age-related physical debility: Secondary | ICD-10-CM | POA: Diagnosis present

## 2024-08-04 DIAGNOSIS — Z789 Other specified health status: Secondary | ICD-10-CM | POA: Diagnosis not present

## 2024-08-04 DIAGNOSIS — N189 Chronic kidney disease, unspecified: Secondary | ICD-10-CM | POA: Diagnosis not present

## 2024-08-04 DIAGNOSIS — I10 Essential (primary) hypertension: Secondary | ICD-10-CM | POA: Diagnosis not present

## 2024-08-04 DIAGNOSIS — Z7901 Long term (current) use of anticoagulants: Secondary | ICD-10-CM | POA: Diagnosis not present

## 2024-08-04 DIAGNOSIS — I130 Hypertensive heart and chronic kidney disease with heart failure and stage 1 through stage 4 chronic kidney disease, or unspecified chronic kidney disease: Secondary | ICD-10-CM | POA: Diagnosis present

## 2024-08-04 DIAGNOSIS — Z7189 Other specified counseling: Secondary | ICD-10-CM | POA: Diagnosis not present

## 2024-08-04 DIAGNOSIS — T501X5A Adverse effect of loop [high-ceiling] diuretics, initial encounter: Secondary | ICD-10-CM | POA: Diagnosis present

## 2024-08-04 DIAGNOSIS — J984 Other disorders of lung: Secondary | ICD-10-CM | POA: Diagnosis not present

## 2024-08-04 DIAGNOSIS — R0902 Hypoxemia: Secondary | ICD-10-CM

## 2024-08-04 DIAGNOSIS — F32A Depression, unspecified: Secondary | ICD-10-CM | POA: Diagnosis present

## 2024-08-04 LAB — I-STAT ARTERIAL BLOOD GAS, ED
Acid-Base Excess: 1 mmol/L (ref 0.0–2.0)
Acid-Base Excess: 1 mmol/L (ref 0.0–2.0)
Bicarbonate: 26.7 mmol/L (ref 20.0–28.0)
Bicarbonate: 27.4 mmol/L (ref 20.0–28.0)
Calcium, Ion: 1.22 mmol/L (ref 1.15–1.40)
Calcium, Ion: 1.22 mmol/L (ref 1.15–1.40)
HCT: 33 % — ABNORMAL LOW (ref 36.0–46.0)
HCT: 39 % (ref 36.0–46.0)
Hemoglobin: 11.2 g/dL — ABNORMAL LOW (ref 12.0–15.0)
Hemoglobin: 13.3 g/dL (ref 12.0–15.0)
O2 Saturation: 94 %
O2 Saturation: 95 %
Patient temperature: 98.7
Patient temperature: 98.7
Potassium: 4.1 mmol/L (ref 3.5–5.1)
Potassium: 4.2 mmol/L (ref 3.5–5.1)
Sodium: 131 mmol/L — ABNORMAL LOW (ref 135–145)
Sodium: 131 mmol/L — ABNORMAL LOW (ref 135–145)
TCO2: 28 mmol/L (ref 22–32)
TCO2: 29 mmol/L (ref 22–32)
pCO2 arterial: 47.2 mmHg (ref 32–48)
pCO2 arterial: 48.2 mmHg — ABNORMAL HIGH (ref 32–48)
pH, Arterial: 7.352 (ref 7.35–7.45)
pH, Arterial: 7.372 (ref 7.35–7.45)
pO2, Arterial: 74 mmHg — ABNORMAL LOW (ref 83–108)
pO2, Arterial: 79 mmHg — ABNORMAL LOW (ref 83–108)

## 2024-08-04 LAB — COMPREHENSIVE METABOLIC PANEL WITH GFR
ALT: 23 U/L (ref 0–44)
AST: 32 U/L (ref 15–41)
Albumin: 3.1 g/dL — ABNORMAL LOW (ref 3.5–5.0)
Alkaline Phosphatase: 79 U/L (ref 38–126)
Anion gap: 11 (ref 5–15)
BUN: 47 mg/dL — ABNORMAL HIGH (ref 8–23)
CO2: 25 mmol/L (ref 22–32)
Calcium: 8.7 mg/dL — ABNORMAL LOW (ref 8.9–10.3)
Chloride: 95 mmol/L — ABNORMAL LOW (ref 98–111)
Creatinine, Ser: 1.38 mg/dL — ABNORMAL HIGH (ref 0.44–1.00)
GFR, Estimated: 36 mL/min — ABNORMAL LOW (ref >60)
Glucose, Bld: 180 mg/dL — ABNORMAL HIGH (ref 70–99)
Potassium: 4.2 mmol/L (ref 3.5–5.1)
Sodium: 131 mmol/L — ABNORMAL LOW (ref 135–145)
Total Bilirubin: 0.9 mg/dL (ref 0.0–1.2)
Total Protein: 6.8 g/dL (ref 6.5–8.1)

## 2024-08-04 LAB — CBC
HCT: 35.4 % — ABNORMAL LOW (ref 36.0–46.0)
Hemoglobin: 11.5 g/dL — ABNORMAL LOW (ref 12.0–15.0)
MCH: 31.3 pg (ref 26.0–34.0)
MCHC: 32.5 g/dL (ref 30.0–36.0)
MCV: 96.2 fL (ref 80.0–100.0)
Platelets: 289 K/uL (ref 150–400)
RBC: 3.68 MIL/uL — ABNORMAL LOW (ref 3.87–5.11)
RDW: 12.7 % (ref 11.5–15.5)
WBC: 21.1 K/uL — ABNORMAL HIGH (ref 4.0–10.5)
nRBC: 0 % (ref 0.0–0.2)

## 2024-08-04 LAB — RESP PANEL BY RT-PCR (RSV, FLU A&B, COVID)  RVPGX2
Influenza A by PCR: NEGATIVE
Influenza B by PCR: NEGATIVE
Resp Syncytial Virus by PCR: NEGATIVE
SARS Coronavirus 2 by RT PCR: NEGATIVE

## 2024-08-04 LAB — I-STAT CG4 LACTIC ACID, ED: Lactic Acid, Venous: 2 mmol/L (ref 0.5–1.9)

## 2024-08-04 LAB — TROPONIN I (HIGH SENSITIVITY)
Troponin I (High Sensitivity): 37 ng/L — ABNORMAL HIGH (ref <18)
Troponin I (High Sensitivity): 220 ng/L (ref <18)

## 2024-08-04 LAB — I-STAT CHEM 8, ED
BUN: 46 mg/dL — ABNORMAL HIGH (ref 8–23)
Calcium, Ion: 1.18 mmol/L (ref 1.15–1.40)
Chloride: 96 mmol/L — ABNORMAL LOW (ref 98–111)
Creatinine, Ser: 1.3 mg/dL — ABNORMAL HIGH (ref 0.44–1.00)
Glucose, Bld: 182 mg/dL — ABNORMAL HIGH (ref 70–99)
HCT: 37 % (ref 36.0–46.0)
Hemoglobin: 12.6 g/dL (ref 12.0–15.0)
Potassium: 4.2 mmol/L (ref 3.5–5.1)
Sodium: 133 mmol/L — ABNORMAL LOW (ref 135–145)
TCO2: 26 mmol/L (ref 22–32)

## 2024-08-04 LAB — BRAIN NATRIURETIC PEPTIDE: B Natriuretic Peptide: 363.5 pg/mL — ABNORMAL HIGH (ref 0.0–100.0)

## 2024-08-04 MED ORDER — FUROSEMIDE 10 MG/ML IJ SOLN
20.0000 mg | Freq: Once | INTRAMUSCULAR | Status: AC
  Administered 2024-08-04: 20 mg via INTRAVENOUS
  Filled 2024-08-04: qty 2

## 2024-08-04 MED ORDER — SODIUM CHLORIDE 0.9 % IV SOLN
2.0000 g | Freq: Once | INTRAVENOUS | Status: AC
  Administered 2024-08-04: 2 g via INTRAVENOUS
  Filled 2024-08-04: qty 20

## 2024-08-04 MED ORDER — SODIUM CHLORIDE 0.9 % IV SOLN
500.0000 mg | Freq: Once | INTRAVENOUS | Status: AC
  Administered 2024-08-04: 500 mg via INTRAVENOUS
  Filled 2024-08-04: qty 5

## 2024-08-04 MED ORDER — ATORVASTATIN CALCIUM 10 MG PO TABS
10.0000 mg | ORAL_TABLET | Freq: Every day | ORAL | Status: DC
  Administered 2024-08-05 – 2024-08-08 (×4): 10 mg via ORAL
  Filled 2024-08-04 (×4): qty 1

## 2024-08-04 MED ORDER — ACETAMINOPHEN 500 MG PO TABS: 500.0000 mg | ORAL_TABLET | Freq: Four times a day (QID) | ORAL | Status: DC | PRN

## 2024-08-04 MED ORDER — NITROGLYCERIN IN D5W 200-5 MCG/ML-% IV SOLN: 50.0000 ug/min | INTRAVENOUS | Status: DC

## 2024-08-04 MED ORDER — ASPIRIN 81 MG PO CHEW
324.0000 mg | CHEWABLE_TABLET | Freq: Once | ORAL | Status: AC
  Administered 2024-08-04: 324 mg via ORAL
  Filled 2024-08-04: qty 4

## 2024-08-04 MED ORDER — ACETAMINOPHEN 650 MG RE SUPP: 650.0000 mg | Freq: Four times a day (QID) | RECTAL | Status: DC | PRN

## 2024-08-04 MED ORDER — APIXABAN 2.5 MG PO TABS
2.5000 mg | ORAL_TABLET | Freq: Two times a day (BID) | ORAL | Status: DC
  Administered 2024-08-04 – 2024-08-08 (×8): 2.5 mg via ORAL
  Filled 2024-08-04 (×8): qty 1

## 2024-08-04 MED ORDER — LEVOTHYROXINE SODIUM 50 MCG PO TABS
50.0000 ug | ORAL_TABLET | Freq: Every day | ORAL | Status: DC
  Administered 2024-08-05 – 2024-08-08 (×4): 50 ug via ORAL
  Filled 2024-08-04 (×4): qty 1

## 2024-08-04 MED ORDER — ONDANSETRON HCL 4 MG PO TABS: 4.0000 mg | ORAL_TABLET | Freq: Four times a day (QID) | ORAL | Status: DC | PRN

## 2024-08-04 MED ORDER — PAROXETINE HCL 20 MG PO TABS
20.0000 mg | ORAL_TABLET | Freq: Every day | ORAL | Status: DC
  Administered 2024-08-05 – 2024-08-08 (×4): 20 mg via ORAL
  Filled 2024-08-04 (×4): qty 1

## 2024-08-04 MED ORDER — NITROGLYCERIN IN D5W 200-5 MCG/ML-% IV SOLN
INTRAVENOUS | Status: AC
  Administered 2024-08-04: 50 ug/min via INTRAVENOUS
  Filled 2024-08-04: qty 250

## 2024-08-04 MED ORDER — MELATONIN 3 MG PO TABS
3.0000 mg | ORAL_TABLET | Freq: Every evening | ORAL | Status: DC | PRN
  Administered 2024-08-06 – 2024-08-07 (×2): 3 mg via ORAL
  Filled 2024-08-04 (×2): qty 1

## 2024-08-04 MED ORDER — ONDANSETRON HCL 4 MG/2ML IJ SOLN: 4.0000 mg | Freq: Four times a day (QID) | INTRAMUSCULAR | Status: DC | PRN

## 2024-08-04 MED ORDER — METOPROLOL TARTRATE 12.5 MG HALF TABLET
12.5000 mg | ORAL_TABLET | Freq: Two times a day (BID) | ORAL | Status: DC
  Administered 2024-08-04 – 2024-08-05 (×2): 12.5 mg via ORAL
  Filled 2024-08-04 (×2): qty 1

## 2024-08-04 NOTE — ED Provider Notes (Cosign Needed)
 [place] EMERGENCY DEPARTMENT AT [facility] Provider Note HPI Cheryl Peterson is a 89 y.o. female with a medical history of depression on Paxil , HLD, HTN, CKD stage III, history of UTI, atrial flutter/paroxysmal A-fib on Eliquis , hypothyroidism who presents with acute onset shortness of breath and productive cough.  EMS was called and found her hypoxic to the 50s.  They gave her 400 of sublingual nitrogen x 2 and placed her on CPAP with improvement of her work of breathing and hypertension.  Patient denies chest pain.  Past Medical History:  Diagnosis Date   A-fib (HCC)    Chest pain    Decreased diffusion capacity    Hyperlipidemia    Hypertension    SOB (shortness of breath)    Past Surgical History:  Procedure Laterality Date   IR THORACENTESIS ASP PLEURAL SPACE W/IMG GUIDE  11/17/2021   IR THORACENTESIS ASP PLEURAL SPACE W/IMG GUIDE  11/23/2021   TONSILLECTOMY  1939   VESICOVAGINAL FISTULA CLOSURE W/ TAH  10/28/84    Review of Systems Pertinent positives and negative findings are listed as part of the History of Present Illness and MDM  Physical Exam Vitals:   08/04/24 2215 08/04/24 2220 08/04/24 2225 08/04/24 2300  BP: 106/81 125/76 (!) 119/55 (!) 118/53  Pulse: 73 75 74 75  Resp: (!) 26 17 18 20   Temp:      SpO2: 96% 95% 94% 96%     Constitutional Nursing notes reviewed Vital signs reviewed  HEENT No obvious trauma Pupils cross midline Neck supple  Respiratory Tachypnea and hypoxia requiring BiPAP Diffuse Rales  CV Normal rate and rhythm   Abdomen Soft, non-tender, non-distended No peritonitis  MSK Atraumatic No obvious deformity  Neuro Awake and alert Pupils cross midline Moving all extremities    MDM:  Initial Differential Diagnoses includes flash pulmonary edema, pneumonia, acute heart failure, asthma/COPD, pulmonary embolism  I reviewed the patient's vitals, the nursing triage note and evaluated the patient at bedside.  Patient presents for  sudden onset shortness of breath that occurred earlier today.  EMS was called and found her hypoxic to the 50s.  They gave her 400 mcg of sublingual nitroglycerin  x 2 and put her on CPAP with improvement of her symptoms.  She was hypertensive to systolics greater than 200 with EMS.    I reviewed the patient's external records which show the patient was recently discharged from the hospital on 8/30 after admission for hypoxia attributed to CHF exacerbation.  Echo at that time showed an LVEF of 70-75% with normal RV systolic function.  On arrival to our ED, she was transferred to our BiPAP machine and has improved work of breathing. she was initially hypertensive to 160s to 170s.  Bilateral crackles.  Chest x-ray shows concern for pulmonary edema versus multifocal pneumonia.  History is concerning for flash pulmonary edema given her initial hypertension and shortness of breath that improved with nitroglycerin  and BiPAP.  No wheezing or prolonged expiratory phase to suggest asthma/COPD.  Patient was given 20 of IV Lasix .  Systolic blood pressure improved to 120s without further intervention  I interpreted the patient's labs.  She has a white count of 21 and the patient was started on broad-spectrum antibiotics due to concern for multifocal pneumonia.  I did a bedside echo shows preserved LVEF with no evidence of right heart strain, lowering my suspicion for new onset heart failure or pulmonary embolism.  I considered ACS and reviewed/interpreted the EKG. EKG shows normal sinus rhythm  with no evidence of acute ischemic changes or high grade conduction block. There is no STEMI or contiguous T wave inversions. No evidence of new-onset arrythmia. The PR, QRS, and QT intervals are normal.  Initial troponin of 37.  In the absence of chest pain and a likely other etiology for her shortness of breath, I have lower suspicion for ACS  Patient admitted for further workup and  care   Procedures: Procedures  Medications administered in the ED: Medications  nitroGLYCERIN  50 mg in dextrose  5 % 250 mL (0.2 mg/mL) infusion (0 mcg/min Intravenous Stopped 08/04/24 1939)  apixaban  (ELIQUIS ) tablet 2.5 mg (has no administration in time range)  acetaminophen  (TYLENOL ) tablet 500 mg (has no administration in time range)    Or  acetaminophen  (TYLENOL ) suppository 650 mg (has no administration in time range)  ondansetron  (ZOFRAN ) tablet 4 mg (has no administration in time range)    Or  ondansetron  (ZOFRAN ) injection 4 mg (has no administration in time range)  melatonin tablet 3 mg (has no administration in time range)  atorvastatin  (LIPITOR) tablet 10 mg (has no administration in time range)  metoprolol  tartrate (LOPRESSOR ) tablet 12.5 mg (has no administration in time range)  PARoxetine  (PAXIL ) tablet 20 mg (has no administration in time range)  levothyroxine  (SYNTHROID ) tablet 50 mcg (has no administration in time range)  aspirin  chewable tablet 324 mg (has no administration in time range)  furosemide  (LASIX ) injection 20 mg (20 mg Intravenous Given 08/04/24 2016)  cefTRIAXone  (ROCEPHIN ) 2 g in sodium chloride  0.9 % 100 mL IVPB (0 g Intravenous Stopped 08/04/24 2039)  azithromycin  (ZITHROMAX ) 500 mg in sodium chloride  0.9 % 250 mL IVPB (0 mg Intravenous Stopped 08/04/24 2142)     Impression: 1. Flash pulmonary edema (HCC)   2. Multifocal pneumonia   3. Hypoxia      Patient's presentation is most consistent with acute presentation with potential threat to life or bodily function.  Disposition: ED Disposition:  Admit      ED Discharge Orders     None             Dionisio Blunt, MD 08/04/24 2338    CRITICAL CARE Performed by: Lamar JAYSON Shan   Total critical care time: 45 minutes  Critical care time was exclusive of separately billable procedures and treating other patients.  Critical care was necessary to treat or prevent imminent or  life-threatening deterioration.  Critical care was time spent personally by me on the following activities: development of treatment plan with patient and/or surrogate as well as nursing, discussions with consultants, evaluation of patient's response to treatment, examination of patient, obtaining history from patient or surrogate, ordering and performing treatments and interventions, ordering and review of laboratory studies, ordering and review of radiographic studies, pulse oximetry and re-evaluation of patient's condition.   Shan Lamar JAYSON, MD 08/10/24 2347    Shan Lamar JAYSON, MD 08/10/24 [phone]

## 2024-08-04 NOTE — H&P (Incomplete)
 History and Physical    Patient: Cheryl Peterson DOB: 10-Oct-1930 DOA: 08/04/2024 DOS: the patient was seen and examined on 08/04/2024 PCP: Loreli Elsie [name], MD  Patient coming from: Home  Chief Complaint:  Chief Complaint  Patient presents with   Respiratory Distress   HPI: Cheryl Peterson is a 89 y.o. female with medical history significant for CKD stage III, hypertension on metoprolol , depression on Paxil , hyperlipidemia on atorvastatin , paroxysmal atrial fibrillation/ flutter on Eliquis , and hypothyroidism who presents with acute onset severe shortness of breath.  The patient was just discharged from the hospital a week ago.  She was treated for shortness of breath possibly due to CHF at that time.  She was discharged on 2 L O2 nasal cannula. She developed AKI after diuresis and was discharged off Lasix . The patient reports that she was doing well since discharge until today.  She could not get her breath at all and she thought she was going tot die.  In the emergency department the patient was in distress.  She required CPAP to help settle her breathing down.  By the time of my evaluation however the patient went was ready to take a break from CPAP.  She was able to tell me how scared she was during her acute shortness of breath today.  She denies any chest pain or fever.    Review of Systems: As mentioned in the history of present illness. All other systems reviewed and are negative. Past Medical History:  Diagnosis Date   A-fib (HCC)    Chest pain    Decreased diffusion capacity    Hyperlipidemia    Hypertension    SOB (shortness of breath)    Past Surgical History:  Procedure Laterality Date   IR THORACENTESIS ASP PLEURAL SPACE W/IMG GUIDE  11/17/2021   IR THORACENTESIS ASP PLEURAL SPACE W/IMG GUIDE  11/23/2021   TONSILLECTOMY  1939   VESICOVAGINAL FISTULA CLOSURE W/ TAH  10/28/84   Social History:  reports that she quit smoking about 45 years ago. Her smoking  use included cigarettes. She has never used smokeless tobacco. She reports that she does not drink alcohol  and does not use drugs.  Allergies  Allergen Reactions   Penicillin G Sodium Swelling and Other (See Comments)    Lip numbness and swelling- no breathing issues, though Other reaction(s): lip numbness, swelling    Family History  Problem Relation Age of Onset   Stroke Mother    Hypertension Mother    Deep vein thrombosis Father    Clotting disorder Sister    Heart disease Sister    Heart failure Sister    Clotting disorder Brother    Heart disease Brother    Heart failure Brother    Colon cancer Neg Hx     Prior to Admission medications   Medication Sig Start Date End Date Taking? Authorizing Provider  apixaban  (ELIQUIS ) 2.5 MG TABS tablet Take 1 tablet (2.5 mg total) by mouth 2 (two) times daily. 07/27/24 10/25/24  Laurence Locus, DO  atorvastatin  (LIPITOR) 10 MG tablet Take 10 mg by mouth daily.    [provider]  levothyroxine  (SYNTHROID ) 50 MCG tablet Take 50 mcg by mouth daily before breakfast.    [provider]  metoprolol  tartrate (LOPRESSOR ) 25 MG tablet Take 0.5 tablets (12.5 mg total) by mouth 2 (two) times daily. 07/27/24 10/25/24  Laurence Locus, DO  Multiple Vitamins-Minerals (CENTRUM SILVER 50+WOMEN) TABS Take 1 tablet by mouth daily with breakfast.  [provider]  ondansetron  (ZOFRAN ) 8 MG tablet Take 0.5 tablets (4 mg total) by mouth 4 (four) times daily -  before meals and at bedtime. 07/27/24 08/26/24  Laurence Locus, DO  PARoxetine  (PAXIL ) 20 MG tablet Take 20 mg by mouth daily.    [provider]  urea  (URE-NA) 15 g PACK oral packet Take 15 g by mouth daily. 07/28/24 08/27/24  Laurence Locus, DO    Physical Exam: Vitals:   08/04/24 1930 08/04/24 2025 08/04/24 2030 08/04/24 2035  BP: (!) 144/71 129/60 130/60 (!) 134/59  Pulse: 69 76 75 75  Resp: 16 18 18 17   Temp:      SpO2: 100% 93% 93% 94%   Physical Exam:  General: No acute  distress, well developed, well nourished elderly woman HEENT: Normocephalic, atraumatic, PERRL Cardiovascular: Normal rhythm. Tachy, Distal pulses intact. Pulmonary: Normal pulmonary effort, normal breath sounds Gastrointestinal: Nondistended abdomen, soft, non-tender, normoactive bowel sounds Musculoskeletal:Normal ROM, no lower ext edema Lymphadenopathy: No cervical LAD. Skin: Skin is warm and dry. Neuro: No focal deficits noted, AAOx3. PSYCH: Attentive and cooperative  Data Reviewed:  Results for orders placed or performed during the hospital encounter of 08/04/24 (from the past 24 hours)  Resp panel by RT-PCR (RSV, Flu A&B, Covid) Anterior Nasal Swab     Status: None   Collection Time: 08/04/24  7:20 PM   Specimen: Anterior Nasal Swab  Result Value Ref Range   SARS Coronavirus 2 by RT PCR NEGATIVE NEGATIVE   Influenza A by PCR NEGATIVE NEGATIVE   Influenza B by PCR NEGATIVE NEGATIVE   Resp Syncytial Virus by PCR NEGATIVE NEGATIVE  Comprehensive metabolic panel     Status: Abnormal   Collection Time: 08/04/24  7:20 PM  Result Value Ref Range   Sodium 131 (L) 135 - 145 mmol/L   Potassium 4.2 3.5 - 5.1 mmol/L   Chloride 95 (L) 98 - 111 mmol/L   CO2 25 22 - 32 mmol/L   Glucose, Bld 180 (H) 70 - 99 mg/dL   BUN 47 (H) 8 - 23 mg/dL   Creatinine, Ser 8.61 (H) 0.44 - 1.00 mg/dL   Calcium  8.7 (L) 8.9 - 10.3 mg/dL   Total Protein 6.8 6.5 - 8.1 g/dL   Albumin 3.1 (L) 3.5 - 5.0 g/dL   AST 32 15 - 41 U/L   ALT 23 0 - 44 U/L   Alkaline Phosphatase 79 38 - 126 U/L   Total Bilirubin 0.9 0.0 - 1.2 mg/dL   GFR, Estimated 36 (L) >60 mL/min   Anion gap 11 5 - 15  CBC     Status: Abnormal   Collection Time: 08/04/24  7:20 PM  Result Value Ref Range   WBC 21.1 (H) 4.0 - 10.5 K/uL   RBC 3.68 (L) 3.87 - 5.11 MIL/uL   Hemoglobin 11.5 (L) 12.0 - 15.0 g/dL   HCT 64.5 (L) 63.9 - 53.9 %   MCV 96.2 80.0 - 100.0 fL   MCH 31.3 26.0 - 34.0 pg   MCHC 32.5 30.0 - 36.0 g/dL   RDW 87.2 88.4 - 84.4 %    Platelets 289 150 - 400 K/uL   nRBC 0.0 0.0 - 0.2 %  Brain natriuretic peptide     Status: Abnormal   Collection Time: 08/04/24  7:20 PM  Result Value Ref Range   B Natriuretic Peptide 363.5 (H) 0.0 - 100.0 pg/mL  Troponin I (High Sensitivity)     Status: Abnormal   Collection Time: 08/04/24  7:20 PM  Result Value Ref Range   Troponin I (High Sensitivity) 37 (H) <18 ng/L  I-stat chem 8, ED (not at [facility], [facility] or [facility])     Status: Abnormal   Collection Time: 08/04/24  7:29 PM  Result Value Ref Range   Sodium 133 (L) 135 - 145 mmol/L   Potassium 4.2 3.5 - 5.1 mmol/L   Chloride 96 (L) 98 - 111 mmol/L   BUN 46 (H) 8 - 23 mg/dL   Creatinine, Ser 8.69 (H) 0.44 - 1.00 mg/dL   Glucose, Bld 817 (H) 70 - 99 mg/dL   Calcium , Ion 1.18 1.15 - 1.40 mmol/L   TCO2 26 22 - 32 mmol/L   Hemoglobin 12.6 12.0 - 15.0 g/dL   HCT 62.9 63.9 - 53.9 %  I-Stat CG4 Lactic Acid     Status: Abnormal   Collection Time: 08/04/24  7:56 PM  Result Value Ref Range   Lactic Acid, Venous 2.0 (HH) 0.5 - 1.9 mmol/L   Comment NOTIFIED PHYSICIAN   I-Stat arterial blood gas, ED     Status: Abnormal   Collection Time: 08/04/24  8:10 PM  Result Value Ref Range   pH, Arterial 7.352 7.35 - 7.45   pCO2 arterial 48.2 (H) 32 - 48 mmHg   pO2, Arterial 74 (L) 83 - 108 mmHg   Bicarbonate 26.7 20.0 - 28.0 mmol/L   TCO2 28 22 - 32 mmol/L   O2 Saturation 94 %   Acid-Base Excess 1.0 0.0 - 2.0 mmol/L   Sodium 131 (L) 135 - 145 mmol/L   Potassium 4.1 3.5 - 5.1 mmol/L   Calcium , Ion 1.22 1.15 - 1.40 mmol/L   HCT 33.0 (L) 36.0 - 46.0 %   Hemoglobin 11.2 (L) 12.0 - 15.0 g/dL   Patient temperature 01.2 F    Collection site RADIAL, ALLEN'S TEST ACCEPTABLE    Drawn by RT    Sample type ARTERIAL   I-Stat arterial blood gas, ED     Status: Abnormal   Collection Time: 08/04/24  8:16 PM  Result Value Ref Range   pH, Arterial 7.372 7.35 - 7.45   pCO2 arterial 47.2 32 - 48 mmHg   pO2, Arterial 79 (L) 83 - 108 mmHg   Bicarbonate 27.4  20.0 - 28.0 mmol/L   TCO2 29 22 - 32 mmol/L   O2 Saturation 95 %   Acid-Base Excess 1.0 0.0 - 2.0 mmol/L   Sodium 131 (L) 135 - 145 mmol/L   Potassium 4.2 3.5 - 5.1 mmol/L   Calcium , Ion 1.22 1.15 - 1.40 mmol/L   HCT 39.0 36.0 - 46.0 %   Hemoglobin 13.3 12.0 - 15.0 g/dL   Patient temperature 01.2 F    Collection site RADIAL, ALLEN'S TEST ACCEPTABLE    Drawn by RT    Sample type ARTERIAL      Assessment and Plan: Acute respiratory distress. Cause unclear. The patient developed acute shortness of breath about 30 minutes before she called EMS. This is similar to the episode she had that had her hospitalized over a week ago. - She is no longer requiring CPAP - Consult cardiology in am - Continue to cycle troponins  2.  Atrial fibrillation - continue Eliquis  and Metoprolol .   3.  Possible pneumonia - chest x-ray resulted pneumonia versus CHF  The patient was started on on Rocephin  and Zithromax  empirically.  The sudden onset of symptoms is not consistent with pneumonia but she does have  an elevated white blood cell  count.  4. Htn - continue twice daily Lopressor   5. CKD - creatinine is close to baseline at 1.3     Advance Care Planning:   Code Status: Prior  The patient names her son who lives in [place] as her surrogate decision maker and she wants to be full code, but if it looks like she is not going to make it she does not want to suffer and be in distress like she was today.  Consults: none  Family Communication: none  Severity of Illness: The appropriate patient status for this patient is INPATIENT. Inpatient status is judged to be reasonable and necessary in order to provide the required intensity of service to ensure the patient's safety. The patient's presenting symptoms, physical exam findings, and initial radiographic and laboratory data in the context of their chronic comorbidities is felt to place them at high risk for further clinical deterioration. Furthermore, it  is not anticipated that the patient will be medically stable for discharge from the hospital within 2 midnights of admission.   * I certify that at the point of admission it is my clinical judgment that the patient will require inpatient hospital care spanning beyond 2 midnights from the point of admission due to high intensity of service, high risk for further deterioration and high frequency of surveillance required.*  Author: ARTHEA CHILD, MD 08/04/2024 9:56 PM  For on call review [email or web address].

## 2024-08-04 NOTE — ED Triage Notes (Signed)
 Pt arrives EMS with reports of sudden onset SHOB tonight just PTA. Pt was 50s on RA with EMS and transitioned to CPAP. Pt was HTN on arrival with EMS and was given 2 nitroglycerin .

## 2024-08-04 NOTE — Progress Notes (Signed)
 First Abg results at 2010 was a clerical error,  second sample  that was ran again at 2016  is under the correct sample code.[place] Knee. [place] Hasten CRT

## 2024-08-05 DIAGNOSIS — Z7189 Other specified counseling: Secondary | ICD-10-CM | POA: Diagnosis not present

## 2024-08-05 DIAGNOSIS — Z515 Encounter for palliative care: Secondary | ICD-10-CM

## 2024-08-05 DIAGNOSIS — R0603 Acute respiratory distress: Secondary | ICD-10-CM | POA: Diagnosis not present

## 2024-08-05 DIAGNOSIS — J810 Acute pulmonary edema: Secondary | ICD-10-CM | POA: Diagnosis not present

## 2024-08-05 DIAGNOSIS — N179 Acute kidney failure, unspecified: Secondary | ICD-10-CM

## 2024-08-05 LAB — BASIC METABOLIC PANEL WITH GFR
Anion gap: 11 (ref 5–15)
BUN: 44 mg/dL — ABNORMAL HIGH (ref 8–23)
CO2: 27 mmol/L (ref 22–32)
Calcium: 8.3 mg/dL — ABNORMAL LOW (ref 8.9–10.3)
Chloride: 95 mmol/L — ABNORMAL LOW (ref 98–111)
Creatinine, Ser: 1.53 mg/dL — ABNORMAL HIGH (ref 0.44–1.00)
GFR, Estimated: 32 mL/min — ABNORMAL LOW (ref >60)
Glucose, Bld: 165 mg/dL — ABNORMAL HIGH (ref 70–99)
Potassium: 4 mmol/L (ref 3.5–5.1)
Sodium: 133 mmol/L — ABNORMAL LOW (ref 135–145)

## 2024-08-05 LAB — CBC
HCT: 30 % — ABNORMAL LOW (ref 36.0–46.0)
Hemoglobin: 10 g/dL — ABNORMAL LOW (ref 12.0–15.0)
MCH: 31.1 pg (ref 26.0–34.0)
MCHC: 33.3 g/dL (ref 30.0–36.0)
MCV: 93.2 fL (ref 80.0–100.0)
Platelets: 232 K/uL (ref 150–400)
RBC: 3.22 MIL/uL — ABNORMAL LOW (ref 3.87–5.11)
RDW: 13.1 % (ref 11.5–15.5)
WBC: 20.3 K/uL — ABNORMAL HIGH (ref 4.0–10.5)
nRBC: 0 % (ref 0.0–0.2)

## 2024-08-05 LAB — PROCALCITONIN: Procalcitonin: 2.71 ng/mL

## 2024-08-05 LAB — MAGNESIUM: Magnesium: 1.6 mg/dL — ABNORMAL LOW (ref 1.7–2.4)

## 2024-08-05 MED ORDER — MAGNESIUM SULFATE 2 GM/50ML IV SOLN
2.0000 g | Freq: Once | INTRAVENOUS | Status: AC
  Administered 2024-08-05: 2 g via INTRAVENOUS
  Filled 2024-08-05: qty 50

## 2024-08-05 MED ORDER — METOPROLOL TARTRATE 12.5 MG HALF TABLET
12.5000 mg | ORAL_TABLET | Freq: Two times a day (BID) | ORAL | Status: DC
  Administered 2024-08-05 – 2024-08-08 (×6): 12.5 mg via ORAL
  Filled 2024-08-05 (×6): qty 1

## 2024-08-05 MED ORDER — SODIUM CHLORIDE 0.9 % IV SOLN
1.0000 g | INTRAVENOUS | Status: DC
  Administered 2024-08-05 – 2024-08-06 (×2): 1 g via INTRAVENOUS
  Filled 2024-08-05 (×3): qty 10

## 2024-08-05 MED ORDER — SODIUM CHLORIDE 0.9 % IV SOLN
500.0000 mg | INTRAVENOUS | Status: DC
  Administered 2024-08-05: 500 mg via INTRAVENOUS
  Filled 2024-08-05 (×2): qty 5

## 2024-08-05 MED ORDER — METOPROLOL TARTRATE 25 MG PO TABS: 25.0000 mg | ORAL_TABLET | Freq: Two times a day (BID) | ORAL | Status: DC

## 2024-08-05 NOTE — Plan of Care (Signed)
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
## 2024-08-05 NOTE — Consult Note (Signed)
 Palliative Care Consult Note                                  Date: 08/05/2024   [name]  DOB: 07/08/1930  MRN: [id number]  Age / Sex: 89 y.o., female  PCP: Loreli Elsie [name], MD Referring Physician: Cheryle Page, MD  Reason for Consultation: Establishing goals of care  Past Medical History:  Diagnosis Date   A-fib [facility])    Chest pain    Decreased diffusion capacity    Hyperlipidemia    Hypertension    SOB (shortness of breath)     Subjective:   This NP Kathlyne Bolder and NP Camellia Kays reviewed medical records, received report from team, assessed the patient and then meet at the patient's bedside to discuss diagnosis, prognosis, GOC, EOL wishes disposition and options.  Before meeting with the patient/family, We spent time reviewing the chart notes including hospitalist and cardiology notes from today. We also reviewed vital signs, nursing flowsheets, medication administrations record, labs, and imaging. Labs reviewed include CBC that shows a WBC count 20.3, and elevated procalcitonin at 2.71 likely due to ongoiong infectious process, troponin elevated at 220 from 37 related to demand ischemia in the setting of acute illness.   We met with the patient at her bedside today, present at bedside is her niece, Cheryl Peterson. The patient is alert and oriented to person, place, and time. She appears frail but is not in any acute distress and is able to communicate effectively. She demonstrates a reasonable understanding of her situation and was able to recount the events leading to her hospital admission. She shared that on the night she was admitted, she was at her baseline health status at her independent living facility [facility]) when she suddenly experienced shortness of breath. EMS was activated and she was brought to the hospital for further evaluation and care. She also noted a recent hospitalization for a similar  episode of shortness of breath.  We reviewed her medical history, and she appears to have decent knowledge about her chronic conditions, including hypertension, anxiety/depression, and atrial fibrillation. She mentioned that she often does not perceive the irregular heart rhythms when they occur.  Our discussion today focused on her diagnosis, prognosis, goals of care, end-of-life preferences, disposition, and available options. We reviewed her current medical condition and chronic illnesses. We explained potential contributors to her shortness of breath, including pneumonia, congestive heart failure (CHF), and atrial fibrillation. While pneumonia is treatable, we discussed that her chronic conditions may progressively limit her functional capacity, and that exertional shortness of breath may become her new baseline. The patient expressed a desire to improve her breathing, recover, and return to [facility], where she hopes to maintain her independence. Both the patient and her niece, Cheryl Peterson, reported that issues with her home durable medical equipment (DME), specifically her supplemental oxygen, contributed to inconsistent use following her most recent discharge. This may have been a factor in her current readmission. Patient is hopeful for a successful discharge this time around.   Regarding her code status, he patient was informed about the components of resuscitation, including chest compressions and intubation. She expressed a desire to receive chest compressions but declined intubation. It was explained that intubation is an integral part of the resuscitation process. The patient then requested that her son, Cheryl Peterson, be contacted to assist in making a final decision regarding her  code status, as her two sons are her designated Diplomatic Services operational officer.  Shortly thereafter, we spoke with Cheryl Peterson by phone. He was updated on the patient's current condition and the reason for  hospitalization. After discussing the code status, he expressed his preference for the patient to remain Full Code, believing this aligns with her wishes.  Concept of Palliative Care was introduced as specialized medical care for people and their families living with serious illness.  If focuses on providing relief from the symptoms and stress of a serious illness.  The goal is to improve quality of life for both the patient and the family. Values and goals of care important to patient and family were attempted to be elicited.  Created space and opportunity for patient  and family to explore thoughts and feelings regarding current medical situation   Natural trajectory and current clinical status were discussed. Questions and concerns addressed. Patient  encouraged to call with questions or concerns.    Patient/Family Understanding of Illness: She demonstrates a reasonable understanding of her situation and was able to recount the events leading to her hospital admission. She shared that on the night she was admitted, she was at her baseline health status at her independent living facility [facility]) when she suddenly experienced shortness of breath. EMS was activated and she was brought to the hospital for further evaluation and care. She also noted a recent hospitalization for a similar episode of shortness of breath. She is aware that we are currently treating her community acquired pneumonia with antibiotics.   Life Review: The patient resides at [facility] and currently receives assistance from a caregiver for her instrumental activities of daily living (IADLs). Despite this, she remains highly independent. 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 She has three sons, two of whom live out of state. A niece lives locally and makes an effort to be available to support her as needed.  Patient Values: She highly values her  independence. She values her health, believes that she will get better with the help of medical interventions.   Baseline Status: She is ambulatory at baseline with the use of a walker. Respiratory-wise she is on room air until this most recent hospital stay when she was required to be on 2LPM/[place].   Today's Discussion:  Met with the patient today to review her current medical condition and available treatment options. She expressed a strong desire to improve and hopes to be discharged back to her residence at [facility]. We also revisited her code status preferences. At this time, she wishes to remain Full Code and continue receiving treatment for reversible conditions in hopes of clinical improvement.   Goals: The patient's primary goal is to recover from current illness and be discharged home to [facility], with the hope of regaining some degree of independence.    Objective:   Primary Diagnoses: Present on Admission:  Acute respiratory distress  Essential hypertension  AKI (acute kidney injury) (HCC)  Atypical atrial flutter (HCC)  CKD stage 3a, GFR 45-59 ml/min (HCC)   Vital Signs:  BP 102/70 (BP Location: Right Arm)   Pulse 76   Temp 98 F (36.7 C) (Oral)   Resp 20   SpO2 95%   Physical Exam Vitals and nursing note reviewed.  Constitutional:      Appearance: Normal appearance.  HENT:     Head: Normocephalic and atraumatic.  Cardiovascular:     Rate and Rhythm:  Normal rate. Rhythm irregular.  Pulmonary:     Effort: Pulmonary effort is normal.     Comments: Currently on 2LPM/[place]  Musculoskeletal:     Comments: Generalized weakness   Skin:    General: Skin is warm and dry.  Neurological:     General: No focal deficit present.     Mental Status: She is alert and oriented to person, place, and time.     Palliative Assessment/Data: 40 to 50%   Advanced Care Planning:   Existing Vynca/ACP Documentation: None on file however patient's sons Cheryl Peterson and Cheryl Peterson are the [facility]. Copies requested.  Primary Decision Maker: PATIENT  Pertinent diagnosis: Acute on chronic hypoxic respiratory failure, community acquired pneumonia, acute on chronic HFpEF, PAF.  The patient and/or family consented to a voluntary Advance Care Planning Conversation in person/over the phone. Individuals present for the conversation: Patient and Cheryl Peterson [facility])  Summary of the conversation: The patient is to remain Full Code, with continued medical interventions as appropriate, in alignment with her current goals and preferences. A request has been made to upload a copy of her Advance Care Planning (ACP) documents into the electronic health record (EHR) for reference and documentation.  Outcome of the conversations and/or documents completed: Continue medical interventions as appropriate, in alignment with the patient's goals of care and desire for clinical improvement, with the aim of successful discharge to her home at [facility]. A request has been made to obtain and upload a copy of the patient's Advance Care Planning (ACP) documents into the electronic health record (EHR).  I spent 30 minutes providing separately identifiable ACP services with the patient and/or surrogate decision maker in a voluntary, in-person conversation discussing the patient's wishes and goals as detailed in the above note.  Assessment & Plan:   HPI/Patient Profile: 89 y.o. female  with past medical history of CKD stage IIIa, hypertension, hyperlipidemia, hypothyroidism, depression, paroxysmal A-fib on Eliquis   admitted on 08/04/2024 with worsening shortness of breath.   Worth to note that she was recently hospitalized from 07/21/2024 to 07/27/2024 for acute respiratory failure from A-fib with RVR and CHF exacerbation along with acute kidney injury.  SUMMARY OF RECOMMENDATIONS   Code Status: Full Code Continue to provide medical interventions as appropriate per  patient/family's desire.  Provided psychosocial and emotional support PMT will continue to monitor and provide support   Symptom Management:  Per attending team Continue with IV antibiotic for Pneumonia Continue with pulmonary hygiene Continue with O2 supplement at 2LPM/[place]   Code Status: Full Code  Prognosis:  Relatively poor, high risk for decompensation given advanced age and multiple comorbidities.   Discharge Planning:  To Be Determined   Discussed with: The patient, patient's son Cheryl Peterson) niece Cheryl Peterson) and the treatment team.    Thank you for allowing us  to participate in the care of SUZANNAH BETTES PMT will continue to support holistically.   Billing based on MDM: High  Problems Addressed: One acute or chronic illness or injury that poses a threat to life or bodily function  Amount and/or Complexity of Data: Category 1:Review of prior external note(s) from each unique source, Review of the result(s) of each unique test, and Assessment requiring an independent historian(s)  Risks:   Detailed review of medical records (labs, imaging, vital signs), medically appropriate exam, discussed with treatment team, counseling and education to patient, family, & staff, documenting clinical information, medication management, coordination of care.  Signed by: Kathlyne Bolder, NP Palliative Medicine Team  Team Phone # [phone] (Nights/Weekends)  08/05/2024, 3:07 PM

## 2024-08-05 NOTE — Progress Notes (Signed)
 [facility] night cross cover note:   I was contacted by Dr. Clair, [facility], who conveyed that CXR is concerning of pna, while presenting labs notable for leukocytosis. For now, will continue azithromycin  and Rocephin , and I have added on a procalcitonin level to further assess the above.     Eva Pore, DO Hospitalist

## 2024-08-05 NOTE — Progress Notes (Signed)
 Heart Failure Navigator Progress Note  Assessed for Heart & Vascular TOC clinic readiness.  Patient does not meet criteria due to EF 70-75%, has a Palliative Care consult in for Goals of Care, and a scheduled [facility] appointment on 09/27/2024. No HF TOC. .   Navigator will sign off at this time.   Stephane Haddock, BSN, Scientist, clinical (histocompatibility and immunogenetics) Only

## 2024-08-05 NOTE — Progress Notes (Signed)
 Mobility Specialist Progress Note:   08/05/24 1200  Mobility  Activity Ambulated with assistance  Level of Assistance Contact guard assist, steadying assist  Assistive Device Front wheel walker  Distance Ambulated (ft) 150 ft  Activity Response Tolerated well  Mobility Referral Yes  Mobility visit 1 Mobility  Mobility Specialist Start Time (ACUTE ONLY) 1200  Mobility Specialist Stop Time (ACUTE ONLY) 1225  Mobility Specialist Time Calculation (min) (ACUTE ONLY) 25 min   Pt agreeable to mobility session. Required only CGA to stand and ambulate with RW for safety. HR elevated to 140 with exertion, unsustained. Pt c/o generalized weakness, no LOB noted. Back in bed with all needs met.   Therisa Rana Mobility Specialist Please contact via SecureChat or  Rehab office at [phone]

## 2024-08-05 NOTE — Progress Notes (Signed)
 PROGRESS NOTE    Cheryl Peterson  FMW:[id number] DOB: [birth year] DOA: 08/04/2024 PCP: Loreli Elsie [name], MD   Brief Narrative:  89 year old female with history of CKD stage IIIa, hypertension, hyperlipidemia, hypothyroidism, depression, paroxysmal A-fib on Eliquis  with recent hospitalization from 07/21/2024-07/27/2024 for acute respiratory failure from A-fib with RVR and CHF exacerbation along with acute kidney injury with subsequent discharge off diuretics.  She presented with worsening shortness of breath.  On presentation, she required BiPAP which was subsequently taken off.  WBC of 21.1, creatinine of 1.38, BNP of 363.5, COVID/influenza/RSV PCR negative, high-sensitivity troponins of 37 with chest x-ray showing bilateral airspace disease.  She was started on IV antibiotics.  Assessment & Plan:   Acute on chronic hypoxic respiratory failure Possible community-acquired pneumonia -Was recently discharged home on 2 L oxygen via nasal cannula at your last hospitalization.  Required BiPAP on presentation.  Respite status improving.  Currently on 2 L oxygen via nasal cannula. - Imaging as above.  Procalcitonin 2.71 on presentation.  Follow blood cultures.  Continue Rocephin  and Zithromax .  Positive troponins - High sensitive troponin 37 on presentation.  Has bumped up to 220.  Consult cardiology.  Paroxysmal A-fib - Has mild intermittent tachycardia.  Continue metoprolol  and apixaban .  Chronic diastolic CHF - Currently compensated.  Strict input and output.  Daily weights.  Fluid restriction.  Will follow cardiology recommendations regarding need for diuretic use.  Diuretics were recently held during last hospitalization  Hyponatremia - Mild.  Monitor  Hypomagnesemia - Replace.  Repeat a.m. labs  Leukocytosis -Still significant.  Monitor  Hypertension Hyperlipidemia - Continue metoprolol  tartrate and statin  Depression-continue paroxetine   Hypothyroidism -Continue  levothyroxine   Goals of care - Consult palliative care for goals of care discussion.  Currently listed as full code  DVT prophylaxis: Eliquis  Code Status: Full Family Communication: None at bedside Disposition Plan: Status is: Inpatient Remains inpatient appropriate because: Of severity of illness    Consultants: Consult cardiology and palliative care  Procedures: None  Antimicrobials: Rocephin  and Zithromax  from 08/04/2024 onwards   Subjective: Patient seen and examined at bedside.  Feels slightly better.  Still short of breath with exertion.  Denies any chest pain.  No fever or vomiting reported.  Objective: Vitals:   08/04/24 2336 08/04/24 2354 08/05/24 0432 08/05/24 0753  BP: (!) 121/58 (!) 121/58 107/61 111/67  Pulse: 73 (!) 122 69 80  Resp: 18  19 20   Temp: 98.2 F (36.8 C)  98.4 F (36.9 C) 98.1 F (36.7 C)  TempSrc: Oral  Oral Oral  SpO2: 93%  95% 96%    Intake/Output Summary (Last 24 hours) at 08/05/2024 0804 Last data filed at 08/04/2024 2142 Gross per 24 hour  Intake 239.26 ml  Output --  Net 239.26 ml   There were no vitals filed for this visit.  Examination:  General exam: Appears calm and comfortable.  Elderly female lying in bed. Respiratory system: Bilateral decreased breath sounds at bases with scattered crackles Cardiovascular system: S1 & S2 heard, intermittently tachycardic gastrointestinal system: Abdomen is nondistended, soft and nontender. Normal bowel sounds heard. Extremities: No cyanosis, clubbing, edema  Central nervous system: Alert, slow to respond.  Poor historian.  No focal neurological deficits. Moving extremities Skin: No rashes, lesions or ulcers Psychiatry: Flat affect.  Not agitated.   Data Reviewed: I have personally reviewed following labs and imaging studies  CBC: Recent Labs  Lab 08/02/24 1234 08/04/24 1920 08/04/24 1929 08/04/24 2010 08/04/24 2016 08/05/24 0513  WBC  7.9 21.1*  --   --   --  20.3*  HGB 11.3 11.5*  12.6 11.2* 13.3 10.0*  HCT 34.7 35.4* 37.0 33.0* 39.0 30.0*  MCV 97 96.2  --   --   --  93.2  PLT 254 289  --   --   --  232   Basic Metabolic Panel: Recent Labs  Lab 08/02/24 1234 08/04/24 1920 08/04/24 1929 08/04/24 2010 08/04/24 2016 08/05/24 0513  NA 132* 131* 133* 131* 131* 133*  K 5.3* 4.2 4.2 4.1 4.2 4.0  CL 92* 95* 96*  --   --  95*  CO2 25 25  --   --   --  27  GLUCOSE 107* 180* 182*  --   --  165*  BUN 35 47* 46*  --   --  44*  CREATININE 1.37* 1.38* 1.30*  --   --  1.53*  CALCIUM  9.4 8.7*  --   --   --  8.3*  MG  --   --   --   --   --  1.6*   GFR: Estimated Creatinine Clearance: 22.3 mL/min (A) (by C-G formula based on SCr of 1.53 mg/dL (H)). Liver Function Tests: Recent Labs  Lab 08/04/24 1920  AST 32  ALT 23  ALKPHOS 79  BILITOT 0.9  PROT 6.8  ALBUMIN 3.1*   No results for input(s): LIPASE, AMYLASE in the last 168 hours. No results for input(s): AMMONIA in the last 168 hours. Coagulation Profile: No results for input(s): INR, PROTIME in the last 168 hours. Cardiac Enzymes: No results for input(s): CKTOTAL, CKMB, CKMBINDEX, TROPONINI in the last 168 hours. BNP (last 3 results) No results for input(s): PROBNP in the last 8760 hours. HbA1C: No results for input(s): HGBA1C in the last 72 hours. CBG: No results for input(s): GLUCAP in the last 168 hours. Lipid Profile: No results for input(s): CHOL, HDL, LDLCALC, TRIG, CHOLHDL, LDLDIRECT in the last 72 hours. Thyroid  Function Tests: No results for input(s): TSH, T4TOTAL, FREET4, T3FREE, THYROIDAB in the last 72 hours. Anemia Panel: No results for input(s): VITAMINB12, FOLATE, FERRITIN, TIBC, IRON, RETICCTPCT in the last 72 hours. Sepsis Labs: Recent Labs  Lab 08/04/24 1956  LATICACIDVEN 2.0*    Recent Results (from the past 240 hours)  Resp panel by RT-PCR (RSV, Flu A&B, Covid) Anterior Nasal Swab     Status: None   Collection Time:  08/04/24  7:20 PM   Specimen: Anterior Nasal Swab  Result Value Ref Range Status   SARS Coronavirus 2 by RT PCR NEGATIVE NEGATIVE Final   Influenza A by PCR NEGATIVE NEGATIVE Final   Influenza B by PCR NEGATIVE NEGATIVE Final    Comment: (NOTE) The Xpert Xpress SARS-CoV-2/FLU/RSV plus assay is intended as an aid in the diagnosis of influenza from Nasopharyngeal swab specimens and should not be used as a sole basis for treatment. Nasal washings and aspirates are unacceptable for Xpert Xpress SARS-CoV-2/FLU/RSV testing.  Fact Sheet for Patients: BloggerCourse.com  Fact Sheet for Healthcare Providers: SeriousBroker.it  This test is not yet approved or cleared by the [place]  FDA and has been authorized for detection and/or diagnosis of SARS-CoV-2 by FDA under an Emergency Use Authorization (EUA). This EUA will remain in effect (meaning this test can be used) for the duration of the COVID-19 declaration under Section 564(b)(1) of the Act, 21 U.S.C. section 360bbb-3(b)(1), unless the authorization is terminated or revoked.     Resp Syncytial Virus by PCR NEGATIVE NEGATIVE  Final    Comment: (NOTE) Fact Sheet for Patients: BloggerCourse.com  Fact Sheet for Healthcare Providers: SeriousBroker.it  This test is not yet approved or cleared by the [place]  FDA and has been authorized for detection and/or diagnosis of SARS-CoV-2 by FDA under an Emergency Use Authorization (EUA). This EUA will remain in effect (meaning this test can be used) for the duration of the COVID-19 declaration under Section 564(b)(1) of the Act, 21 U.S.C. section 360bbb-3(b)(1), unless the authorization is terminated or revoked.  Performed at [facility], [address], [place], [place] [address]          Radiology Studies: DG Chest Port 1 View Result Date: 08/04/2024 CLINICAL DATA:   Shortness of breath EXAM: PORTABLE CHEST 1 VIEW COMPARISON:  07/21/2024 FINDINGS: Heart and mediastinal contours within normal limits. Bilateral airspace disease, diffuse throughout the left lung and most confluent on the right in the right lower lobe. Small left pleural effusion. No acute bony abnormality. IMPRESSION: Bilateral airspace disease, asymmetric to the left. This could reflect asymmetric edema or infection. Electronically Signed   By: Franky Crease M.D.   On: 08/04/2024 19:30        Scheduled Meds:  apixaban   2.5 mg Oral BID   atorvastatin   10 mg Oral Daily   levothyroxine   50 mcg Oral QAC breakfast   metoprolol  tartrate  12.5 mg Oral BID   PARoxetine   20 mg Oral Daily   Continuous Infusions:  azithromycin      cefTRIAXone  (ROCEPHIN )  IV     nitroGLYCERIN  Stopped (08/04/24 1938)          Sophie Mao, MD [facility] 08/05/2024, 8:04 AM

## 2024-08-06 DIAGNOSIS — J189 Pneumonia, unspecified organism: Secondary | ICD-10-CM | POA: Diagnosis not present

## 2024-08-06 DIAGNOSIS — N179 Acute kidney failure, unspecified: Secondary | ICD-10-CM | POA: Diagnosis not present

## 2024-08-06 DIAGNOSIS — R0902 Hypoxemia: Secondary | ICD-10-CM | POA: Diagnosis not present

## 2024-08-06 DIAGNOSIS — R0603 Acute respiratory distress: Secondary | ICD-10-CM | POA: Diagnosis not present

## 2024-08-06 LAB — BASIC METABOLIC PANEL WITH GFR
Anion gap: 7 (ref 5–15)
BUN: 27 mg/dL — ABNORMAL HIGH (ref 8–23)
CO2: 27 mmol/L (ref 22–32)
Calcium: 8.3 mg/dL — ABNORMAL LOW (ref 8.9–10.3)
Chloride: 100 mmol/L (ref 98–111)
Creatinine, Ser: 1.14 mg/dL — ABNORMAL HIGH (ref 0.44–1.00)
GFR, Estimated: 45 mL/min — ABNORMAL LOW (ref >60)
Glucose, Bld: 135 mg/dL — ABNORMAL HIGH (ref 70–99)
Potassium: 3.8 mmol/L (ref 3.5–5.1)
Sodium: 134 mmol/L — ABNORMAL LOW (ref 135–145)

## 2024-08-06 LAB — CBC WITH DIFFERENTIAL/PLATELET
Abs Immature Granulocytes: 0.03 K/uL (ref 0.00–0.07)
Basophils Absolute: 0 K/uL (ref 0.0–0.1)
Basophils Relative: 0 %
Eosinophils Absolute: 0.4 K/uL (ref 0.0–0.5)
Eosinophils Relative: 4 %
HCT: 31.6 % — ABNORMAL LOW (ref 36.0–46.0)
Hemoglobin: 10.5 g/dL — ABNORMAL LOW (ref 12.0–15.0)
Immature Granulocytes: 0 %
Lymphocytes Relative: 19 %
Lymphs Abs: 1.8 K/uL (ref 0.7–4.0)
MCH: 31.3 pg (ref 26.0–34.0)
MCHC: 33.2 g/dL (ref 30.0–36.0)
MCV: 94.3 fL (ref 80.0–100.0)
Monocytes Absolute: 0.9 K/uL (ref 0.1–1.0)
Monocytes Relative: 9 %
Neutro Abs: 6.4 K/uL (ref 1.7–7.7)
Neutrophils Relative %: 68 %
Platelets: 238 K/uL (ref 150–400)
RBC: 3.35 MIL/uL — ABNORMAL LOW (ref 3.87–5.11)
RDW: 13.3 % (ref 11.5–15.5)
WBC: 9.5 K/uL (ref 4.0–10.5)
nRBC: 0 % (ref 0.0–0.2)

## 2024-08-06 LAB — MAGNESIUM: Magnesium: 1.9 mg/dL (ref 1.7–2.4)

## 2024-08-06 MED ORDER — AZITHROMYCIN 500 MG PO TABS
500.0000 mg | ORAL_TABLET | Freq: Every day | ORAL | Status: DC
  Administered 2024-08-06 – 2024-08-08 (×3): 500 mg via ORAL
  Filled 2024-08-06 (×3): qty 1

## 2024-08-06 NOTE — Plan of Care (Signed)
  Problem: Activity: Goal: Risk for activity intolerance will decrease Outcome: Progressing   Problem: Pain Managment: Goal: General experience of comfort will improve and/or be controlled Outcome: Progressing   Problem: Safety: Goal: Ability to remain free from injury will improve Outcome: Progressing   Problem: Skin Integrity: Goal: Risk for impaired skin integrity will decrease Outcome: Progressing

## 2024-08-06 NOTE — Evaluation (Signed)
 Physical Therapy Evaluation Patient Details Name: Cheryl Peterson MRN: [id number] DOB: [birth year] Today's Date: 08/06/2024  History of Present Illness  89 year old female presented 9/6 with worsening shortness of breath. PMH: CKD stage IIIb, hypertension, hyperlipidemia, hypothyroidism, depression, paroxysmal A-fib on Eliquis  with recent hospitalization from 07/21/2024-07/27/2024 for acute respiratory failure from A-fib with RVR and CHF exacerbation along with acute kidney injury with subsequent discharge off diuretics.   Clinical Impression  Pt admitted with above diagnosis. Denies falls since using a more stable device at home (rollator.) She is transferring and ambulating at a supervision level today. Mild dyspnea with exertion, SpO2 88% on 2L supplemental O2, in mid-upper 90s at rest on 2L today. Pt states she is unsure of what oxygen amount she was supposed to use at home and was on 9L continuously. Would benefit from HHPT follow-up due to hx of falls. Pt currently with functional limitations due to the deficits listed below (see PT Problem List). Pt will benefit from acute skilled PT to increase their independence and safety with mobility to allow discharge.           If plan is discharge home, recommend the following: A little help with bathing/dressing/bathroom;Assistance with cooking/housework;Assist for transportation;A little help with walking and/or transfers   Can travel by private vehicle   Yes    Equipment Recommendations None recommended by PT  Recommendations for Other Services       Functional Status Assessment Patient has had a recent decline in their functional status and demonstrates the ability to make significant improvements in function in a reasonable and predictable amount of time.     Precautions / Restrictions Precautions Precautions: Fall Recall of Precautions/Restrictions: Intact Precaution/Restrictions Comments: monitor O2, Restrictions Weight Bearing  Restrictions Per Provider Order: No      Mobility  Bed Mobility               General bed mobility comments: sitting EOB    Transfers Overall transfer level: Needs assistance Equipment used: Rolling walker (2 wheels) Transfers: Sit to/from Stand Sit to Stand: Supervision           General transfer comment: Supervision for safety from bed and toilet. Slow to rise standing up from recliner (lower surface). Cues for hand placement. RW to steady upon rising.    Ambulation/Gait Ambulation/Gait assistance: Supervision Gait Distance (Feet): 160 Feet (+15 x2) Assistive device: Rolling walker (2 wheels) Gait Pattern/deviations: Decreased stride length, Narrow base of support, Drifts right/left, Step-through pattern Gait velocity: dec Gait velocity interpretation: <1.8 ft/sec, indicate of risk for recurrent falls   General Gait Details: Minor instability but self correctsw ith use of RW for support. Managed O2 for pt today. Walked to and from bathroom on 2L supplemental O2. Followed by 160' foot bout on 2L with SpO2 88% and mild dyspnea. No overt LOB but does show some sway when she stops. Educated on safety and AD use with RW.  Stairs            Wheelchair Mobility     Tilt Bed    Modified Scherer (Stroke Patients Only)       Balance Overall balance assessment: Needs assistance Sitting-balance support: No upper extremity supported, Feet supported Sitting balance-Leahy Scale: Fair     Standing balance support: Bilateral upper extremity supported, During functional activity, Reliant on assistive device for balance Standing balance-Leahy Scale: Poor Standing balance comment: RW to steady herself  Pertinent Vitals/Pain Pain Assessment Pain Assessment: No/denies pain    Home Living Family/patient expects to be discharged to:: Private residence Living Arrangements: Alone Available Help at Discharge: Other (Comment)  (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 pt reports not eating breakfast, gets up to exercise at 9:30 and then eats lunch from the facilty, pt fixes premade sauage sandwich or ensure drink for dinner. pt reports getting up frequently at night to void bladder    Prior Function Prior Level of Function : Independent/Modified Independent;History of Falls (last six months)             Mobility Comments: Denies falls since using rollator ADLs Comments: Ind, goes to dining hall once per day, otherwise makes her own meals. Attends exercise classes regularly at [facility]F.     Extremity/Trunk Assessment   Upper Extremity Assessment Upper Extremity Assessment: Defer to OT evaluation    Lower Extremity Assessment Lower Extremity Assessment: Generalized weakness    Cervical / Trunk Assessment Cervical / Trunk Assessment: Kyphotic  Communication   Communication Communication: No apparent difficulties    Cognition Arousal: Alert Behavior During Therapy: WFL for tasks assessed/performed   PT - Cognitive impairments: Safety/Judgement                         Following commands: Intact       Cueing Cueing Techniques: Verbal cues     General Comments General comments (skin integrity, edema, etc.): SpO2 88% with exertion on 2L supplemental O2. Improves to 90% and greater at rest within 1 minute.    Exercises     Assessment/Plan    PT Assessment Patient needs continued PT services  PT Problem List Decreased strength;Decreased activity tolerance;Decreased balance;Decreased mobility;Decreased range of motion;Decreased knowledge of use of DME;Decreased safety  awareness;Cardiopulmonary status limiting activity;Decreased cognition       PT Treatment Interventions DME instruction;Gait training;Functional mobility training;Therapeutic activities;Balance training;Neuromuscular re-education;Patient/family education;Therapeutic exercise;Cognitive remediation    PT Goals (Current goals can be found in the Care Plan section)  Acute Rehab PT Goals Patient Stated Goal: Go home PT Goal Formulation: With patient Time For Goal Achievement: 08/20/24 Potential to Achieve Goals: Good    Frequency Min 2X/week     Co-evaluation               AM-PAC PT 6 Clicks Mobility  Outcome Measure Help needed turning from your back to your side while in a flat bed without using bedrails?: None Help needed moving from lying on your back to sitting on the side of a flat bed without using bedrails?: A Little Help needed moving to and from a bed to a chair (including a wheelchair)?: A Little Help needed standing up from a chair using your arms (e.g., wheelchair or bedside chair)?: A Little Help needed to walk in hospital room?: A Little Help needed climbing 3-5 steps with a railing? : A Little 6 Click Score: 19    End of Session Equipment Utilized During Treatment: Gait belt;Oxygen Activity Tolerance: Patient tolerated treatment well Patient left: with call bell/phone within reach;in chair;with chair alarm set;with family/visitor present (\)   PT Visit Diagnosis: Unsteadiness on feet (R26.81);Other abnormalities  of gait and mobility (R26.89);Repeated falls (R29.6);Muscle weakness (generalized) (M62.81);History of falling (Z91.81);Difficulty in walking, not elsewhere classified (R26.2)    Time: 8781-8747 PT Time Calculation (min) (ACUTE ONLY): 34 min   Charges:   PT Evaluation $PT Eval Low Complexity: 1 Low PT Treatments $Gait Training: 8-22 mins PT General Charges $$ ACUTE PT VISIT: 1 Visit         Leontine Roads, PT, DPT [facility]   Rehabilitation Services Physical Therapist Office: [phone] Website: [email or web address]   Leontine GORMAN Roads 08/06/2024, 1:33 PM

## 2024-08-06 NOTE — Progress Notes (Signed)
 Mobility Specialist Progress Note:   08/06/24 1000  Mobility  Activity Ambulated with assistance  Level of Assistance Contact guard assist, steadying assist  Assistive Device Front wheel walker  Distance Ambulated (ft) 300 ft  Activity Response Tolerated well  Mobility Referral Yes  Mobility visit 1 Mobility  Mobility Specialist Start Time (ACUTE ONLY) 1000  Mobility Specialist Stop Time (ACUTE ONLY) 1020  Mobility Specialist Time Calculation (min) (ACUTE ONLY) 20 min   Pt agreeable to mobility session. Required only contact assist for safety throughout., No overt unsteadiness noted. Pt SOB at end of session, SpO2 dropped to 87% on 2LO2. Recovered with seated rest and PLB. Left sitting EOB with all needs met, family present. No recliner in room.  Therisa Rana Mobility Specialist Please contact via SecureChat or  Rehab office at [phone]

## 2024-08-06 NOTE — Progress Notes (Signed)
 PROGRESS NOTE    Cheryl Peterson  FMW:[id number] DOB: 1930/07/09 DOA: 08/04/2024 PCP: Loreli Elsie [name], MD   Brief Narrative:  89 year old female with history of CKD stage IIIb, hypertension, hyperlipidemia, hypothyroidism, depression, paroxysmal A-fib on Eliquis  with recent hospitalization from 07/21/2024-07/27/2024 for acute respiratory failure from A-fib with RVR and CHF exacerbation along with acute kidney injury with subsequent discharge off diuretics.  She presented with worsening shortness of breath.  On presentation, she required BiPAP which was subsequently taken off.  WBC of 21.1, creatinine of 1.38, BNP of 363.5, COVID/influenza/RSV PCR negative, high-sensitivity troponins of 37 with chest x-ray showing bilateral airspace disease.  She was started on IV antibiotics.  Cardiology and palliative care were consulted.  Assessment & Plan:   Acute on chronic hypoxic respiratory failure Possible community-acquired pneumonia -Was recently discharged home on 2 L oxygen via nasal cannula at your last hospitalization.  Required BiPAP on presentation.  Respiratory status improving.  Currently on 2 L oxygen via nasal cannula. - Imaging as above.  Procalcitonin 2.71 on presentation.  Follow blood cultures: Negative so far.  Continue Rocephin  and Zithromax .  Positive troponins: Possibly from demand ischemia in the setting of acute illness - High sensitive troponin 37 on presentation.  Bumped up to 220.  EKG without any ischemic changes - Cardiology following and recommending conservative management  Paroxysmal A-fib - Has mild intermittent bradycardia.  Continue metoprolol  and apixaban .  Chronic diastolic CHF - Currently compensated.  Strict input and output.  Daily weights.  Fluid restriction.  Will follow cardiology recommendations regarding need for diuretic use.  Diuretics were recently held during last hospitalization  CKD stage IIIb - Currently stable.  Monitor.  Hyponatremia - Mild.   Monitor  Hypomagnesemia - Improved  Leukocytosis - Resolved  Hypertension Hyperlipidemia - Continue metoprolol  tartrate and statin  Depression-continue paroxetine   Hypothyroidism -Continue levothyroxine   Goals of care - Palliative care following.  Currently listed as full code  DVT prophylaxis: Eliquis  Code Status: Full Family Communication: None at bedside Disposition Plan: Status is: Inpatient Remains inpatient appropriate because: Of severity of illness    Consultants: cardiology and palliative care  Procedures: None  Antimicrobials: Rocephin  and Zithromax  from 08/04/2024 onwards   Subjective: Patient seen and examined at bedside.  Continues to feel slightly better.  Still short of breath with mild exertion with intermittent cough.  No fever, vomiting reported. Feels weak Objective: Vitals:   08/06/24 0442 08/06/24 0500 08/06/24 0742 08/06/24 0747  BP: 132/62  (!) 158/69 (!) 141/69  Pulse: 61  71 62  Resp: 17  15 16   Temp: 98 F (36.7 C)  97.9 F (36.6 C) 97.6 F (36.4 C)  TempSrc: Oral  Oral Oral  SpO2: 96%  92% 95%  Weight:  69.6 kg      Intake/Output Summary (Last 24 hours) at 08/06/2024 0823 Last data filed at 08/06/2024 0330 Gross per 24 hour  Intake 1563.3 ml  Output 800 ml  Net 763.3 ml   Filed Weights   08/06/24 0500  Weight: 69.6 kg    Examination:  General: On 2 L oxygen by nasal cannula.  No distress.  Elderly female lying in bed. ENT/neck: No thyromegaly.  JVD is not elevated  respiratory: Decreased breath sounds at bases bilaterally with some crackles; no wheezing  CVS: S1-S2 heard, rate controlled currently Abdominal: Soft, nontender, slightly distended; no organomegaly, bowel sounds are heard Extremities: Trace lower extremity edema; no cyanosis  CNS: Awake and alert.  Slow to respond.  Poor historian.  No focal neurologic deficit.  Moves extremities Lymph: No obvious lymphadenopathy Skin: No obvious ecchymosis/lesions  psych:  Mostly flat affect with no signs of agitation.   Musculoskeletal: No obvious joint swelling/deformity    Data Reviewed: I have personally reviewed following labs and imaging studies  CBC: Recent Labs  Lab 08/02/24 1234 08/04/24 1920 08/04/24 1920 08/04/24 1929 08/04/24 2010 08/04/24 2016 08/05/24 0513 08/06/24 0401  WBC 7.9 21.1*  --   --   --   --  20.3* 9.5  NEUTROABS  --   --   --   --   --   --   --  6.4  HGB 11.3 11.5*   < > 12.6 11.2* 13.3 10.0* 10.5*  HCT 34.7 35.4*   < > 37.0 33.0* 39.0 30.0* 31.6*  MCV 97 96.2  --   --   --   --  93.2 94.3  PLT 254 289  --   --   --   --  232 238   < > = values in this interval not displayed.   Basic Metabolic Panel: Recent Labs  Lab 08/02/24 1234 08/02/24 1234 08/04/24 1920 08/04/24 1929 08/04/24 2010 08/04/24 2016 08/05/24 0513 08/06/24 0401  NA 132*   < > 131* 133* 131* 131* 133* 134*  K 5.3*  --  4.2 4.2 4.1 4.2 4.0 3.8  CL 92*  --  95* 96*  --   --  95* 100  CO2 25  --  25  --   --   --  27 27  GLUCOSE 107*  --  180* 182*  --   --  165* 135*  BUN 35  --  47* 46*  --   --  44* 27*  CREATININE 1.37*  --  1.38* 1.30*  --   --  1.53* 1.14*  CALCIUM  9.4  --  8.7*  --   --   --  8.3* 8.3*  MG  --   --   --   --   --   --  1.6* 1.9   < > = values in this interval not displayed.   GFR: Estimated Creatinine Clearance: 30.1 mL/min (A) (by C-G formula based on SCr of 1.14 mg/dL (H)). Liver Function Tests: Recent Labs  Lab 08/04/24 1920  AST 32  ALT 23  ALKPHOS 79  BILITOT 0.9  PROT 6.8  ALBUMIN 3.1*   No results for input(s): LIPASE, AMYLASE in the last 168 hours. No results for input(s): AMMONIA in the last 168 hours. Coagulation Profile: No results for input(s): INR, PROTIME in the last 168 hours. Cardiac Enzymes: No results for input(s): CKTOTAL, CKMB, CKMBINDEX, TROPONINI in the last 168 hours. BNP (last 3 results) No results for input(s): PROBNP in the last 8760 hours. HbA1C: No results  for input(s): HGBA1C in the last 72 hours. CBG: No results for input(s): GLUCAP in the last 168 hours. Lipid Profile: No results for input(s): CHOL, HDL, LDLCALC, TRIG, CHOLHDL, LDLDIRECT in the last 72 hours. Thyroid  Function Tests: No results for input(s): TSH, T4TOTAL, FREET4, T3FREE, THYROIDAB in the last 72 hours. Anemia Panel: No results for input(s): VITAMINB12, FOLATE, FERRITIN, TIBC, IRON, RETICCTPCT in the last 72 hours. Sepsis Labs: Recent Labs  Lab 08/04/24 1956 08/05/24 0508  PROCALCITON  --  2.71  LATICACIDVEN 2.0*  --     Recent Results (from the past 240 hours)  Resp panel by RT-PCR (RSV, Flu A&B, Covid) Anterior Nasal Swab  Status: None   Collection Time: 08/04/24  7:20 PM   Specimen: Anterior Nasal Swab  Result Value Ref Range Status   SARS Coronavirus 2 by RT PCR NEGATIVE NEGATIVE Final   Influenza A by PCR NEGATIVE NEGATIVE Final   Influenza B by PCR NEGATIVE NEGATIVE Final    Comment: (NOTE) The Xpert Xpress SARS-CoV-2/FLU/RSV plus assay is intended as an aid in the diagnosis of influenza from Nasopharyngeal swab specimens and should not be used as a sole basis for treatment. Nasal washings and aspirates are unacceptable for Xpert Xpress SARS-CoV-2/FLU/RSV testing.  Fact Sheet for Patients: BloggerCourse.com  Fact Sheet for Healthcare Providers: SeriousBroker.it  This test is not yet approved or cleared by the [place]  FDA and has been authorized for detection and/or diagnosis of SARS-CoV-2 by FDA under an Emergency Use Authorization (EUA). This EUA will remain in effect (meaning this test can be used) for the duration of the COVID-19 declaration under Section 564(b)(1) of the Act, 21 U.S.C. section 360bbb-3(b)(1), unless the authorization is terminated or revoked.     Resp Syncytial Virus by PCR NEGATIVE NEGATIVE Final    Comment: (NOTE) Fact Sheet for  Patients: BloggerCourse.com  Fact Sheet for Healthcare Providers: SeriousBroker.it  This test is not yet approved or cleared by the [place]  FDA and has been authorized for detection and/or diagnosis of SARS-CoV-2 by FDA under an Emergency Use Authorization (EUA). This EUA will remain in effect (meaning this test can be used) for the duration of the COVID-19 declaration under Section 564(b)(1) of the Act, 21 U.S.C. section 360bbb-3(b)(1), unless the authorization is terminated or revoked.  Performed at [facility], [address], [place], [place] [address]   Blood culture (routine x 2)     Status: None (Preliminary result)   Collection Time: 08/04/24  8:21 PM   Specimen: BLOOD RIGHT FOREARM  Result Value Ref Range Status   Specimen Description BLOOD RIGHT FOREARM  Final   Special Requests   Final    BOTTLES DRAWN AEROBIC AND ANAEROBIC Blood Culture results may not be optimal due to an inadequate volume of blood received in culture bottles   Culture   Final    NO GROWTH < 12 HOURS Performed at [facility], [address], [place], [place] [address]    Report Status PENDING  Incomplete  Blood culture (routine x 2)     Status: None (Preliminary result)   Collection Time: 08/04/24  8:21 PM   Specimen: BLOOD LEFT ARM  Result Value Ref Range Status   Specimen Description BLOOD LEFT ARM  Final   Special Requests   Final    BOTTLES DRAWN AEROBIC AND ANAEROBIC Blood Culture adequate volume   Culture   Final    NO GROWTH < 12 HOURS Performed at [facility], [address], [place], [place] [address]    Report Status PENDING  Incomplete         Radiology Studies: DG Chest Port 1 View Result Date: 08/04/2024 CLINICAL DATA:  Shortness of breath EXAM: PORTABLE CHEST 1 VIEW COMPARISON:  07/21/2024 FINDINGS: Heart and mediastinal contours within normal limits. Bilateral airspace disease, diffuse throughout the  left lung and most confluent on the right in the right lower lobe. Small left pleural effusion. No acute bony abnormality. IMPRESSION: Bilateral airspace disease, asymmetric to the left. This could reflect asymmetric edema or infection. Electronically Signed   By: Franky Crease M.D.   On: 08/04/2024 19:30  Scheduled Meds:  apixaban   2.5 mg Oral BID   atorvastatin   10 mg Oral Daily   levothyroxine   50 mcg Oral QAC breakfast   metoprolol  tartrate  12.5 mg Oral BID   PARoxetine   20 mg Oral Daily   Continuous Infusions:  azithromycin  250 mL/hr at 08/05/24 1602   cefTRIAXone  (ROCEPHIN )  IV Stopped (08/05/24 1356)   nitroGLYCERIN  Stopped (08/04/24 1938)          Sophie Mao, MD [facility] 08/06/2024, 8:23 AM

## 2024-08-06 NOTE — Progress Notes (Signed)
 Rounding Note   [name] Date of Encounter: 08/06/2024  [facility] Cardiologist: Vinie JAYSON Maxcy, MD   Subjective PT feeling better today   Scheduled Meds:  apixaban   2.5 mg Oral BID   atorvastatin   10 mg Oral Daily   levothyroxine   50 mcg Oral QAC breakfast   metoprolol  tartrate  12.5 mg Oral BID   PARoxetine   20 mg Oral Daily   Continuous Infusions:  azithromycin  250 mL/hr at 08/05/24 1602   cefTRIAXone  (ROCEPHIN )  IV Stopped (08/05/24 1356)   nitroGLYCERIN  Stopped (08/04/24 1938)   PRN Meds: acetaminophen  **OR** acetaminophen , melatonin, ondansetron  **OR** ondansetron  (ZOFRAN ) IV   Vital Signs  Vitals:   08/05/24 2202 08/06/24 0004 08/06/24 0442 08/06/24 0500  BP: 124/62 (!) 109/59 132/62   Pulse: 84 73 61   Resp: (!) 26 16 17    Temp:   98 F (36.7 C)   TempSrc:   Oral   SpO2: 96% 97% 96%   Weight:    69.6 kg    Intake/Output Summary (Last 24 hours) at 08/06/2024 0701 Last data filed at 08/06/2024 0330 Gross per 24 hour  Intake 1323.3 ml  Output 800 ml  Net 523.3 ml      08/06/2024    5:00 AM 08/02/2024   10:21 AM 07/27/2024    5:00 AM  Last 3 Weights  Weight (lbs) 153 lb 7 oz 152 lb 151 lb 10.8 oz  Weight (kg) 69.6 kg 68.947 kg 68.8 kg      Telemetry Afib   rates 80s to 110s    Converted at around 4 t o5  AM to  SR 70s  - Personally Reviewed  ECG  No new - Personally Reviewed  Physical Exam  GEN: No acute distress.    On 2 L [place] Neck: No JVD Cardiac: RRR, no murmur  Respiratory: Clear to auscultation GI: Soft, nontender, non-distended  MS: No edema;Labs High Sensitivity Troponin:   Recent Labs  Lab 07/21/24 1221 07/21/24 1406 08/04/24 1920 08/04/24 2127  TROPONINIHS 20* 13 37* 220*     Chemistry Recent Labs  Lab 08/04/24 1920 08/04/24 1929 08/04/24 2010 08/04/24 2016 08/05/24 0513 08/06/24 0401  NA 131* 133*   < > 131* 133* 134*  K 4.2 4.2   < > 4.2 4.0 3.8  CL 95* 96*  --   --  95* 100  CO2 25  --    --   --  27 27  GLUCOSE 180* 182*  --   --  165* 135*  BUN 47* 46*  --   --  44* 27*  CREATININE 1.38* 1.30*  --   --  1.53* 1.14*  CALCIUM  8.7*  --   --   --  8.3* 8.3*  MG  --   --   --   --  1.6* 1.9  PROT 6.8  --   --   --   --   --   ALBUMIN 3.1*  --   --   --   --   --   AST 32  --   --   --   --   --   ALT 23  --   --   --   --   --   ALKPHOS 79  --   --   --   --   --   BILITOT 0.9  --   --   --   --   --  GFRNONAA 36*  --   --   --  32* 45*  ANIONGAP 11  --   --   --  11 7   < > = values in this interval not displayed.    Lipids No results for input(s): CHOL, TRIG, HDL, LABVLDL, LDLCALC, CHOLHDL in the last 168 hours.  Hematology Recent Labs  Lab 08/04/24 1920 08/04/24 1929 08/04/24 2016 08/05/24 0513 08/06/24 0401  WBC 21.1*  --   --  20.3* 9.5  RBC 3.68*  --   --  3.22* 3.35*  HGB 11.5*   < > 13.3 10.0* 10.5*  HCT 35.4*   < > 39.0 30.0* 31.6*  MCV 96.2  --   --  93.2 94.3  MCH 31.3  --   --  31.1 31.3  MCHC 32.5  --   --  33.3 33.2  RDW 12.7  --   --  13.1 13.3  PLT 289  --   --  232 238   < > = values in this interval not displayed.   Thyroid  No results for input(s): TSH, FREET4 in the last 168 hours.  BNP Recent Labs  Lab 08/04/24 1920  BNP 363.5*    DDimer No results for input(s): DDIMER in the last 168 hours.   Radiology  DG Chest Port 1 View Result Date: 08/04/2024 CLINICAL DATA:  Shortness of breath EXAM: PORTABLE CHEST 1 VIEW COMPARISON:  07/21/2024 FINDINGS: Heart and mediastinal contours within normal limits. Bilateral airspace disease, diffuse throughout the left lung and most confluent on the right in the right lower lobe. Small left pleural effusion. No acute bony abnormality. IMPRESSION: Bilateral airspace disease, asymmetric to the left. This could reflect asymmetric edema or infection. Electronically Signed   By: Franky Crease M.D.   On: 08/04/2024 19:30    Cardiac Studies  Echo   06/2024 1. Left ventricular ejection  fraction, by estimation, is 70 to 75%. The  left ventricle has hyperdynamic function. The left ventricle has no  regional wall motion abnormalities. Left ventricular diastolic parameters  are indeterminate. Elevated left atrial   pressure.   2. Right ventricular systolic function is normal. The right ventricular  size is normal. Tricuspid regurgitation signal is inadequate for assessing  PA pressure.   3. The mitral valve is degenerative. Trivial mitral valve regurgitation.  No evidence of mitral stenosis.   4. The aortic valve is tricuspid. Aortic valve regurgitation is not  visualized. Aortic valve sclerosis/calcification is present, without any  evidence of aortic stenosis. Aortic valve Vmax measures 1.16 m/s.   5. The inferior vena cava is dilated in size with <50% respiratory  variability, suggesting right atrial pressure of 15 mmHg.    Patient Profile   89 y.o. female with a hx of HTN, HLD, paroxysmal atrial fibrillation/flutter who is being seen 08/05/2024 for the evaluation of CHF/elevated troponin at the request of Dr. Cheryle.   Assessment & Plan   1  Dyspnea  Improved from admit    OVerall appears may be because of ABX since she has been in afib some and has not diuresed   Volume overall looks OK I would ambulate today and follow sats and HR   At baseline she gets breathy with walking   BUT not SOB like on admi     2  PAF  Pt in/out of afib    Rates overall not that bad  However, she has not been walking much    Would ambulate and follow  If HR not excessive with walking would follow, keep on current medical Rx  Monitor ws sent last admit   She mailed in  Have not seen results   SPoke to patient  IF shows severe high/low HR and symptomatic may benefit from PPM   BUT this has not been demonstrated yet   Keep on Eliquis  2.5 bid   (she has sl bleeding from nose   Probably due to [place]  Could use saline nasal spray, humidify when in bed)  3  Elevated troponin   37 then 220   No  symptoms of CP   Most likely demand in setting of afib, possible infection.    I would not pursue further testing   For questions or updates, please contact [facility] Please consult [email or web address] for contact info under     Signed, Vina Gull, MD  08/06/2024, 7:01 AM

## 2024-08-06 NOTE — Progress Notes (Signed)
 This patient is receiving the antibiotic azithromycin  by the intravenous route.   Based on criteria approved by the Pharmacy and Therapeutics Committee, and the  Infectious Disease Division, the antibiotic(s) is / are being converted to equivalent oral dose form(s). These criteria include:  Patient being treated for a respiratory tract infection, urinary tract infection, cellulitis, or Clostridium Difficile associated diarrhea  The patient is not neutropenic and does not exhibit a GI malabsorption state  The patient is eating (either orally or per tube) and/or has been taking other orally administered medications for at least 24 hours.  The patient is improving clinically (physician assessment and a 24-hour Tmax of 100.5 F).   If you have questions about this conversion, please contact the pharmacy department.   Thank you for allowing pharmacy to be a part of this patient's care.  Shelba Collier, PharmD, BCPS Clinical Pharmacist

## 2024-08-06 NOTE — Progress Notes (Signed)
 Palliative Medicine Inpatient Follow Up Note   HPI: 89 y.o. female  with past medical history of CKD stage IIIa, hypertension, hyperlipidemia, hypothyroidism, depression, paroxysmal A-fib on Eliquis   admitted on 08/04/2024 with worsening shortness of breath. She shared that on the night she was admitted, she was at her baseline health status at her independent living facility [facility]) when she suddenly experienced shortness of breath. EMS was activated and she was brought to the hospital for further evaluation and care.   Worth to note that she was recently hospitalized from 07/21/2024 to 07/27/2024 for acute respiratory failure from A-fib with RVR and CHF exacerbation along with acute kidney injury.  Today's Discussion 08/06/2024  *Please note that this is a verbal dictation therefore any spelling or grammatical errors are due to the Dragon Medical One system interpretation.  Before meeting with the patient, I spent time reviewing the chart notes including nursing, hospitalist and cardiology notes from today. We also reviewed vital signs, nursing flowsheets, medication administrations record, and labs. Labs reviewed include: WBC Count: Decreased from 20.3 to 9.5, which is a significant improvement and likely indicates that the infectious process is resolving. Magnesium  Level: Increased from 1.6 to 1.9 after replacement, now back at baseline.  Created space and opportunity for patient to explore thoughts feelings and fears regarding current medical situation.  Patient was seen today and appears frail but alert and oriented, without any signs of acute distress. She reports feeling generally better and denies any pain, discomfort, chest pain, or palpitations. She expressed eagerness to be discharged and return home, stating a desire to be more active and not remain in bed. We discussed the importance of pacing herself during recovery despite her overall clinical improvement. She continues to  experience mild shortness of breath and fatigue with prolonged ambulation.  We also informed her that we were able to speak with her son, Aliene, yesterday and confirmed her full code status. The overall treatment plan remains unchanged.  Questions and concerns addressed.  Palliative Support Provided.   Objective Assessment: Vital Signs Vitals:   08/06/24 0742 08/06/24 0747  BP: (!) 158/69 (!) 141/69  Pulse: 71 62  Resp: 15 16  Temp: 97.9 F (36.6 C) 97.6 F (36.4 C)  SpO2: 92% 95%    Intake/Output Summary (Last 24 hours) at 08/06/2024 1055 Last data filed at 08/06/2024 0330 Gross per 24 hour  Intake 1203.3 ml  Output 800 ml  Net 403.3 ml   Last Weight  Most recent update: 08/06/2024  6:10 AM    Weight  69.6 kg (153 lb 7 oz)             Physical Exam  Vitals and nursing note reviewed.  Constitutional:      Appearance: Normal appearance.  HENT:     Head: Normocephalic and atraumatic.  Cardiovascular:     Rate and Rhythm: Normal rate. Rhythm irregular.  Pulmonary:     Effort: Pulmonary effort is normal.     Comments: Currently on 2LPM/[place] Musculoskeletal:     Comments: Generalized weakness Skin:    General: Skin is warm and dry.  Neurological:     General: No focal deficit present.     Mental Status: She is alert and oriented to person, place, and time.  SUMMARY OF RECOMMENDATIONS    Code Status: Full Code Continue to provide medical interventions as appropriate per patient/family's desire.  Provided psychosocial and emotional support PMT will continue to monitor and provide support    Symptom  Management:  Per attending team Continue with IV antibiotic for Pneumonia Continue with pulmonary hygiene Continue with O2 supplement at 2LPM/[place]    Billing based on MDM: High  Problems Addressed: One acute or chronic illness or injury that poses a threat to life or bodily function  Amount and/or Complexity of Data: Category 1:Review of prior external note(s)  from each unique source, Review of the result(s) of each unique test, and Assessment requiring an independent historian(s)  Risks: N/A ______________________________________________________________________________________ Kathlyne Bolder NP-C [facility] Palliative Medicine Team Team Cell Phone: [phone] Please utilize secure chat with additional questions, if there is no response within 30 minutes please call the above phone number  Palliative Medicine Team providers are available by phone from 7am to 7pm daily and can be reached through the team cell phone.  Should this patient require assistance outside of these hours, please call the patient's attending physician.

## 2024-08-07 DIAGNOSIS — J810 Acute pulmonary edema: Secondary | ICD-10-CM

## 2024-08-07 DIAGNOSIS — J189 Pneumonia, unspecified organism: Secondary | ICD-10-CM

## 2024-08-07 DIAGNOSIS — R0902 Hypoxemia: Secondary | ICD-10-CM | POA: Diagnosis not present

## 2024-08-07 DIAGNOSIS — R0603 Acute respiratory distress: Secondary | ICD-10-CM | POA: Diagnosis not present

## 2024-08-07 LAB — CBC WITH DIFFERENTIAL/PLATELET
Abs Immature Granulocytes: 0.04 K/uL (ref 0.00–0.07)
Basophils Absolute: 0.1 K/uL (ref 0.0–0.1)
Basophils Relative: 1 %
Eosinophils Absolute: 0.6 K/uL — ABNORMAL HIGH (ref 0.0–0.5)
Eosinophils Relative: 7 %
HCT: 29 % — ABNORMAL LOW (ref 36.0–46.0)
Hemoglobin: 9.5 g/dL — ABNORMAL LOW (ref 12.0–15.0)
Immature Granulocytes: 1 %
Lymphocytes Relative: 20 %
Lymphs Abs: 1.7 K/uL (ref 0.7–4.0)
MCH: 31.4 pg (ref 26.0–34.0)
MCHC: 32.8 g/dL (ref 30.0–36.0)
MCV: 95.7 fL (ref 80.0–100.0)
Monocytes Absolute: 0.9 K/uL (ref 0.1–1.0)
Monocytes Relative: 11 %
Neutro Abs: 5.2 K/uL (ref 1.7–7.7)
Neutrophils Relative %: 60 %
Platelets: 238 K/uL (ref 150–400)
RBC: 3.03 MIL/uL — ABNORMAL LOW (ref 3.87–5.11)
RDW: 13.5 % (ref 11.5–15.5)
WBC: 8.4 K/uL (ref 4.0–10.5)
nRBC: 0 % (ref 0.0–0.2)

## 2024-08-07 LAB — BASIC METABOLIC PANEL WITH GFR
Anion gap: 7 (ref 5–15)
BUN: 22 mg/dL (ref 8–23)
CO2: 26 mmol/L (ref 22–32)
Calcium: 8.4 mg/dL — ABNORMAL LOW (ref 8.9–10.3)
Chloride: 101 mmol/L (ref 98–111)
Creatinine, Ser: 1.06 mg/dL — ABNORMAL HIGH (ref 0.44–1.00)
GFR, Estimated: 50 mL/min — ABNORMAL LOW (ref >60)
Glucose, Bld: 109 mg/dL — ABNORMAL HIGH (ref 70–99)
Potassium: 3.9 mmol/L (ref 3.5–5.1)
Sodium: 134 mmol/L — ABNORMAL LOW (ref 135–145)

## 2024-08-07 LAB — MAGNESIUM: Magnesium: 1.9 mg/dL (ref 1.7–2.4)

## 2024-08-07 MED ORDER — SODIUM CHLORIDE 0.9 % IV SOLN
2.0000 g | INTRAVENOUS | Status: DC
  Administered 2024-08-07: 2 g via INTRAVENOUS
  Filled 2024-08-07: qty 20

## 2024-08-07 NOTE — Progress Notes (Signed)
 Mobility Specialist Progress Note:   08/07/24 0918  Mobility  Activity Ambulated with assistance  Level of Assistance Contact guard assist, steadying assist  Assistive Device Front wheel walker  Distance Ambulated (ft) 300 ft  Activity Response Tolerated well  Mobility Referral Yes  Mobility visit 1 Mobility  Mobility Specialist Start Time (ACUTE ONLY) [id number]  Mobility Specialist Stop Time (ACUTE ONLY) 0930  Mobility Specialist Time Calculation (min) (ACUTE ONLY) 12 min   Pt received on BSC, eager to walk. Ambulated in hallway with CGA and RW. Tolerated well, labored breathing near EOS. SpO2 81-87% on 2L. Increased O2 flow after pursed lip breathing attempts, SpO2 90-97% on 3L. Returned pt to room, sitting up in chair with all needs met.    Simon Aaberg Mobility Specialist Please contact via Special educational needs teacher or  Rehab office at [phone]

## 2024-08-07 NOTE — Progress Notes (Signed)
 Rounding Note   [name] Date of Encounter: 08/07/2024  [facility] Cardiologist: Vinie JAYSON Maxcy, MD   Subjective Breathing is OK   walked earlier   Having loose BMs   Scheduled Meds:  apixaban   2.5 mg Oral BID   atorvastatin   10 mg Oral Daily   azithromycin   500 mg Oral Daily   levothyroxine   50 mcg Oral QAC breakfast   metoprolol  tartrate  12.5 mg Oral BID   PARoxetine   20 mg Oral Daily   Continuous Infusions:  cefTRIAXone  (ROCEPHIN )  IV Stopped (08/06/24 1437)   nitroGLYCERIN  Stopped (08/04/24 1938)   PRN Meds: acetaminophen  **OR** acetaminophen , melatonin, ondansetron  **OR** ondansetron  (ZOFRAN ) IV   Vital Signs  Vitals:   08/07/24 0300 08/07/24 0400 08/07/24 0500 08/07/24 0600  BP:    (!) 141/65  Pulse:      Resp: 18 18 18 18   Temp:    98 F (36.7 C)  TempSrc:    Oral  SpO2:      Weight:        Intake/Output Summary (Last 24 hours) at 08/07/2024 0802 Last data filed at 08/06/2024 1500 Gross per 24 hour  Intake 100 ml  Output --  Net 100 ml      08/06/2024    5:00 AM 08/02/2024   10:21 AM 07/27/2024    5:00 AM  Last 3 Weights  Weight (lbs) 153 lb 7 oz 152 lb 151 lb 10.8 oz  Weight (kg) 69.6 kg 68.947 kg 68.8 kg      Telemetry Afib   rates 80s to 110s    Converted at around 4 t o5  AM to  SR 70s  - Personally Reviewed  ECG  No new - Personally Reviewed  Physical Exam  GEN: No acute distress.    On 2 L [place] Neck: No JVD Cardiac: RRR, no murmurs  Respiratory: Clear to auscultation MS: No edema;  Labs High Sensitivity Troponin:   Recent Labs  Lab 07/21/24 1221 07/21/24 1406 08/04/24 1920 08/04/24 2127  TROPONINIHS 20* 13 37* 220*     Chemistry Recent Labs  Lab 08/04/24 1920 08/04/24 1929 08/05/24 0513 08/06/24 0401 08/07/24 0314  NA 131*   < > 133* 134* 134*  K 4.2   < > 4.0 3.8 3.9  CL 95*   < > 95* 100 101  CO2 25  --  27 27 26   GLUCOSE 180*   < > 165* 135* 109*  BUN 47*   < > 44* 27* 22  CREATININE  1.38*   < > 1.53* 1.14* 1.06*  CALCIUM  8.7*  --  8.3* 8.3* 8.4*  MG  --   --  1.6* 1.9 1.9  PROT 6.8  --   --   --   --   ALBUMIN 3.1*  --   --   --   --   AST 32  --   --   --   --   ALT 23  --   --   --   --   ALKPHOS 79  --   --   --   --   BILITOT 0.9  --   --   --   --   GFRNONAA 36*  --  32* 45* 50*  ANIONGAP 11  --  11 7 7    < > = values in this interval not displayed.    Lipids No results for input(s): CHOL, TRIG, HDL, LABVLDL, LDLCALC, CHOLHDL  in the last 168 hours.  Hematology Recent Labs  Lab 08/05/24 0513 08/06/24 0401 08/07/24 0314  WBC 20.3* 9.5 8.4  RBC 3.22* 3.35* 3.03*  HGB 10.0* 10.5* 9.5*  HCT 30.0* 31.6* 29.0*  MCV 93.2 94.3 95.7  MCH 31.1 31.3 31.4  MCHC 33.3 33.2 32.8  RDW 13.1 13.3 13.5  PLT 232 238 238   Thyroid  No results for input(s): TSH, FREET4 in the last 168 hours.  BNP Recent Labs  Lab 08/04/24 1920  BNP 363.5*    DDimer No results for input(s): DDIMER in the last 168 hours.   Radiology  No results found.   Cardiac Studies  Echo   06/2024 1. Left ventricular ejection fraction, by estimation, is 70 to 75%. The  left ventricle has hyperdynamic function. The left ventricle has no  regional wall motion abnormalities. Left ventricular diastolic parameters  are indeterminate. Elevated left atrial   pressure.   2. Right ventricular systolic function is normal. The right ventricular  size is normal. Tricuspid regurgitation signal is inadequate for assessing  PA pressure.   3. The mitral valve is degenerative. Trivial mitral valve regurgitation.  No evidence of mitral stenosis.   4. The aortic valve is tricuspid. Aortic valve regurgitation is not  visualized. Aortic valve sclerosis/calcification is present, without any  evidence of aortic stenosis. Aortic valve Vmax measures 1.16 m/s.   5. The inferior vena cava is dilated in size with <50% respiratory  variability, suggesting right atrial pressure of 15 mmHg.     Patient Profile   89 y.o. female with a hx of HTN, HLD, paroxysmal atrial fibrillation/flutter who is being seen 08/05/2024 for the evaluation of CHF/elevated troponin at the request of Dr. Cheryle.   Assessment & Plan   1  Dyspnea  Improved from admit    OVerall appears may be because of ABX since she has been in afib some and has not diuresed   Volume overall looks OK With ambulation did ok   She remains in SR       2  PAF  Pt in/out of afib this admission    Note--Monitor was sent last admit   She mailed in  Have not seen results  Here her HR overall has been ok even when in afib, not excessive   IN SR now   I would keep on Eliquis  2.5 bid and low dose metoprolol     3  Elevated troponin   37 then 220   No symptoms of CP   Most likely demand in setting of afib, possible infection.    Pt asymptomatic  OK to d/c from cardiac standpoint    WIll make sure she has follow up   WIll sign off  For questions or updates, please contact [facility] Please consult [email or web address] for contact info under     Signed, Vina Gull, MD  08/07/2024, 8:02 AM

## 2024-08-07 NOTE — Discharge Instructions (Signed)

## 2024-08-07 NOTE — TOC Initial Note (Signed)
 Transition of Care (TOC) - Initial/Assessment Note  Rayfield Gobble RN, BSN Inpatient Care Management Unit 4E- RN Case Manager See Treatment Team for direct phone #   Patient Details  Name: Cheryl Peterson MRN: [id number] Date of Birth: [birth year]  Transition of Care [facility]) CM/SW Contact:    Gobble Rayfield Hurst, RN Phone Number: 08/07/2024, 3:17 PM  Clinical Narrative:                 [place] from [place] apartment at [facility].  CM spoke with pt at bedside for transition needs. Per pt she has home 02, concentrator and extra long tubing to reach around her apartment as well as portable 02. This was set up with [employer] on her last discharge. Baseline liter flow- 2L.  She also reports that she goes to therapy with [facility] who is in-house at [employer]- pt has never used [facility] services.  She also has an aide that has been set up by her son with [facility] to assist her 12hr/day 8a-8p. She voiced that she would like to get rid of this aide as she does not feel like the aide does anything to assist her. Cm advised pt to speak with her son as this service was arranged by family privately. Pt stated she would speak with her son.  Discussed with pt possibly having a nurse or someone come from [facility] for medication management and to follow up with her in her apartment- pt voiced she would like this to make sure she is taking her medication right and is using her home 02 correctly.   Call made to [facility]- confirmed pt is active with them for PT/OT needs. Per Angeline- they can have OT see pt in her apartment to follow for medication management needs. Also expressed to Angeline that pt needs a home eval to follow up on home management as well as following up with son regarding the arrangements that were made for the [facility] with [facility] care for aide services. Regina to follow up on patient's return home.   Pt will need HHPT/OT orders- CM will fax to [facility] at [facility] for resumption of services.    Pt declined any new DME needs. Bedside staff to check ambulatory 02 to make sure there is no change in home 02 liter flow needs.   IP CM (inpatient care management) will continue to follow.   Pt's son to transport home.   Expected Discharge Plan[facility] Barriers to Discharge: No Barriers Identified   Patient Goals and CMS Choice Patient states their goals for this hospitalization and ongoing recovery are:: return to [place] apartment at [facility] and remain independent   Choice offered to / list presented to : Patient      Expected Discharge Plan and Services   Discharge Planning Services: CM Consult Post Acute Care Choice: Home Health, Durable Medical Equipment, Resumption of Svcs/PTA Provider (Active with [facility]) Living arrangements for the past 2 months: Independent Living Facility                 DME Arranged: N/A DME Agency: NA       HH Arranged: PT, OT HH Agency: Other - See comment International aid/development worker at [employer]) Date HH Agency Contacted: 08/07/24 Time HH Agency Contacted: 1516 Representative spoke with at [facility] Agency: Angeline Piety  Prior Living Arrangements/Services Living arrangements for the past 2 months: Independent Living Facility Lives  with:: Self, Facility Resident Patient language and need for interpreter reviewed:: Yes Do you feel safe going back to the place where you live?: Yes      Need for Family Participation in Patient Care: Yes (Comment) Care giver support system in place?: Yes (comment) Current home services: DME (RW, [employer], home 02- [employer]) Criminal Activity/Legal Involvement Pertinent to Current Situation/Hospitalization: No - Comment as needed  Activities of Daily Living      Permission Sought/Granted Permission sought to share information with : Facility Industrial/product designer granted to share information with : Yes, Verbal Permission Granted      Permission granted to share info w AGENCY: [facility]        Emotional Assessment Appearance:: Appears stated age Attitude/Demeanor/Rapport: Engaged Affect (typically observed): Appropriate, Pleasant Orientation: : Oriented to Self, Oriented to Place, Oriented to  Time, Oriented to Situation Alcohol  / Substance Use: Not Applicable Psych Involvement: No (comment)  Admission diagnosis:  Acute respiratory distress [R06.03] Patient Active Problem List   Diagnosis Date Noted   Acute respiratory distress 08/04/2024   Malnutrition of moderate degree 07/26/2024   Hyponatremia 07/24/2024   AKI (acute kidney injury) (HCC) 07/24/2024   Acute hypoxic respiratory failure (HCC) 07/22/2024   Atypical atrial flutter (HCC)    Depression 11/24/2020   Essential hypertension 02/15/2016   CKD stage 3a, GFR 45-59 ml/min (HCC)    SOB (shortness of breath)    Hyperlipidemia    Varicose veins    Decreased diffusion capacity    PCP:  Loreli Elsie [name], MD Pharmacy:   [facility] [place], [place] - [address] [facility] [address] [place] [address] Phone: [phone] Fax: [phone]  [facility] [address] [place] [address] Phone: [phone] Fax: [phone]     Social Drivers of Health (SDOH) Social History: SDOH Screenings   Food Insecurity: No Food Insecurity (08/05/2024)  Housing: Low Risk  (08/05/2024)  Transportation Needs: No Transportation Needs (08/05/2024)  Utilities: Not At Risk (08/05/2024)  Social Connections: Unknown (08/05/2024)  Tobacco Use: Medium Risk (08/04/2024)   SDOH Interventions:     Readmission Risk Interventions    11/26/2021    3:23 PM  Readmission Risk Prevention Plan  Post Dischage Appt Complete  Medication Screening Complete  Transportation Screening Complete

## 2024-08-07 NOTE — Progress Notes (Signed)
 Nurse requested Mobility Specialist to perform oxygen saturation test with pt which includes removing pt from oxygen both at rest and while ambulating.  Below are the results from that testing.     Patient Saturations on Room Air at Rest = spO2 83%  Patient Saturations on Room Air while Ambulating = sp02 83% .  Rested and performed pursed lip breathing for 1 minute with sp02 at 84%.  Patient Saturations on 2 Liters of oxygen while Ambulating = sp02 91%  At end of testing pt left in room on 2  Liters of oxygen.  Reported results to nurse.   Arrington Yohe Mobility Specialist Please contact via Special educational needs teacher or  Rehab office at [phone]

## 2024-08-07 NOTE — Plan of Care (Signed)
     Referral previously received for Cy GORMAN Ruder for goals of care discussion. Noted most recent palliative in-person assessment dated 08/06/2024 at which time it was recommended to follow from a distance/chart check.   Chart reviewed for Recent provider notes, nurse notes, TOC notes, vitals, and labs and updates received from RN.   At this time patient appears Stable and is improving. No plan for in person follow-up at this time. Please contact the palliative medicine provider on service for any new/urgent needs that require our assistance with this patient.  Thank you for your referral and allowing PMT to assist in Desmond Tufano Bunte's care.   Kathlyne FALCON. Robi Dewolfe Jr., AGNP-C Palliative Medicine Team Phone: [phone]  NO CHARGE

## 2024-08-07 NOTE — Progress Notes (Addendum)
 [facility]                                                                              Cheryl Peterson, is a 89 y.o. female, DOB - 01/14/1930, FMW:[id number] Admit date - 08/04/2024    Outpatient Primary MD for the patient is Cheryl Peterson., MD  LOS - 3  days  Chief Complaint  Patient presents with   Respiratory Distress       Brief summary   Patient is a 89 year old female with history of CKD stage IIIb, hypertension, hyperlipidemia, hypothyroidism, depression, paroxysmal A-fib on Eliquis  with recent hospitalization from 07/21/2024-07/27/2024 for acute respiratory failure from A-fib with RVR and CHF exacerbation along with acute kidney injury with subsequent discharge off diuretics.  She presented with worsening shortness of breath.  On presentation, she required BiPAP which was subsequently taken off.  WBC of 21.1, creatinine of 1.38, BNP of 363.5, COVID/influenza/RSV PCR negative, high-sensitivity troponins of 37 with chest x-ray showing bilateral airspace disease.  She was started on IV antibiotics.  Cardiology and palliative care were consulted.    Assessment & Plan    Acute on chronic hypoxic respiratory failure Possible community-acquired pneumonia -Was recently discharged home on 2 L O2 via [place]  - Required BiPAP on presentation, now improving, O2 sats 96% on 2 L - Will do home O2 evaluation  - Procalcitonin 2.7 on admission, blood cultures negative so far  - Continue Rocephin  and Zithromax , will transition to p.o. antibiotics upon discharge, likely tomorrow. -Dyspnea improved from admission.   Positive troponins:  - Possibly from demand ischemia in the setting of acute illness - Evaluated by cardiology, recommended conservative management.  No acute EKG changes.     Paroxysmal A-fib - Has mild intermittent bradycardia.   -Continue low-dose metoprolol , Eliquis  2.5 mg twice daily   Chronic diastolic CHF - Currently compensated.  - Currently on fluid  restriction, received Lasix  20 mg IV x 1 on 9/7, discussed with Dr. Okey, recommended no diuretics upon discharge   CKD stage IIIb - Creatinine 1.53 on 9/8, has been downtrending  - currently stable.  Monitor.   Hyponatremia - Mild.  Monitor   Hypomagnesemia - Improved   Leukocytosis - Resolved   Hypertension Hyperlipidemia - Continue metoprolol  tartrate and statin   Depression -continue paroxetine    Hypothyroidism -Continue levothyroxine    Generalized debility, goals of care -PT elevation recommended home health PT, will obtain home O2 evaluation - Palliative care following.    Estimated body mass index is 27.18 kg/m as calculated from the following:   Height as of 08/02/24: 5' 3 (1.6 m).   Weight as of this encounter: 69.6 kg.  Code Status: Full code DVT Prophylaxis:  apixaban  (ELIQUIS ) tablet 2.5 mg Start: 08/04/24 2245 apixaban  (ELIQUIS ) tablet 2.5 mg   Level of Care: Level of care: Telemetry Cardiac Family Communication: Updated patient Disposition Plan:      Remains inpatient appropriate: Possible DC home tomorrow   Procedures:    Consultants:   Cardiology Palliative medicine  Antimicrobials:   Anti-infectives (From admission, onward)    Start     Dose/Rate Route Frequency Ordered  Stop   08/07/24 1400  cefTRIAXone  (ROCEPHIN ) 2 g in sodium chloride  0.9 % 100 mL IVPB        2 g 200 mL/hr over 30 Minutes Intravenous Every 24 hours 08/07/24 1022     08/06/24 1000  azithromycin  (ZITHROMAX ) tablet 500 mg        500 mg Oral Daily 08/06/24 0839     08/05/24 1430  azithromycin  (ZITHROMAX ) 500 mg in sodium chloride  0.9 % 250 mL IVPB  Status:  Discontinued        500 mg 250 mL/hr over 60 Minutes Intravenous Every 24 hours 08/05/24 0646 08/06/24 0839   08/05/24 1400  cefTRIAXone  (ROCEPHIN ) 1 g in sodium chloride  0.9 % 100 mL IVPB  Status:  Discontinued        1 g 200 mL/hr over 30 Minutes Intravenous Every 24 hours 08/05/24 0646 08/07/24 1022   08/04/24 1945   cefTRIAXone  (ROCEPHIN ) 2 g in sodium chloride  0.9 % 100 mL IVPB        2 g 200 mL/hr over 30 Minutes Intravenous  Once 08/04/24 1935 08/04/24 2039   08/04/24 1945  azithromycin  (ZITHROMAX ) 500 mg in sodium chloride  0.9 % 250 mL IVPB        500 mg 250 mL/hr over 60 Minutes Intravenous  Once 08/04/24 1935 08/04/24 2142          Medications  apixaban   2.5 mg Oral BID   atorvastatin   10 mg Oral Daily   azithromycin   500 mg Oral Daily   levothyroxine   50 mcg Oral QAC breakfast   metoprolol  tartrate  12.5 mg Oral BID   PARoxetine   20 mg Oral Daily      Subjective:   Cheryl Peterson was seen and examined today.  Feeling weak otherwise no acute complaints, no acute chest pain, shortness of breath, fever chills, nausea or vomiting.   No acute events overnight.    Objective:   Vitals:   08/07/24 0500 08/07/24 0600 08/07/24 0829 08/07/24 1225  BP:  (!) 141/65 (!) 158/83 (!) 156/67  Pulse:   69 60  Resp: 18 18 17 15   Temp:  98 F (36.7 C) 98.2 F (36.8 C) 98.3 F (36.8 C)  TempSrc:  Oral Oral Oral  SpO2:   91% 96%  Weight:        Intake/Output Summary (Last 24 hours) at 08/07/2024 1335 Last data filed at 08/06/2024 1500 Gross per 24 hour  Intake 100 ml  Output --  Net 100 ml     Wt Readings from Last 3 Encounters:  08/06/24 69.6 kg  08/02/24 68.9 kg  07/27/24 68.8 kg     Exam General: Alert and oriented x 3, NAD Cardiovascular: S1 S2 auscultated,  RRR Respiratory: Clear to auscultation bilaterally, no wheezing Gastrointestinal: Soft, nontender, nondistended, + bowel sounds Ext: no pedal edema bilaterally Neuro: No new deficits Psych: Normal affect     Data Reviewed:  I have personally reviewed following labs    CBC Lab Results  Component Value Date   WBC 8.4 08/07/2024   RBC 3.03 (L) 08/07/2024   HGB 9.5 (L) 08/07/2024   HCT 29.0 (L) 08/07/2024   MCV 95.7 08/07/2024   MCH 31.4 08/07/2024   PLT 238 08/07/2024   MCHC 32.8 08/07/2024   RDW 13.5  08/07/2024   LYMPHSABS 1.7 08/07/2024   MONOABS 0.9 08/07/2024   EOSABS 0.6 (H) 08/07/2024   BASOSABS 0.1 08/07/2024     Last metabolic panel Lab Results  Component  Value Date   NA 134 (L) 08/07/2024   K 3.9 08/07/2024   CL 101 08/07/2024   CO2 26 08/07/2024   BUN 22 08/07/2024   CREATININE 1.06 (H) 08/07/2024   GLUCOSE 109 (H) 08/07/2024   GFRNONAA 50 (L) 08/07/2024   GFRAA 53 (L) 05/01/2017   CALCIUM  8.4 (L) 08/07/2024   PROT 6.8 08/04/2024   ALBUMIN 3.1 (L) 08/04/2024   LABGLOB 2.7 07/19/2024   AGRATIO 1.4 01/03/2022   BILITOT 0.9 08/04/2024   ALKPHOS 79 08/04/2024   AST 32 08/04/2024   ALT 23 08/04/2024   ANIONGAP 7 08/07/2024    CBG (last 3)  No results for input(s): GLUCAP in the last 72 hours.    Coagulation Profile: No results for input(s): INR, PROTIME in the last 168 hours.   Radiology Studies: I have personally reviewed the imaging studies  No results found.     Nydia Distance M.D. [facility] 08/07/2024, 1:35 PM  Available via Epic secure chat 7am-7pm After 7 pm, please refer to night coverage provider listed on amion.

## 2024-08-07 NOTE — Progress Notes (Addendum)
  Attempted to call patients son for a return call, Dale at [phone], with no answer.  Kristofor Michalowski L Jackalynn Art, RN

## 2024-08-08 ENCOUNTER — Other Ambulatory Visit (HOSPITAL_COMMUNITY): Payer: Self-pay

## 2024-08-08 DIAGNOSIS — J189 Pneumonia, unspecified organism: Secondary | ICD-10-CM | POA: Diagnosis not present

## 2024-08-08 DIAGNOSIS — Z789 Other specified health status: Secondary | ICD-10-CM

## 2024-08-08 DIAGNOSIS — R0902 Hypoxemia: Secondary | ICD-10-CM | POA: Diagnosis not present

## 2024-08-08 DIAGNOSIS — J810 Acute pulmonary edema: Secondary | ICD-10-CM | POA: Diagnosis not present

## 2024-08-08 DIAGNOSIS — R0603 Acute respiratory distress: Secondary | ICD-10-CM | POA: Diagnosis not present

## 2024-08-08 DIAGNOSIS — Z66 Do not resuscitate: Secondary | ICD-10-CM

## 2024-08-08 LAB — POCT I-STAT 7, (LYTES, BLD GAS, ICA,H+H)
Acid-Base Excess: 1 mmol/L (ref 0.0–2.0)
Bicarbonate: 26.7 mmol/L (ref 20.0–28.0)
Calcium, Ion: 1.22 mmol/L (ref 1.15–1.40)
HCT: 33 % — ABNORMAL LOW (ref 36.0–46.0)
Hemoglobin: 11.2 g/dL — ABNORMAL LOW (ref 12.0–15.0)
O2 Saturation: 94 %
Patient temperature: 98.7
Potassium: 4.1 mmol/L (ref 3.5–5.1)
Sodium: 131 mmol/L — ABNORMAL LOW (ref 135–145)
TCO2: 28 mmol/L (ref 22–32)
pCO2 arterial: 48.2 mmHg — ABNORMAL HIGH (ref 32–48)
pH, Arterial: 7.352 (ref 7.35–7.45)
pO2, Arterial: 74 mmHg — ABNORMAL LOW (ref 83–108)

## 2024-08-08 MED ORDER — CEFPODOXIME PROXETIL 200 MG PO TABS
200.0000 mg | ORAL_TABLET | Freq: Two times a day (BID) | ORAL | 0 refills | Status: AC
  Filled 2024-08-08: qty 4, 2d supply, fill #0

## 2024-08-08 MED ORDER — METOPROLOL TARTRATE 12.5 MG HALF TABLET: 12.5000 mg | ORAL_TABLET | Freq: Two times a day (BID) | ORAL | Status: DC

## 2024-08-08 MED ORDER — METOPROLOL TARTRATE 25 MG PO TABS: 25.0000 mg | ORAL_TABLET | Freq: Two times a day (BID) | ORAL | Status: DC

## 2024-08-08 NOTE — Progress Notes (Signed)
 Palliative Medicine Inpatient Follow Up Note   HPI: 89 y.o. female  with past medical history of CKD stage IIIa, hypertension, hyperlipidemia, hypothyroidism, depression, paroxysmal A-fib on Eliquis   admitted on 08/04/2024 with worsening shortness of breath. She shared that on the night she was admitted, she was at her baseline health status at her independent living facility [facility]) when she suddenly experienced shortness of breath. EMS was activated and she was brought to the hospital for further evaluation and care.   Worth to note that she was recently hospitalized from 07/21/2024 to 07/27/2024 for acute respiratory failure from A-fib with RVR and CHF exacerbation along with acute kidney injury.  Today's Discussion 08/08/2024  *Please note that this is a verbal dictation therefore any spelling or grammatical errors are due to the Dragon Medical One system interpretation.  Before meeting with the patient, I spent time reviewing the chart notes including nursing, attending and cardiology notes from today. We also reviewed vital signs, nursing flowsheets, medication administrations record, and labs.  Created space and opportunity for patient to explore thoughts feelings and fears regarding current medical situation.  Visited patient at bedside today, niece Apolinar is also present. Patient is sitting on a recliner, overall doing well. Frail-looking but not in any form of acute distress. She reports that she is much improved respiratory-wise. Reports shortness of breath only when she over exerts and walks over extended period of time. She reports her breathing efforts is so much better compared to when she first came to the hospital. We discussed about pneumonia/chest infection clearing up, reason why she is feeling better, and we also discussed that her chronic diastolic CHF can also greatly affect her respiratory efforts. We discussed measures to prevent or decrease episodes of exertional  dyspnea, and to conserve and prioritized use of her energy as it may be limited caused by her chronic diseases. She verbalized understanding.   We revisited the patient's code status today. Previously designated as full code with full scope of treatment, she now expressed a clear preference to transition to DNR/DNI. She explicitly stated that she does not wish to undergo chest compressions, intubation, or other resuscitative measures, and would prefer to pass peacefully should her heart stop or she stop breathing.  We reviewed and completed the MOST form together, ensuring that selected interventions align with her values and wishes. Her son and [place], Aliene, was informed of this update and expressed agreement and support for the patient's decisions.  She is overall doing well and is ready to be discharged today.   Questions and concerns addressed.  Palliative Support Provided.   Objective Assessment: Vital Signs Vitals:   08/08/24 0313 08/08/24 0819  BP: 121/71 (!) 151/107  Pulse: 74 97  Resp: 20 18  Temp: 98.1 F (36.7 C) 98.5 F (36.9 C)  SpO2: 98% 94%    Intake/Output Summary (Last 24 hours) at 08/08/2024 0921 Last data filed at 08/08/2024 0824 Gross per 24 hour  Intake 1059.86 ml  Output --  Net 1059.86 ml   Last Weight  Most recent update: 08/08/2024  5:39 AM    Weight  67.5 kg (148 lb 13 oz)             Physical Exam  Vitals and nursing note reviewed.  Constitutional:      Appearance: Normal appearance.  HENT:     Head: Normocephalic and atraumatic.  Cardiovascular:     Rate and Rhythm: Normal rate. Rhythm irregular.  Pulmonary:  Effort: Pulmonary effort is normal.     Comments: Currently on 2LPM/[place] Musculoskeletal:     Comments: Generalized weakness Skin:    General: Skin is warm and dry.  Neurological:     General: No focal deficit present.     Mental Status: She is alert and oriented to person, place, and time.  SUMMARY OF RECOMMENDATIONS     Code Status: Changed to DNR-L MOST form completed that aligns with patient's goal and wishes.  Continue to provide medical interventions as appropriate per patient/family's desire.  Provided psychosocial and emotional support. Supports the PT recommendation for home PT, as well as OT for medication management.  PMT will continue to monitor and provide support.  Symptom Management:  Per attending team   Treatment plan discussed with patient, patient's son and niece, nursing and treatment team.   Time Spent: 50 minutes   ______________________________________________________________________________________ Kathlyne Bolder NP-C [facility] Team Team Cell Phone: [phone] Please utilize secure chat with additional questions, if there is no response within 30 minutes please call the above phone number  Palliative Medicine Team providers are available by phone from 7am to 7pm daily and can be reached through the team cell phone.  Should this patient require assistance outside of these hours, please call the patient's attending physician.

## 2024-08-08 NOTE — Plan of Care (Signed)
  Problem: Clinical Measurements: Goal: Ability to maintain clinical measurements within normal limits will improve Outcome: Progressing   Problem: Activity: Goal: Risk for activity intolerance will decrease Outcome: Progressing   Problem: Coping: Goal: Level of anxiety will decrease Outcome: Progressing   Problem: Safety: Goal: Ability to remain free from injury will improve Outcome: Progressing   

## 2024-08-08 NOTE — Discharge Summary (Signed)
 Physician Discharge Summary   Patient: Cheryl Peterson MRN: [id number] DOB: 22-Jan-1930  Admit date:     08/04/2024  Discharge date: 08/08/24  Discharge Physician: Nydia Distance, MD    PCP: Loreli Elsie [name], MD   Recommendations at discharge:   Continue Vantin  200 mg 2 times a day for 2 days, completed Zithromax  Eliquis  2.5 mg twice daily Metoprolol  12.5 mg twice daily, outpatient follow-up with cardiology recommended  Discharge Diagnoses:  Acute on chronic hypoxic respiratory failure Community-acquired pneumonia Positive troponin Paroxysmal A-fib    AKI (acute kidney injury) (HCC)   Essential hypertension   CKD stage 3a, GFR 45-59 ml/min [facility])   Hospital Course:  Patient is a 89 year old female with history of CKD stage IIIb, hypertension, hyperlipidemia, hypothyroidism, depression, paroxysmal A-fib on Eliquis  with recent hospitalization from 07/21/2024-07/27/2024 for acute respiratory failure from A-fib with RVR and CHF exacerbation along with acute kidney injury with subsequent discharge off diuretics.  She presented with worsening shortness of breath.  On presentation, she required BiPAP which was subsequently taken off.  WBC of 21.1, creatinine of 1.38, BNP of 363.5, COVID/influenza/RSV PCR negative, high-sensitivity troponins of 37 with chest x-ray showing bilateral airspace disease.  She was started on IV antibiotics.  Cardiology and palliative care were consulted.   Assessment and Plan:   Acute on chronic hypoxic respiratory failure Possible community-acquired pneumonia -Was recently discharged home on 2 L O2 via [place]  - Required BiPAP on presentation, now improving, O2 sats 96% on 2 L - Continue O2 2 L via [place] - Procalcitonin 2.7 on admission, blood cultures negative so far  - Patient was placed on IV Rocephin , Zithromax . -Dyspnea improved from admission.  Transition to oral antibiotics   Positive troponins:  - Possibly from demand ischemia in the setting of acute  illness - Evaluated by cardiology, recommended conservative management.  No acute EKG changes.     Paroxysmal A-fib - Has mild intermittent bradycardia.  Per cardiology, Dr. Okey, continue metoprolol  12.5 mg twice daily -Continue low-dose metoprolol , Eliquis  2.5 mg twice daily   Chronic diastolic CHF - Currently compensated.  - Currently on fluid restriction, received Lasix  20 mg IV x 1 on 9/7, discussed with Dr. Okey, recommended no diuretics upon discharge   Mild AKI on CKD stage IIIb - Creatinine 1.53 on 9/8, likely due to Lasix  has been downtrending  - currently stable.  Monitor.   Hyponatremia - Mild.  Monitor   Hypomagnesemia - Improved   Leukocytosis - Resolved   Hypertension Hyperlipidemia - Continue metoprolol  tartrate and statin   Depression -continue paroxetine    Hypothyroidism -Continue levothyroxine    Generalized debility, goals of care -PT elevation recommended home health PT, will obtain home O2 evaluation - Palliative care following.     Estimated body mass index is 27.18 kg/m as calculated from the following:   Height as of 08/02/24: 5' 3 (1.6 m).   Weight as of this encounter: 69.6 kg.       Pain control - [place]  Controlled Substance Reporting System database was reviewed. and patient was instructed, not to drive, operate heavy machinery, perform activities at heights, swimming or participation in water activities or provide baby-sitting services while on Pain, Sleep and Anxiety Medications; until their outpatient Physician has advised to do so again. Also recommended to not to take more than prescribed Pain, Sleep and Anxiety Medications.  Consultants: Cardiology Procedures performed: None Disposition: Assisted living Diet recommendation:  Discharge Diet Orders (From admission, onward)  Start     Ordered   08/08/24 0000  Diet - low sodium heart healthy        08/08/24 1126            DISCHARGE MEDICATION: Allergies as of  08/08/2024       Reactions   Penicillin G Sodium Swelling, Other (See Comments)   Lip numbness and swelling- no breathing issues, though Other reaction(s): lip numbness, swelling        Medication List     TAKE these medications    atorvastatin  10 MG tablet Commonly known as: LIPITOR Take 10 mg by mouth daily.   cefpodoxime  200 MG tablet Commonly known as: VANTIN  Take 1 tablet (200 mg total) by mouth 2 (two) times daily for 2 days.   Centrum Silver 50+Women Tabs Take 1 tablet by mouth daily.   Eliquis  2.5 MG Tabs tablet Generic drug: apixaban  Take 1 tablet (2.5 mg total) by mouth 2 (two) times daily.   levothyroxine  50 MCG tablet Commonly known as: SYNTHROID  Take 50 mcg by mouth daily before breakfast.   metoprolol  tartrate 25 MG tablet Commonly known as: LOPRESSOR  Take 0.5 tablets (12.5 mg total) by mouth 2 (two) times daily. What changed: how much to take   ondansetron  8 MG tablet Commonly known as: ZOFRAN  Take 0.5 tablets (4 mg total) by mouth 4 (four) times daily -  before meals and at bedtime. What changed: when to take this   PARoxetine  20 MG tablet Commonly known as: PAXIL  Take 20 mg by mouth daily with lunch.   Ure-Na 15 g Pack oral packet Generic drug: urea  Take 15 g by mouth daily. What changed:  when to take this additional instructions        Follow-up Information     [facility] Follow up.   Why: Home 02 provider Contact information: [address] [place] [address] [phone]         [facility] Follow up.   Why: They will provide PT/OT- and assist with medication management.        Loreli Elsie [name], MD. Schedule an appointment as soon as possible for a visit in 2 week(s).   Specialty: Internal Medicine Why: for hospital follow-up Contact information: [address] [place] [address] [phone]                Discharge Exam: Fredricka Weights   08/06/24 0500 08/08/24  0500  Weight: 69.6 kg 67.5 kg   S: No acute complaints, sitting up in the chair.  BP (!) 151/107 (BP Location: Right Arm)   Pulse 97   Temp 98.5 F (36.9 C) (Oral)   Resp 18   Ht 5' 3 (1.6 m)   Wt 67.5 kg   SpO2 94%   BMI 26.36 kg/m   Physical Exam General: Alert and oriented x 3, NAD Cardiovascular: Irregular Respiratory: CTAB, no wheezing Gastrointestinal: Soft, nontender, nondistended, NBS Ext: no pedal edema bilaterally Neuro: no new deficits Psych: Normal affect    Condition at discharge: fair  The results of significant diagnostics from this hospitalization (including imaging, microbiology, ancillary and laboratory) are listed below for reference.   Imaging Studies: DG Chest Port 1 View Result Date: 08/04/2024 CLINICAL DATA:  Shortness of breath EXAM: PORTABLE CHEST 1 VIEW COMPARISON:  07/21/2024 FINDINGS: Heart and mediastinal contours within normal limits. Bilateral airspace disease, diffuse throughout the left lung and most confluent on the right in the right lower lobe. Small left pleural  effusion. No acute bony abnormality. IMPRESSION: Bilateral airspace disease, asymmetric to the left. This could reflect asymmetric edema or infection. Electronically Signed   By: Franky Crease M.D.   On: 08/04/2024 19:30   ECHOCARDIOGRAM COMPLETE Result Date: 07/22/2024    ECHOCARDIOGRAM REPORT   [name] Date of Exam: 07/22/2024 Medical Rec #:  [id number]     Height:       63.0 in Accession #:    [id number]    Weight:       145.7 lb Date of Birth:  [birth year]    BSA:          1.690 m Patient Age:    89 years      BP:           104/46 mmHg Patient Gender: F             HR:           52 bpm. Exam Location:  Inpatient Procedure: 2D Echo, Cardiac Doppler and Color Doppler (Both Spectral and Color            Flow Doppler were utilized during procedure). Indications:    CHF-Acute Systolic 428.21 / I50.21  History:        Patient has prior history of Echocardiogram  examinations, most                 recent 11/04/2021. CKD, stage 3, Arrythmias:Atrial Fibrillation                 and Atrial Flutter, Signs/Symptoms:Chest Pain and Shortness of                 Breath; Risk Factors:Hypertension and Dyslipidemia.  Sonographer:    Thea Norlander RCS Referring Phys: TOBIE MODEST, V IMPRESSIONS  1. Left ventricular ejection fraction, by estimation, is 70 to 75%. The left ventricle has hyperdynamic function. The left ventricle has no regional wall motion abnormalities. Left ventricular diastolic parameters are indeterminate. Elevated left atrial  pressure.  2. Right ventricular systolic function is normal. The right ventricular size is normal. Tricuspid regurgitation signal is inadequate for assessing PA pressure.  3. The mitral valve is degenerative. Trivial mitral valve regurgitation. No evidence of mitral stenosis.  4. The aortic valve is tricuspid. Aortic valve regurgitation is not visualized. Aortic valve sclerosis/calcification is present, without any evidence of aortic stenosis. Aortic valve Vmax measures 1.16 m/s.  5. The inferior vena cava is dilated in size with <50% respiratory variability, suggesting right atrial pressure of 15 mmHg. FINDINGS  Left Ventricle: Left ventricular ejection fraction, by estimation, is 70 to 75%. The left ventricle has hyperdynamic function. The left ventricle has no regional wall motion abnormalities. The left ventricular internal cavity size was normal in size. There is no left ventricular hypertrophy. Left ventricular diastolic parameters are indeterminate. Elevated left atrial pressure. Right Ventricle: The right ventricular size is normal. No increase in right ventricular wall thickness. Right ventricular systolic function is normal. Tricuspid regurgitation signal is inadequate for assessing PA pressure. Left Atrium: Left atrial size was normal in size. Right Atrium: Right atrial size was normal in size. Pericardium: There is no evidence of  pericardial effusion. Mitral Valve: The mitral valve is degenerative in appearance. There is mild thickening of the mitral valve leaflet(s). There is mild calcification of the mitral valve leaflet(s). Mild to moderate mitral annular calcification. Trivial mitral valve regurgitation. No evidence of mitral valve stenosis. Tricuspid Valve: The tricuspid valve is normal  in structure. Tricuspid valve regurgitation is trivial. No evidence of tricuspid stenosis. Aortic Valve: The aortic valve is tricuspid. Aortic valve regurgitation is not visualized. Aortic valve sclerosis/calcification is present, without any evidence of aortic stenosis. Aortic valve peak gradient measures 5.4 mmHg. Pulmonic Valve: The pulmonic valve was normal in structure. Pulmonic valve regurgitation is not visualized. No evidence of pulmonic stenosis. Aorta: The aortic root is normal in size and structure. Venous: The inferior vena cava is dilated in size with less than 50% respiratory variability, suggesting right atrial pressure of 15 mmHg. IAS/Shunts: No atrial level shunt detected by color flow Doppler.  LEFT VENTRICLE PLAX 2D LVIDd:         3.40 cm   Diastology LVIDs:         1.60 cm   LV e' medial:    6.96 cm/s LV PW:         0.70 cm   LV E/e' medial:  18.8 LV IVS:        0.70 cm   LV e' lateral:   7.07 cm/s LVOT diam:     2.20 cm   LV E/e' lateral: 18.5 LV SV:         84 LV SV Index:   49 LVOT Area:     3.80 cm  RIGHT VENTRICLE             IVC RV S prime:     14.10 cm/s  IVC diam: 2.20 cm TAPSE (M-mode): 2.2 cm LEFT ATRIUM             Index        RIGHT ATRIUM           Index LA diam:        3.20 cm 1.89 cm/m   RA Area:     12.30 cm LA Vol (A2C):   45.9 ml 27.16 ml/m  RA Volume:   26.50 ml  15.68 ml/m LA Vol (A4C):   47.3 ml 27.98 ml/m LA Biplane Vol: 50.8 ml 30.06 ml/m  AORTIC VALVE AV Area (Vmax): 3.21 cm AV Vmax:        116.00 cm/s AV Peak Grad:   5.4 mmHg LVOT Vmax:      97.90 cm/s LVOT Vmean:     64.800 cm/s LVOT VTI:       0.220  m  AORTA Ao Root diam: 3.10 cm Ao Asc diam:  3.20 cm MITRAL VALVE MV Area (PHT): 3.48 cm     SHUNTS MV Decel Time: 218 msec     Systemic VTI:  0.22 m MV E velocity: 131.00 cm/s  Systemic Diam: 2.20 cm MV A velocity: 72.60 cm/s MV E/A ratio:  1.80 Wilbert Bihari MD Electronically signed by Wilbert Bihari MD Signature Date/Time: 07/22/2024/5:15:04 PM    Final    DG Chest Portable 1 View Result Date: 07/21/2024 CLINICAL DATA:  Shortness of breath. EXAM: PORTABLE CHEST 1 VIEW COMPARISON:  01/03/2022 FINDINGS: The cardio pericardial silhouette is enlarged. Vascular congestion diffuse interstitial opacity suggest interstitial edema. No acute bony abnormality. Telemetry leads overlie the chest. IMPRESSION: Enlargement of the cardiopericardial silhouette with vascular congestion and diffuse interstitial opacity suggesting interstitial edema. Electronically Signed   By: Camellia Candle M.D.   On: 07/21/2024 13:38    Microbiology: Results for orders placed or performed during the hospital encounter of 08/04/24  Resp panel by RT-PCR (RSV, Flu A&B, Covid) Anterior Nasal Swab     Status: None   Collection Time: 08/04/24  7:20  PM   Specimen: Anterior Nasal Swab  Result Value Ref Range Status   SARS Coronavirus 2 by RT PCR NEGATIVE NEGATIVE Final   Influenza A by PCR NEGATIVE NEGATIVE Final   Influenza B by PCR NEGATIVE NEGATIVE Final    Comment: (NOTE) The Xpert Xpress SARS-CoV-2/FLU/RSV plus assay is intended as an aid in the diagnosis of influenza from Nasopharyngeal swab specimens and should not be used as a sole basis for treatment. Nasal washings and aspirates are unacceptable for Xpert Xpress SARS-CoV-2/FLU/RSV testing.  Fact Sheet for Patients: BloggerCourse.com  Fact Sheet for Healthcare Providers: SeriousBroker.it  This test is not yet approved or cleared by the [place]  FDA and has been authorized for detection and/or diagnosis of SARS-CoV-2  by FDA under an Emergency Use Authorization (EUA). This EUA will remain in effect (meaning this test can be used) for the duration of the COVID-19 declaration under Section 564(b)(1) of the Act, 21 U.S.C. section 360bbb-3(b)(1), unless the authorization is terminated or revoked.     Resp Syncytial Virus by PCR NEGATIVE NEGATIVE Final    Comment: (NOTE) Fact Sheet for Patients: BloggerCourse.com  Fact Sheet for Healthcare Providers: SeriousBroker.it  This test is not yet approved or cleared by the [place]  FDA and has been authorized for detection and/or diagnosis of SARS-CoV-2 by FDA under an Emergency Use Authorization (EUA). This EUA will remain in effect (meaning this test can be used) for the duration of the COVID-19 declaration under Section 564(b)(1) of the Act, 21 U.S.C. section 360bbb-3(b)(1), unless the authorization is terminated or revoked.  Performed at [facility], [address], [place], [place] [address]   Blood culture (routine x 2)     Status: None (Preliminary result)   Collection Time: 08/04/24  8:21 PM   Specimen: BLOOD RIGHT FOREARM  Result Value Ref Range Status   Specimen Description BLOOD RIGHT FOREARM  Final   Special Requests   Final    BOTTLES DRAWN AEROBIC AND ANAEROBIC Blood Culture results may not be optimal due to an inadequate volume of blood received in culture bottles   Culture   Final    NO GROWTH 4 DAYS Performed at [facility], [address], [place], [place] [address]    Report Status PENDING  Incomplete  Blood culture (routine x 2)     Status: None (Preliminary result)   Collection Time: 08/04/24  8:21 PM   Specimen: BLOOD LEFT ARM  Result Value Ref Range Status   Specimen Description BLOOD LEFT ARM  Final   Special Requests   Final    BOTTLES DRAWN AEROBIC AND ANAEROBIC Blood Culture adequate volume   Culture   Final    NO GROWTH 4 DAYS Performed at [facility], [address] [place], [place] [address]    Report Status PENDING  Incomplete    Labs: CBC: Recent Labs  Lab 08/02/24 1234 08/04/24 1920 08/04/24 1929 08/04/24 2010 08/04/24 2016 08/05/24 0513 08/06/24 0401 08/07/24 0314  WBC 7.9 21.1*  --   --   --  20.3* 9.5 8.4  NEUTROABS  --   --   --   --   --   --  6.4 5.2  HGB 11.3 11.5*   < > 11.2*  11.2* 13.3 10.0* 10.5* 9.5*  HCT 34.7 35.4*   < > 33.0*  33.0* 39.0 30.0* 31.6* 29.0*  MCV 97 96.2  --   --   --  93.2 94.3 95.7  PLT 254 289  --   --   --  232 238 238   < > = values in this interval not displayed.   Basic Metabolic Panel: Recent Labs  Lab 08/02/24 1234 08/02/24 1234 08/04/24 1920 08/04/24 1929 08/04/24 2010 08/04/24 2016 08/05/24 0513 08/06/24 0401 08/07/24 0314  NA 132*   < > 131* 133* 131*  131* 131* 133* 134* 134*  K 5.3*  --  4.2 4.2 4.1  4.1 4.2 4.0 3.8 3.9  CL 92*  --  95* 96*  --   --  95* 100 101  CO2 25  --  25  --   --   --  27 27 26   GLUCOSE 107*  --  180* 182*  --   --  165* 135* 109*  BUN 35  --  47* 46*  --   --  44* 27* 22  CREATININE 1.37*  --  1.38* 1.30*  --   --  1.53* 1.14* 1.06*  CALCIUM  9.4  --  8.7*  --   --   --  8.3* 8.3* 8.4*  MG  --   --   --   --   --   --  1.6* 1.9 1.9   < > = values in this interval not displayed.   Liver Function Tests: Recent Labs  Lab 08/04/24 1920  AST 32  ALT 23  ALKPHOS 79  BILITOT 0.9  PROT 6.8  ALBUMIN 3.1*   CBG: No results for input(s): GLUCAP in the last 168 hours.  Discharge time spent: greater than 30 minutes.  Signed: Nydia Distance, MD [facility] 08/08/2024

## 2024-08-08 NOTE — Progress Notes (Addendum)
 Pt BP 151/107, HR 86-140s. Metoprolol  12.5mg  given, afib RVR. Pt sitting up on side of bed eating breakfast. Rai MD and Cardiology PA notified.

## 2024-08-08 NOTE — Progress Notes (Signed)
 Nurse requested Mobility Specialist to perform oxygen saturation test with pt which includes removing pt from oxygen both at rest and while ambulating.  Below are the results from that testing.     Patient Saturations on Room Air at Rest = spO2 94%  Patient Saturations on Room Air while Ambulating = sp02 87% .   Patient Saturations on 2 Liters of oxygen while Ambulating = sp02 93%  At end of testing pt left in room on 2  Liters of oxygen.  Reported results to nurse.    Mobility Specialist Please contact via Special educational needs teacher or Rehab Office [phone]

## 2024-08-08 NOTE — TOC Transition Note (Signed)
 Transition of Care (TOC) - Discharge Note Rayfield Gobble RN, BSN Inpatient Care Management Unit 4E- RN Case Manager See Treatment Team for direct phone #   Patient Details  Name: Cheryl Peterson MRN: [id number] Date of Birth: 11-May-1930  [facility]) CM/SW Contact:  Gobble Rayfield Hurst, RN Phone Number: 08/08/2024, 12:46 PM   Clinical Narrative:    Pt stable for transition home today back to [place] apartment at [facility]. Per pt her son will transport her.   [facility]PT/OT [facility].  Will fax d/c summary as well once available.   Cm spoke with pt at bedside- to let her know that [facility] will follow up with her for therapy needs as well as medication management. They will also follow up on her home 02 and made sure she understands how to use it.   Pt to follow up with her son and [facility] regarding her home aide that is coming and if she is going to continue services.   No further CM needs noted for discharge.      Final next level of care: Home w Home Health Services Barriers to Discharge: No Barriers Identified   Patient Goals and CMS Choice Patient states their goals for this hospitalization and ongoing recovery are:: return to [place] apartment at [facility] and remain independent   Choice offered to / list presented to : Patient      Discharge Placement               Home (ILF)        Discharge Plan and Services Additional resources added to the After Visit Summary for     Discharge Planning Services: CM Consult Post Acute Care Choice: Home Health, Durable Medical Equipment, Resumption of Svcs/PTA Provider (Active with [facility])          DME Arranged: N/A DME Agency: NA       HH Arranged: PT, OT HH Agency: Other - See comment International aid/development worker at [facility]) Date HH Agency Contacted: 08/07/24 Time HH Agency Contacted: 1516 Representative spoke with at [facility] Agency:  Angeline Piety  Social Drivers of Health (SDOH) Interventions SDOH Screenings   Food Insecurity: No Food Insecurity (08/05/2024)  Housing: Low Risk  (08/05/2024)  Transportation Needs: No Transportation Needs (08/05/2024)  Utilities: Not At Risk (08/05/2024)  Social Connections: Moderately Integrated (08/07/2024)  Tobacco Use: Medium Risk (08/04/2024)     Readmission Risk Interventions    08/08/2024   12:46 PM 11/26/2021    3:23 PM  Readmission Risk Prevention Plan  Post Dischage Appt  Complete  Medication Screening  Complete  Transportation Screening Complete Complete  Home Care Screening Complete   Medication Review (RN CM) Complete

## 2024-08-09 DIAGNOSIS — M62562 Muscle wasting and atrophy, not elsewhere classified, left lower leg: Secondary | ICD-10-CM | POA: Diagnosis not present

## 2024-08-09 DIAGNOSIS — R2681 Unsteadiness on feet: Secondary | ICD-10-CM | POA: Diagnosis not present

## 2024-08-09 DIAGNOSIS — M62561 Muscle wasting and atrophy, not elsewhere classified, right lower leg: Secondary | ICD-10-CM | POA: Diagnosis not present

## 2024-08-09 LAB — CULTURE, BLOOD (ROUTINE X 2)
Culture: NO GROWTH
Culture: NO GROWTH
Special Requests: ADEQUATE

## 2024-08-09 NOTE — Telephone Encounter (Signed)
 The patient has been notified of the result and verbalized understanding.  All questions (if any) were answered. Aleck LOISE Bill, RN 08/09/2024 3:04 PM  Labs ordered and release. Pt aware.

## 2024-08-09 NOTE — Telephone Encounter (Signed)
 Pt returning call

## 2024-08-09 NOTE — Telephone Encounter (Signed)
-----   Message from [place] JONELLE Cable sent at 08/09/2024  1:47 PM EDT -----

## 2024-08-12 DIAGNOSIS — R2681 Unsteadiness on feet: Secondary | ICD-10-CM | POA: Diagnosis not present

## 2024-08-12 DIAGNOSIS — R488 Other symbolic dysfunctions: Secondary | ICD-10-CM | POA: Diagnosis not present

## 2024-08-12 DIAGNOSIS — M62522 Muscle wasting and atrophy, not elsewhere classified, left upper arm: Secondary | ICD-10-CM | POA: Diagnosis not present

## 2024-08-12 DIAGNOSIS — M62521 Muscle wasting and atrophy, not elsewhere classified, right upper arm: Secondary | ICD-10-CM | POA: Diagnosis not present

## 2024-08-13 DIAGNOSIS — I480 Paroxysmal atrial fibrillation: Secondary | ICD-10-CM

## 2024-08-13 DIAGNOSIS — I484 Atypical atrial flutter: Secondary | ICD-10-CM

## 2024-08-13 DIAGNOSIS — M62521 Muscle wasting and atrophy, not elsewhere classified, right upper arm: Secondary | ICD-10-CM | POA: Diagnosis not present

## 2024-08-13 DIAGNOSIS — R41841 Cognitive communication deficit: Secondary | ICD-10-CM | POA: Diagnosis not present

## 2024-08-13 DIAGNOSIS — R4185 Anosognosia: Secondary | ICD-10-CM | POA: Diagnosis not present

## 2024-08-13 DIAGNOSIS — R4789 Other speech disturbances: Secondary | ICD-10-CM | POA: Diagnosis not present

## 2024-08-13 DIAGNOSIS — M62562 Muscle wasting and atrophy, not elsewhere classified, left lower leg: Secondary | ICD-10-CM | POA: Diagnosis not present

## 2024-08-13 DIAGNOSIS — M62561 Muscle wasting and atrophy, not elsewhere classified, right lower leg: Secondary | ICD-10-CM | POA: Diagnosis not present

## 2024-08-13 DIAGNOSIS — R498 Other voice and resonance disorders: Secondary | ICD-10-CM | POA: Diagnosis not present

## 2024-08-13 DIAGNOSIS — M62522 Muscle wasting and atrophy, not elsewhere classified, left upper arm: Secondary | ICD-10-CM | POA: Diagnosis not present

## 2024-08-13 DIAGNOSIS — R488 Other symbolic dysfunctions: Secondary | ICD-10-CM | POA: Diagnosis not present

## 2024-08-13 DIAGNOSIS — R2681 Unsteadiness on feet: Secondary | ICD-10-CM | POA: Diagnosis not present

## 2024-08-14 DIAGNOSIS — M62561 Muscle wasting and atrophy, not elsewhere classified, right lower leg: Secondary | ICD-10-CM | POA: Diagnosis not present

## 2024-08-14 DIAGNOSIS — R488 Other symbolic dysfunctions: Secondary | ICD-10-CM | POA: Diagnosis not present

## 2024-08-14 DIAGNOSIS — R2681 Unsteadiness on feet: Secondary | ICD-10-CM | POA: Diagnosis not present

## 2024-08-14 DIAGNOSIS — M62521 Muscle wasting and atrophy, not elsewhere classified, right upper arm: Secondary | ICD-10-CM | POA: Diagnosis not present

## 2024-08-14 DIAGNOSIS — M62562 Muscle wasting and atrophy, not elsewhere classified, left lower leg: Secondary | ICD-10-CM | POA: Diagnosis not present

## 2024-08-14 DIAGNOSIS — M62522 Muscle wasting and atrophy, not elsewhere classified, left upper arm: Secondary | ICD-10-CM | POA: Diagnosis not present

## 2024-08-15 DIAGNOSIS — R488 Other symbolic dysfunctions: Secondary | ICD-10-CM | POA: Diagnosis not present

## 2024-08-15 DIAGNOSIS — M62522 Muscle wasting and atrophy, not elsewhere classified, left upper arm: Secondary | ICD-10-CM | POA: Diagnosis not present

## 2024-08-15 DIAGNOSIS — M62562 Muscle wasting and atrophy, not elsewhere classified, left lower leg: Secondary | ICD-10-CM | POA: Diagnosis not present

## 2024-08-15 DIAGNOSIS — M62521 Muscle wasting and atrophy, not elsewhere classified, right upper arm: Secondary | ICD-10-CM | POA: Diagnosis not present

## 2024-08-15 DIAGNOSIS — R2681 Unsteadiness on feet: Secondary | ICD-10-CM | POA: Diagnosis not present

## 2024-08-15 DIAGNOSIS — M62561 Muscle wasting and atrophy, not elsewhere classified, right lower leg: Secondary | ICD-10-CM | POA: Diagnosis not present

## 2024-08-16 DIAGNOSIS — R4789 Other speech disturbances: Secondary | ICD-10-CM | POA: Diagnosis not present

## 2024-08-16 DIAGNOSIS — R4185 Anosognosia: Secondary | ICD-10-CM | POA: Diagnosis not present

## 2024-08-16 DIAGNOSIS — R41841 Cognitive communication deficit: Secondary | ICD-10-CM | POA: Diagnosis not present

## 2024-08-16 DIAGNOSIS — M62521 Muscle wasting and atrophy, not elsewhere classified, right upper arm: Secondary | ICD-10-CM | POA: Diagnosis not present

## 2024-08-16 DIAGNOSIS — R488 Other symbolic dysfunctions: Secondary | ICD-10-CM | POA: Diagnosis not present

## 2024-08-16 DIAGNOSIS — R2681 Unsteadiness on feet: Secondary | ICD-10-CM | POA: Diagnosis not present

## 2024-08-16 DIAGNOSIS — R498 Other voice and resonance disorders: Secondary | ICD-10-CM | POA: Diagnosis not present

## 2024-08-16 DIAGNOSIS — M62522 Muscle wasting and atrophy, not elsewhere classified, left upper arm: Secondary | ICD-10-CM | POA: Diagnosis not present

## 2024-08-19 DIAGNOSIS — R4789 Other speech disturbances: Secondary | ICD-10-CM | POA: Diagnosis not present

## 2024-08-19 DIAGNOSIS — R4185 Anosognosia: Secondary | ICD-10-CM | POA: Diagnosis not present

## 2024-08-19 DIAGNOSIS — M62522 Muscle wasting and atrophy, not elsewhere classified, left upper arm: Secondary | ICD-10-CM | POA: Diagnosis not present

## 2024-08-19 DIAGNOSIS — R498 Other voice and resonance disorders: Secondary | ICD-10-CM | POA: Diagnosis not present

## 2024-08-19 DIAGNOSIS — M62562 Muscle wasting and atrophy, not elsewhere classified, left lower leg: Secondary | ICD-10-CM | POA: Diagnosis not present

## 2024-08-19 DIAGNOSIS — M62521 Muscle wasting and atrophy, not elsewhere classified, right upper arm: Secondary | ICD-10-CM | POA: Diagnosis not present

## 2024-08-19 DIAGNOSIS — M62561 Muscle wasting and atrophy, not elsewhere classified, right lower leg: Secondary | ICD-10-CM | POA: Diagnosis not present

## 2024-08-19 DIAGNOSIS — R488 Other symbolic dysfunctions: Secondary | ICD-10-CM | POA: Diagnosis not present

## 2024-08-19 DIAGNOSIS — R41841 Cognitive communication deficit: Secondary | ICD-10-CM | POA: Diagnosis not present

## 2024-08-19 DIAGNOSIS — R2681 Unsteadiness on feet: Secondary | ICD-10-CM | POA: Diagnosis not present

## 2024-08-20 DIAGNOSIS — M62562 Muscle wasting and atrophy, not elsewhere classified, left lower leg: Secondary | ICD-10-CM | POA: Diagnosis not present

## 2024-08-20 DIAGNOSIS — R4789 Other speech disturbances: Secondary | ICD-10-CM | POA: Diagnosis not present

## 2024-08-20 DIAGNOSIS — R498 Other voice and resonance disorders: Secondary | ICD-10-CM | POA: Diagnosis not present

## 2024-08-20 DIAGNOSIS — R2681 Unsteadiness on feet: Secondary | ICD-10-CM | POA: Diagnosis not present

## 2024-08-20 DIAGNOSIS — M62522 Muscle wasting and atrophy, not elsewhere classified, left upper arm: Secondary | ICD-10-CM | POA: Diagnosis not present

## 2024-08-20 DIAGNOSIS — M62521 Muscle wasting and atrophy, not elsewhere classified, right upper arm: Secondary | ICD-10-CM | POA: Diagnosis not present

## 2024-08-20 DIAGNOSIS — R41841 Cognitive communication deficit: Secondary | ICD-10-CM | POA: Diagnosis not present

## 2024-08-20 DIAGNOSIS — R488 Other symbolic dysfunctions: Secondary | ICD-10-CM | POA: Diagnosis not present

## 2024-08-20 DIAGNOSIS — R4185 Anosognosia: Secondary | ICD-10-CM | POA: Diagnosis not present

## 2024-08-20 DIAGNOSIS — M62561 Muscle wasting and atrophy, not elsewhere classified, right lower leg: Secondary | ICD-10-CM | POA: Diagnosis not present

## 2024-08-21 DIAGNOSIS — R41841 Cognitive communication deficit: Secondary | ICD-10-CM | POA: Diagnosis not present

## 2024-08-21 DIAGNOSIS — R2681 Unsteadiness on feet: Secondary | ICD-10-CM | POA: Diagnosis not present

## 2024-08-21 DIAGNOSIS — R488 Other symbolic dysfunctions: Secondary | ICD-10-CM | POA: Diagnosis not present

## 2024-08-21 DIAGNOSIS — R498 Other voice and resonance disorders: Secondary | ICD-10-CM | POA: Diagnosis not present

## 2024-08-21 DIAGNOSIS — M62561 Muscle wasting and atrophy, not elsewhere classified, right lower leg: Secondary | ICD-10-CM | POA: Diagnosis not present

## 2024-08-21 DIAGNOSIS — M62562 Muscle wasting and atrophy, not elsewhere classified, left lower leg: Secondary | ICD-10-CM | POA: Diagnosis not present

## 2024-08-21 DIAGNOSIS — R4185 Anosognosia: Secondary | ICD-10-CM | POA: Diagnosis not present

## 2024-08-21 DIAGNOSIS — R4789 Other speech disturbances: Secondary | ICD-10-CM | POA: Diagnosis not present

## 2024-08-22 ENCOUNTER — Ambulatory Visit: Admitting: Cardiology

## 2024-08-22 ENCOUNTER — Telehealth: Payer: Self-pay | Admitting: Cardiology

## 2024-08-22 DIAGNOSIS — J189 Pneumonia, unspecified organism: Secondary | ICD-10-CM | POA: Diagnosis not present

## 2024-08-22 DIAGNOSIS — R5381 Other malaise: Secondary | ICD-10-CM | POA: Diagnosis not present

## 2024-08-22 DIAGNOSIS — N1831 Chronic kidney disease, stage 3a: Secondary | ICD-10-CM | POA: Diagnosis not present

## 2024-08-22 DIAGNOSIS — J9621 Acute and chronic respiratory failure with hypoxia: Secondary | ICD-10-CM | POA: Diagnosis not present

## 2024-08-22 DIAGNOSIS — N179 Acute kidney failure, unspecified: Secondary | ICD-10-CM | POA: Diagnosis not present

## 2024-08-22 DIAGNOSIS — I480 Paroxysmal atrial fibrillation: Secondary | ICD-10-CM | POA: Diagnosis not present

## 2024-08-22 DIAGNOSIS — I5032 Chronic diastolic (congestive) heart failure: Secondary | ICD-10-CM | POA: Diagnosis not present

## 2024-08-22 DIAGNOSIS — D6869 Other thrombophilia: Secondary | ICD-10-CM | POA: Diagnosis not present

## 2024-08-22 DIAGNOSIS — I130 Hypertensive heart and chronic kidney disease with heart failure and stage 1 through stage 4 chronic kidney disease, or unspecified chronic kidney disease: Secondary | ICD-10-CM | POA: Diagnosis not present

## 2024-08-22 NOTE — Telephone Encounter (Signed)
 Routed to pharmacy team re: eliquis  dose question

## 2024-08-22 NOTE — Telephone Encounter (Signed)
 Her Scr improved during admission and was back to 1.06 the day of discharge and 1.14 the day prior Per Dr. Okey note said to leave to Eliquis  2.5mg  BID Technically she meets criteria for 5mg .  Will defer to Dr. Okey or Dr. Mona

## 2024-08-22 NOTE — Telephone Encounter (Signed)
 Pt's niece, Rock Kale, came into office asking about clarification regarding Eliquis  dosage - states pt has been in hospital twice since last visit w/[facility] on 08/02/24. We are unable to disclose info to niece w/o proper documentation/permission from patient.  Please contact pt at: [phone] [facility]), Stacee Earp (son: [phone] - [place]), or Zykira Matlack (son: [phone] - [place], [place]).   JB, 08-22-24

## 2024-08-23 DIAGNOSIS — R2681 Unsteadiness on feet: Secondary | ICD-10-CM | POA: Diagnosis not present

## 2024-08-23 DIAGNOSIS — R488 Other symbolic dysfunctions: Secondary | ICD-10-CM | POA: Diagnosis not present

## 2024-08-23 DIAGNOSIS — M62522 Muscle wasting and atrophy, not elsewhere classified, left upper arm: Secondary | ICD-10-CM | POA: Diagnosis not present

## 2024-08-23 DIAGNOSIS — M62521 Muscle wasting and atrophy, not elsewhere classified, right upper arm: Secondary | ICD-10-CM | POA: Diagnosis not present

## 2024-08-26 DIAGNOSIS — R498 Other voice and resonance disorders: Secondary | ICD-10-CM | POA: Diagnosis not present

## 2024-08-26 DIAGNOSIS — M62522 Muscle wasting and atrophy, not elsewhere classified, left upper arm: Secondary | ICD-10-CM | POA: Diagnosis not present

## 2024-08-26 DIAGNOSIS — M62521 Muscle wasting and atrophy, not elsewhere classified, right upper arm: Secondary | ICD-10-CM | POA: Diagnosis not present

## 2024-08-26 DIAGNOSIS — R4789 Other speech disturbances: Secondary | ICD-10-CM | POA: Diagnosis not present

## 2024-08-26 DIAGNOSIS — R488 Other symbolic dysfunctions: Secondary | ICD-10-CM | POA: Diagnosis not present

## 2024-08-26 DIAGNOSIS — R41841 Cognitive communication deficit: Secondary | ICD-10-CM | POA: Diagnosis not present

## 2024-08-26 DIAGNOSIS — M62562 Muscle wasting and atrophy, not elsewhere classified, left lower leg: Secondary | ICD-10-CM | POA: Diagnosis not present

## 2024-08-26 DIAGNOSIS — M62561 Muscle wasting and atrophy, not elsewhere classified, right lower leg: Secondary | ICD-10-CM | POA: Diagnosis not present

## 2024-08-26 DIAGNOSIS — R4185 Anosognosia: Secondary | ICD-10-CM | POA: Diagnosis not present

## 2024-08-26 DIAGNOSIS — R2681 Unsteadiness on feet: Secondary | ICD-10-CM | POA: Diagnosis not present

## 2024-08-26 MED ORDER — APIXABAN 5 MG PO TABS: 5.0000 mg | ORAL_TABLET | Freq: Two times a day (BID) | ORAL | 1 refills | Status: DC

## 2024-08-26 NOTE — Telephone Encounter (Signed)
 Spoke with son. Advised of dose change of Eliquis . Rx(s) sent to pharmacy electronically.

## 2024-08-26 NOTE — Telephone Encounter (Signed)
 Cheryl Vinie BROCKS, MD to Cheryl Peterson, Cheryl Peterson, RPH-CPP  Cheryl Lakey M, RN  (Selected Message)    08/22/24  6:08 PM Yes .Cheryl Peterson She should be 5 mg BID - renal function improved. I guess the recommendation was based on her advanced age, but no clear evidence of bleeding or other high risk features. Still having afib during admission, so risk of stroke elevated if she is underdosed. Please change to 5 mg BID.   -[place]

## 2024-08-27 DIAGNOSIS — M62561 Muscle wasting and atrophy, not elsewhere classified, right lower leg: Secondary | ICD-10-CM | POA: Diagnosis not present

## 2024-08-27 DIAGNOSIS — M62562 Muscle wasting and atrophy, not elsewhere classified, left lower leg: Secondary | ICD-10-CM | POA: Diagnosis not present

## 2024-08-27 DIAGNOSIS — R4185 Anosognosia: Secondary | ICD-10-CM | POA: Diagnosis not present

## 2024-08-27 DIAGNOSIS — R488 Other symbolic dysfunctions: Secondary | ICD-10-CM | POA: Diagnosis not present

## 2024-08-27 DIAGNOSIS — R41841 Cognitive communication deficit: Secondary | ICD-10-CM | POA: Diagnosis not present

## 2024-08-27 DIAGNOSIS — R2681 Unsteadiness on feet: Secondary | ICD-10-CM | POA: Diagnosis not present

## 2024-08-27 DIAGNOSIS — R4789 Other speech disturbances: Secondary | ICD-10-CM | POA: Diagnosis not present

## 2024-08-27 DIAGNOSIS — R498 Other voice and resonance disorders: Secondary | ICD-10-CM | POA: Diagnosis not present

## 2024-08-28 DIAGNOSIS — M62522 Muscle wasting and atrophy, not elsewhere classified, left upper arm: Secondary | ICD-10-CM | POA: Diagnosis not present

## 2024-08-28 DIAGNOSIS — M62521 Muscle wasting and atrophy, not elsewhere classified, right upper arm: Secondary | ICD-10-CM | POA: Diagnosis not present

## 2024-08-28 DIAGNOSIS — R41841 Cognitive communication deficit: Secondary | ICD-10-CM | POA: Diagnosis not present

## 2024-08-28 DIAGNOSIS — I129 Hypertensive chronic kidney disease with stage 1 through stage 4 chronic kidney disease, or unspecified chronic kidney disease: Secondary | ICD-10-CM | POA: Diagnosis not present

## 2024-08-28 DIAGNOSIS — M62562 Muscle wasting and atrophy, not elsewhere classified, left lower leg: Secondary | ICD-10-CM | POA: Diagnosis not present

## 2024-08-28 DIAGNOSIS — M62561 Muscle wasting and atrophy, not elsewhere classified, right lower leg: Secondary | ICD-10-CM | POA: Diagnosis not present

## 2024-08-28 DIAGNOSIS — E785 Hyperlipidemia, unspecified: Secondary | ICD-10-CM | POA: Diagnosis not present

## 2024-08-28 DIAGNOSIS — E039 Hypothyroidism, unspecified: Secondary | ICD-10-CM | POA: Diagnosis not present

## 2024-08-28 DIAGNOSIS — R4789 Other speech disturbances: Secondary | ICD-10-CM | POA: Diagnosis not present

## 2024-08-28 DIAGNOSIS — R4185 Anosognosia: Secondary | ICD-10-CM | POA: Diagnosis not present

## 2024-08-28 DIAGNOSIS — R498 Other voice and resonance disorders: Secondary | ICD-10-CM | POA: Diagnosis not present

## 2024-08-28 DIAGNOSIS — R488 Other symbolic dysfunctions: Secondary | ICD-10-CM | POA: Diagnosis not present

## 2024-08-28 DIAGNOSIS — R7301 Impaired fasting glucose: Secondary | ICD-10-CM | POA: Diagnosis not present

## 2024-08-28 DIAGNOSIS — R2681 Unsteadiness on feet: Secondary | ICD-10-CM | POA: Diagnosis not present

## 2024-08-29 DIAGNOSIS — M62521 Muscle wasting and atrophy, not elsewhere classified, right upper arm: Secondary | ICD-10-CM | POA: Diagnosis not present

## 2024-08-29 DIAGNOSIS — R488 Other symbolic dysfunctions: Secondary | ICD-10-CM | POA: Diagnosis not present

## 2024-08-29 DIAGNOSIS — M62522 Muscle wasting and atrophy, not elsewhere classified, left upper arm: Secondary | ICD-10-CM | POA: Diagnosis not present

## 2024-08-29 DIAGNOSIS — R2681 Unsteadiness on feet: Secondary | ICD-10-CM | POA: Diagnosis not present

## 2024-08-30 DIAGNOSIS — R2681 Unsteadiness on feet: Secondary | ICD-10-CM | POA: Diagnosis not present

## 2024-09-02 DIAGNOSIS — R498 Other voice and resonance disorders: Secondary | ICD-10-CM | POA: Diagnosis not present

## 2024-09-02 DIAGNOSIS — R41841 Cognitive communication deficit: Secondary | ICD-10-CM | POA: Diagnosis not present

## 2024-09-02 DIAGNOSIS — M62562 Muscle wasting and atrophy, not elsewhere classified, left lower leg: Secondary | ICD-10-CM | POA: Diagnosis not present

## 2024-09-02 DIAGNOSIS — R4185 Anosognosia: Secondary | ICD-10-CM | POA: Diagnosis not present

## 2024-09-02 DIAGNOSIS — M62561 Muscle wasting and atrophy, not elsewhere classified, right lower leg: Secondary | ICD-10-CM | POA: Diagnosis not present

## 2024-09-02 DIAGNOSIS — R488 Other symbolic dysfunctions: Secondary | ICD-10-CM | POA: Diagnosis not present

## 2024-09-02 DIAGNOSIS — R4789 Other speech disturbances: Secondary | ICD-10-CM | POA: Diagnosis not present

## 2024-09-02 DIAGNOSIS — R2681 Unsteadiness on feet: Secondary | ICD-10-CM | POA: Diagnosis not present

## 2024-09-03 DIAGNOSIS — M62561 Muscle wasting and atrophy, not elsewhere classified, right lower leg: Secondary | ICD-10-CM | POA: Diagnosis not present

## 2024-09-04 DIAGNOSIS — I129 Hypertensive chronic kidney disease with stage 1 through stage 4 chronic kidney disease, or unspecified chronic kidney disease: Secondary | ICD-10-CM | POA: Diagnosis not present

## 2024-09-04 DIAGNOSIS — I480 Paroxysmal atrial fibrillation: Secondary | ICD-10-CM | POA: Diagnosis not present

## 2024-09-04 DIAGNOSIS — H353220 Exudative age-related macular degeneration, left eye, stage unspecified: Secondary | ICD-10-CM | POA: Diagnosis not present

## 2024-09-04 DIAGNOSIS — Z0000 Encounter for general adult medical examination without abnormal findings: Secondary | ICD-10-CM | POA: Diagnosis not present

## 2024-09-04 DIAGNOSIS — I5032 Chronic diastolic (congestive) heart failure: Secondary | ICD-10-CM | POA: Diagnosis not present

## 2024-09-04 DIAGNOSIS — Z860101 Personal history of adenomatous and serrated colon polyps: Secondary | ICD-10-CM | POA: Diagnosis not present

## 2024-09-04 DIAGNOSIS — E785 Hyperlipidemia, unspecified: Secondary | ICD-10-CM | POA: Diagnosis not present

## 2024-09-04 DIAGNOSIS — J9611 Chronic respiratory failure with hypoxia: Secondary | ICD-10-CM | POA: Diagnosis not present

## 2024-09-04 DIAGNOSIS — R2681 Unsteadiness on feet: Secondary | ICD-10-CM | POA: Diagnosis not present

## 2024-09-04 DIAGNOSIS — M62521 Muscle wasting and atrophy, not elsewhere classified, right upper arm: Secondary | ICD-10-CM | POA: Diagnosis not present

## 2024-09-04 DIAGNOSIS — I6509 Occlusion and stenosis of unspecified vertebral artery: Secondary | ICD-10-CM | POA: Diagnosis not present

## 2024-09-04 DIAGNOSIS — Z1339 Encounter for screening examination for other mental health and behavioral disorders: Secondary | ICD-10-CM | POA: Diagnosis not present

## 2024-09-04 DIAGNOSIS — I10 Essential (primary) hypertension: Secondary | ICD-10-CM | POA: Diagnosis not present

## 2024-09-04 DIAGNOSIS — N1831 Chronic kidney disease, stage 3a: Secondary | ICD-10-CM | POA: Diagnosis not present

## 2024-09-04 DIAGNOSIS — Z1331 Encounter for screening for depression: Secondary | ICD-10-CM | POA: Diagnosis not present

## 2024-09-04 DIAGNOSIS — M62522 Muscle wasting and atrophy, not elsewhere classified, left upper arm: Secondary | ICD-10-CM | POA: Diagnosis not present

## 2024-09-04 DIAGNOSIS — R488 Other symbolic dysfunctions: Secondary | ICD-10-CM | POA: Diagnosis not present

## 2024-09-04 DIAGNOSIS — Z23 Encounter for immunization: Secondary | ICD-10-CM | POA: Diagnosis not present

## 2024-09-04 DIAGNOSIS — R82998 Other abnormal findings in urine: Secondary | ICD-10-CM | POA: Diagnosis not present

## 2024-09-05 DIAGNOSIS — R4185 Anosognosia: Secondary | ICD-10-CM | POA: Diagnosis not present

## 2024-09-05 DIAGNOSIS — R488 Other symbolic dysfunctions: Secondary | ICD-10-CM | POA: Diagnosis not present

## 2024-09-05 DIAGNOSIS — R4789 Other speech disturbances: Secondary | ICD-10-CM | POA: Diagnosis not present

## 2024-09-05 DIAGNOSIS — R498 Other voice and resonance disorders: Secondary | ICD-10-CM | POA: Diagnosis not present

## 2024-09-05 DIAGNOSIS — R41841 Cognitive communication deficit: Secondary | ICD-10-CM | POA: Diagnosis not present

## 2024-09-06 DIAGNOSIS — R2681 Unsteadiness on feet: Secondary | ICD-10-CM | POA: Diagnosis not present

## 2024-09-06 DIAGNOSIS — R498 Other voice and resonance disorders: Secondary | ICD-10-CM | POA: Diagnosis not present

## 2024-09-06 DIAGNOSIS — M62521 Muscle wasting and atrophy, not elsewhere classified, right upper arm: Secondary | ICD-10-CM | POA: Diagnosis not present

## 2024-09-06 DIAGNOSIS — M62562 Muscle wasting and atrophy, not elsewhere classified, left lower leg: Secondary | ICD-10-CM | POA: Diagnosis not present

## 2024-09-06 DIAGNOSIS — R4185 Anosognosia: Secondary | ICD-10-CM | POA: Diagnosis not present

## 2024-09-06 DIAGNOSIS — R4789 Other speech disturbances: Secondary | ICD-10-CM | POA: Diagnosis not present

## 2024-09-06 DIAGNOSIS — M62522 Muscle wasting and atrophy, not elsewhere classified, left upper arm: Secondary | ICD-10-CM | POA: Diagnosis not present

## 2024-09-06 DIAGNOSIS — M62561 Muscle wasting and atrophy, not elsewhere classified, right lower leg: Secondary | ICD-10-CM | POA: Diagnosis not present

## 2024-09-06 DIAGNOSIS — R41841 Cognitive communication deficit: Secondary | ICD-10-CM | POA: Diagnosis not present

## 2024-09-06 DIAGNOSIS — R488 Other symbolic dysfunctions: Secondary | ICD-10-CM | POA: Diagnosis not present

## 2024-09-09 DIAGNOSIS — M62562 Muscle wasting and atrophy, not elsewhere classified, left lower leg: Secondary | ICD-10-CM | POA: Diagnosis not present

## 2024-09-09 DIAGNOSIS — R4185 Anosognosia: Secondary | ICD-10-CM | POA: Diagnosis not present

## 2024-09-09 DIAGNOSIS — M62522 Muscle wasting and atrophy, not elsewhere classified, left upper arm: Secondary | ICD-10-CM | POA: Diagnosis not present

## 2024-09-09 DIAGNOSIS — R488 Other symbolic dysfunctions: Secondary | ICD-10-CM | POA: Diagnosis not present

## 2024-09-09 DIAGNOSIS — R4789 Other speech disturbances: Secondary | ICD-10-CM | POA: Diagnosis not present

## 2024-09-09 DIAGNOSIS — M62521 Muscle wasting and atrophy, not elsewhere classified, right upper arm: Secondary | ICD-10-CM | POA: Diagnosis not present

## 2024-09-09 DIAGNOSIS — M62561 Muscle wasting and atrophy, not elsewhere classified, right lower leg: Secondary | ICD-10-CM | POA: Diagnosis not present

## 2024-09-09 DIAGNOSIS — R2681 Unsteadiness on feet: Secondary | ICD-10-CM | POA: Diagnosis not present

## 2024-09-09 DIAGNOSIS — R41841 Cognitive communication deficit: Secondary | ICD-10-CM | POA: Diagnosis not present

## 2024-09-09 DIAGNOSIS — R498 Other voice and resonance disorders: Secondary | ICD-10-CM | POA: Diagnosis not present

## 2024-09-10 DIAGNOSIS — R488 Other symbolic dysfunctions: Secondary | ICD-10-CM | POA: Diagnosis not present

## 2024-09-10 DIAGNOSIS — R41841 Cognitive communication deficit: Secondary | ICD-10-CM | POA: Diagnosis not present

## 2024-09-10 DIAGNOSIS — R2681 Unsteadiness on feet: Secondary | ICD-10-CM | POA: Diagnosis not present

## 2024-09-10 DIAGNOSIS — M62521 Muscle wasting and atrophy, not elsewhere classified, right upper arm: Secondary | ICD-10-CM | POA: Diagnosis not present

## 2024-09-10 DIAGNOSIS — R4185 Anosognosia: Secondary | ICD-10-CM | POA: Diagnosis not present

## 2024-09-10 DIAGNOSIS — R498 Other voice and resonance disorders: Secondary | ICD-10-CM | POA: Diagnosis not present

## 2024-09-10 DIAGNOSIS — R4789 Other speech disturbances: Secondary | ICD-10-CM | POA: Diagnosis not present

## 2024-09-10 DIAGNOSIS — M62561 Muscle wasting and atrophy, not elsewhere classified, right lower leg: Secondary | ICD-10-CM | POA: Diagnosis not present

## 2024-09-10 DIAGNOSIS — M62522 Muscle wasting and atrophy, not elsewhere classified, left upper arm: Secondary | ICD-10-CM | POA: Diagnosis not present

## 2024-09-10 DIAGNOSIS — M62562 Muscle wasting and atrophy, not elsewhere classified, left lower leg: Secondary | ICD-10-CM | POA: Diagnosis not present

## 2024-09-11 DIAGNOSIS — M62522 Muscle wasting and atrophy, not elsewhere classified, left upper arm: Secondary | ICD-10-CM | POA: Diagnosis not present

## 2024-09-13 DIAGNOSIS — R488 Other symbolic dysfunctions: Secondary | ICD-10-CM | POA: Diagnosis not present

## 2024-09-13 DIAGNOSIS — R498 Other voice and resonance disorders: Secondary | ICD-10-CM | POA: Diagnosis not present

## 2024-09-13 DIAGNOSIS — R41841 Cognitive communication deficit: Secondary | ICD-10-CM | POA: Diagnosis not present

## 2024-09-13 DIAGNOSIS — R4185 Anosognosia: Secondary | ICD-10-CM | POA: Diagnosis not present

## 2024-09-13 DIAGNOSIS — R4789 Other speech disturbances: Secondary | ICD-10-CM | POA: Diagnosis not present

## 2024-09-16 DIAGNOSIS — R4789 Other speech disturbances: Secondary | ICD-10-CM | POA: Diagnosis not present

## 2024-09-16 DIAGNOSIS — R4185 Anosognosia: Secondary | ICD-10-CM | POA: Diagnosis not present

## 2024-09-16 DIAGNOSIS — M62521 Muscle wasting and atrophy, not elsewhere classified, right upper arm: Secondary | ICD-10-CM | POA: Diagnosis not present

## 2024-09-16 DIAGNOSIS — R498 Other voice and resonance disorders: Secondary | ICD-10-CM | POA: Diagnosis not present

## 2024-09-16 DIAGNOSIS — M62562 Muscle wasting and atrophy, not elsewhere classified, left lower leg: Secondary | ICD-10-CM | POA: Diagnosis not present

## 2024-09-16 DIAGNOSIS — R41841 Cognitive communication deficit: Secondary | ICD-10-CM | POA: Diagnosis not present

## 2024-09-16 DIAGNOSIS — M62522 Muscle wasting and atrophy, not elsewhere classified, left upper arm: Secondary | ICD-10-CM | POA: Diagnosis not present

## 2024-09-16 DIAGNOSIS — M62561 Muscle wasting and atrophy, not elsewhere classified, right lower leg: Secondary | ICD-10-CM | POA: Diagnosis not present

## 2024-09-16 DIAGNOSIS — R2681 Unsteadiness on feet: Secondary | ICD-10-CM | POA: Diagnosis not present

## 2024-09-16 DIAGNOSIS — R488 Other symbolic dysfunctions: Secondary | ICD-10-CM | POA: Diagnosis not present

## 2024-09-17 DIAGNOSIS — R488 Other symbolic dysfunctions: Secondary | ICD-10-CM | POA: Diagnosis not present

## 2024-09-17 DIAGNOSIS — M62561 Muscle wasting and atrophy, not elsewhere classified, right lower leg: Secondary | ICD-10-CM | POA: Diagnosis not present

## 2024-09-17 DIAGNOSIS — M62562 Muscle wasting and atrophy, not elsewhere classified, left lower leg: Secondary | ICD-10-CM | POA: Diagnosis not present

## 2024-09-17 DIAGNOSIS — R2681 Unsteadiness on feet: Secondary | ICD-10-CM | POA: Diagnosis not present

## 2024-09-17 DIAGNOSIS — M62521 Muscle wasting and atrophy, not elsewhere classified, right upper arm: Secondary | ICD-10-CM | POA: Diagnosis not present

## 2024-09-17 DIAGNOSIS — M62522 Muscle wasting and atrophy, not elsewhere classified, left upper arm: Secondary | ICD-10-CM | POA: Diagnosis not present

## 2024-09-18 DIAGNOSIS — R498 Other voice and resonance disorders: Secondary | ICD-10-CM | POA: Diagnosis not present

## 2024-09-18 DIAGNOSIS — R4185 Anosognosia: Secondary | ICD-10-CM | POA: Diagnosis not present

## 2024-09-18 DIAGNOSIS — R41841 Cognitive communication deficit: Secondary | ICD-10-CM | POA: Diagnosis not present

## 2024-09-18 DIAGNOSIS — R4789 Other speech disturbances: Secondary | ICD-10-CM | POA: Diagnosis not present

## 2024-09-19 DIAGNOSIS — R2681 Unsteadiness on feet: Secondary | ICD-10-CM | POA: Diagnosis not present

## 2024-09-19 DIAGNOSIS — M62522 Muscle wasting and atrophy, not elsewhere classified, left upper arm: Secondary | ICD-10-CM | POA: Diagnosis not present

## 2024-09-19 DIAGNOSIS — M62521 Muscle wasting and atrophy, not elsewhere classified, right upper arm: Secondary | ICD-10-CM | POA: Diagnosis not present

## 2024-09-19 DIAGNOSIS — R488 Other symbolic dysfunctions: Secondary | ICD-10-CM | POA: Diagnosis not present

## 2024-09-19 DIAGNOSIS — R4789 Other speech disturbances: Secondary | ICD-10-CM | POA: Diagnosis not present

## 2024-09-19 DIAGNOSIS — R4185 Anosognosia: Secondary | ICD-10-CM | POA: Diagnosis not present

## 2024-09-19 DIAGNOSIS — R498 Other voice and resonance disorders: Secondary | ICD-10-CM | POA: Diagnosis not present

## 2024-09-19 DIAGNOSIS — R41841 Cognitive communication deficit: Secondary | ICD-10-CM | POA: Diagnosis not present

## 2024-09-22 DIAGNOSIS — M62522 Muscle wasting and atrophy, not elsewhere classified, left upper arm: Secondary | ICD-10-CM | POA: Diagnosis not present

## 2024-09-22 DIAGNOSIS — R2681 Unsteadiness on feet: Secondary | ICD-10-CM | POA: Diagnosis not present

## 2024-09-22 DIAGNOSIS — M62521 Muscle wasting and atrophy, not elsewhere classified, right upper arm: Secondary | ICD-10-CM | POA: Diagnosis not present

## 2024-09-22 DIAGNOSIS — R488 Other symbolic dysfunctions: Secondary | ICD-10-CM | POA: Diagnosis not present

## 2024-09-23 DIAGNOSIS — R41841 Cognitive communication deficit: Secondary | ICD-10-CM | POA: Diagnosis not present

## 2024-09-23 DIAGNOSIS — R4789 Other speech disturbances: Secondary | ICD-10-CM | POA: Diagnosis not present

## 2024-09-23 DIAGNOSIS — R498 Other voice and resonance disorders: Secondary | ICD-10-CM | POA: Diagnosis not present

## 2024-09-23 DIAGNOSIS — R488 Other symbolic dysfunctions: Secondary | ICD-10-CM | POA: Diagnosis not present

## 2024-09-23 DIAGNOSIS — R4185 Anosognosia: Secondary | ICD-10-CM | POA: Diagnosis not present
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
## 2024-09-24 ENCOUNTER — Ambulatory Visit: Attending: Cardiology | Admitting: Cardiology

## 2024-09-24 ENCOUNTER — Encounter: Payer: Self-pay | Admitting: Cardiology

## 2024-09-24 VITALS — BP 138/78 | HR 57 | Resp 16 | Ht 63.0 in | Wt 150.4 lb

## 2024-09-24 DIAGNOSIS — M62522 Muscle wasting and atrophy, not elsewhere classified, left upper arm: Secondary | ICD-10-CM | POA: Diagnosis not present

## 2024-09-24 DIAGNOSIS — I484 Atypical atrial flutter: Secondary | ICD-10-CM

## 2024-09-24 DIAGNOSIS — E871 Hypo-osmolality and hyponatremia: Secondary | ICD-10-CM

## 2024-09-24 DIAGNOSIS — I480 Paroxysmal atrial fibrillation: Secondary | ICD-10-CM | POA: Diagnosis not present

## 2024-09-24 DIAGNOSIS — I10 Essential (primary) hypertension: Secondary | ICD-10-CM

## 2024-09-24 NOTE — Patient Instructions (Signed)
 Medication Instructions:  Your physician recommends that you continue on your current medications as directed. Please refer to the Current Medication list given to you today.   *If you need a refill on your cardiac medications before your next appointment, please call your pharmacy*  Lab Work: None ordered  If you have labs (blood work) drawn today and your tests are completely normal, you will receive your results only by: MyChart Message (if you have MyChart) OR A paper copy in the mail If you have any lab test that is abnormal or we need to change your treatment, we will call you to review the results.  Testing/Procedures: None ordered  Follow-Up: At [facility], you and your health needs are our priority.  As part of our continuing mission to provide you with exceptional heart care, our providers are all part of one team.  This team includes your primary Cardiologist (physician) and Advanced Practice Providers or APPs (Physician Assistants and Nurse Practitioners) who all work together to provide you with the care you need, when you need it.  Your next appointment:   3 month(s)  Provider:   Vinie JAYSON Maxcy, MD    We recommend signing up for the patient portal called MyChart.  Sign up information is provided on this After Visit Summary.  MyChart is used to connect with patients for Virtual Visits (Telemedicine).  Patients are able to view lab/test results, encounter notes, upcoming appointments, etc.  Non-urgent messages can be sent to your provider as well.   To learn more about what you can do with MyChart, go to [email or web address].

## 2024-09-25 ENCOUNTER — Encounter: Payer: Self-pay | Admitting: Cardiology

## 2024-09-25 DIAGNOSIS — R41841 Cognitive communication deficit: Secondary | ICD-10-CM | POA: Diagnosis not present

## 2024-09-25 DIAGNOSIS — R2681 Unsteadiness on feet: Secondary | ICD-10-CM | POA: Diagnosis not present

## 2024-09-25 DIAGNOSIS — R4789 Other speech disturbances: Secondary | ICD-10-CM | POA: Diagnosis not present

## 2024-09-25 DIAGNOSIS — M62521 Muscle wasting and atrophy, not elsewhere classified, right upper arm: Secondary | ICD-10-CM | POA: Diagnosis not present

## 2024-09-25 DIAGNOSIS — R4185 Anosognosia: Secondary | ICD-10-CM | POA: Diagnosis not present

## 2024-09-25 DIAGNOSIS — R498 Other voice and resonance disorders: Secondary | ICD-10-CM | POA: Diagnosis not present

## 2024-09-25 DIAGNOSIS — M62522 Muscle wasting and atrophy, not elsewhere classified, left upper arm: Secondary | ICD-10-CM | POA: Diagnosis not present

## 2024-09-25 DIAGNOSIS — R488 Other symbolic dysfunctions: Secondary | ICD-10-CM | POA: Diagnosis not present

## 2024-09-27 ENCOUNTER — Ambulatory Visit: Admitting: Cardiology

## 2024-09-27 DIAGNOSIS — R2681 Unsteadiness on feet: Secondary | ICD-10-CM | POA: Diagnosis not present

## 2024-09-27 DIAGNOSIS — M62561 Muscle wasting and atrophy, not elsewhere classified, right lower leg: Secondary | ICD-10-CM | POA: Diagnosis not present

## 2024-09-27 DIAGNOSIS — M62562 Muscle wasting and atrophy, not elsewhere classified, left lower leg: Secondary | ICD-10-CM | POA: Diagnosis not present

## 2024-09-30 DIAGNOSIS — R488 Other symbolic dysfunctions: Secondary | ICD-10-CM | POA: Diagnosis not present

## 2024-09-30 DIAGNOSIS — M62562 Muscle wasting and atrophy, not elsewhere classified, left lower leg: Secondary | ICD-10-CM | POA: Diagnosis not present

## 2024-09-30 DIAGNOSIS — R4185 Anosognosia: Secondary | ICD-10-CM | POA: Diagnosis not present

## 2024-09-30 DIAGNOSIS — R498 Other voice and resonance disorders: Secondary | ICD-10-CM | POA: Diagnosis not present

## 2024-09-30 DIAGNOSIS — M62521 Muscle wasting and atrophy, not elsewhere classified, right upper arm: Secondary | ICD-10-CM | POA: Diagnosis not present

## 2024-09-30 DIAGNOSIS — M62561 Muscle wasting and atrophy, not elsewhere classified, right lower leg: Secondary | ICD-10-CM | POA: Diagnosis not present

## 2024-09-30 DIAGNOSIS — R41841 Cognitive communication deficit: Secondary | ICD-10-CM | POA: Diagnosis not present

## 2024-09-30 DIAGNOSIS — M62522 Muscle wasting and atrophy, not elsewhere classified, left upper arm: Secondary | ICD-10-CM | POA: Diagnosis not present

## 2024-09-30 DIAGNOSIS — R2681 Unsteadiness on feet: Secondary | ICD-10-CM | POA: Diagnosis not present

## 2024-09-30 DIAGNOSIS — R4789 Other speech disturbances: Secondary | ICD-10-CM | POA: Diagnosis not present

## 2024-10-01 DIAGNOSIS — M62522 Muscle wasting and atrophy, not elsewhere classified, left upper arm: Secondary | ICD-10-CM | POA: Diagnosis not present

## 2024-10-01 DIAGNOSIS — M62521 Muscle wasting and atrophy, not elsewhere classified, right upper arm: Secondary | ICD-10-CM | POA: Diagnosis not present

## 2024-10-01 DIAGNOSIS — R2681 Unsteadiness on feet: Secondary | ICD-10-CM | POA: Diagnosis not present

## 2024-10-01 DIAGNOSIS — R488 Other symbolic dysfunctions: Secondary | ICD-10-CM | POA: Diagnosis not present

## 2024-10-02 DIAGNOSIS — J9601 Acute respiratory failure with hypoxia: Secondary | ICD-10-CM | POA: Diagnosis not present

## 2024-10-02 DIAGNOSIS — M62562 Muscle wasting and atrophy, not elsewhere classified, left lower leg: Secondary | ICD-10-CM | POA: Diagnosis not present

## 2024-10-02 DIAGNOSIS — R2681 Unsteadiness on feet: Secondary | ICD-10-CM | POA: Diagnosis not present

## 2024-10-02 DIAGNOSIS — R4789 Other speech disturbances: Secondary | ICD-10-CM | POA: Diagnosis not present

## 2024-10-02 DIAGNOSIS — R498 Other voice and resonance disorders: Secondary | ICD-10-CM | POA: Diagnosis not present

## 2024-10-02 DIAGNOSIS — M62521 Muscle wasting and atrophy, not elsewhere classified, right upper arm: Secondary | ICD-10-CM | POA: Diagnosis not present

## 2024-10-02 DIAGNOSIS — R4185 Anosognosia: Secondary | ICD-10-CM | POA: Diagnosis not present

## 2024-10-02 DIAGNOSIS — R41841 Cognitive communication deficit: Secondary | ICD-10-CM | POA: Diagnosis not present

## 2024-10-02 DIAGNOSIS — M62561 Muscle wasting and atrophy, not elsewhere classified, right lower leg: Secondary | ICD-10-CM | POA: Diagnosis not present

## 2024-10-02 DIAGNOSIS — M62522 Muscle wasting and atrophy, not elsewhere classified, left upper arm: Secondary | ICD-10-CM | POA: Diagnosis not present

## 2024-10-03 DIAGNOSIS — H35371 Puckering of macula, right eye: Secondary | ICD-10-CM | POA: Diagnosis not present

## 2024-10-03 DIAGNOSIS — H35433 Paving stone degeneration of retina, bilateral: Secondary | ICD-10-CM | POA: Diagnosis not present

## 2024-10-03 DIAGNOSIS — H43813 Vitreous degeneration, bilateral: Secondary | ICD-10-CM | POA: Diagnosis not present

## 2024-10-03 DIAGNOSIS — H04123 Dry eye syndrome of bilateral lacrimal glands: Secondary | ICD-10-CM | POA: Diagnosis not present

## 2024-10-03 DIAGNOSIS — H35033 Hypertensive retinopathy, bilateral: Secondary | ICD-10-CM | POA: Diagnosis not present

## 2024-10-03 DIAGNOSIS — H353232 Exudative age-related macular degeneration, bilateral, with inactive choroidal neovascularization: Secondary | ICD-10-CM | POA: Diagnosis not present

## 2024-10-04 DIAGNOSIS — R4185 Anosognosia: Secondary | ICD-10-CM | POA: Diagnosis not present

## 2024-10-07 DIAGNOSIS — M62521 Muscle wasting and atrophy, not elsewhere classified, right upper arm: Secondary | ICD-10-CM | POA: Diagnosis not present

## 2024-10-07 DIAGNOSIS — R2681 Unsteadiness on feet: Secondary | ICD-10-CM | POA: Diagnosis not present

## 2024-10-07 DIAGNOSIS — R488 Other symbolic dysfunctions: Secondary | ICD-10-CM | POA: Diagnosis not present

## 2024-10-07 DIAGNOSIS — M62522 Muscle wasting and atrophy, not elsewhere classified, left upper arm: Secondary | ICD-10-CM | POA: Diagnosis not present

## 2024-10-08 DIAGNOSIS — R4185 Anosognosia: Secondary | ICD-10-CM | POA: Diagnosis not present

## 2024-10-08 DIAGNOSIS — M62521 Muscle wasting and atrophy, not elsewhere classified, right upper arm: Secondary | ICD-10-CM | POA: Diagnosis not present

## 2024-10-08 DIAGNOSIS — R2681 Unsteadiness on feet: Secondary | ICD-10-CM | POA: Diagnosis not present

## 2024-10-08 DIAGNOSIS — R488 Other symbolic dysfunctions: Secondary | ICD-10-CM | POA: Diagnosis not present

## 2024-10-08 DIAGNOSIS — M62522 Muscle wasting and atrophy, not elsewhere classified, left upper arm: Secondary | ICD-10-CM | POA: Diagnosis not present

## 2024-10-08 DIAGNOSIS — R41841 Cognitive communication deficit: Secondary | ICD-10-CM | POA: Diagnosis not present

## 2024-10-08 DIAGNOSIS — R498 Other voice and resonance disorders: Secondary | ICD-10-CM | POA: Diagnosis not present

## 2024-10-08 DIAGNOSIS — R4789 Other speech disturbances: Secondary | ICD-10-CM | POA: Diagnosis not present

## 2024-10-09 DIAGNOSIS — R2681 Unsteadiness on feet: Secondary | ICD-10-CM | POA: Diagnosis not present

## 2024-10-09 DIAGNOSIS — R4789 Other speech disturbances: Secondary | ICD-10-CM | POA: Diagnosis not present

## 2024-10-09 DIAGNOSIS — R4185 Anosognosia: Secondary | ICD-10-CM | POA: Diagnosis not present

## 2024-10-09 DIAGNOSIS — M62522 Muscle wasting and atrophy, not elsewhere classified, left upper arm: Secondary | ICD-10-CM | POA: Diagnosis not present

## 2024-10-09 DIAGNOSIS — M62561 Muscle wasting and atrophy, not elsewhere classified, right lower leg: Secondary | ICD-10-CM | POA: Diagnosis not present

## 2024-10-09 DIAGNOSIS — R488 Other symbolic dysfunctions: Secondary | ICD-10-CM | POA: Diagnosis not present

## 2024-10-09 DIAGNOSIS — R498 Other voice and resonance disorders: Secondary | ICD-10-CM | POA: Diagnosis not present

## 2024-10-09 DIAGNOSIS — M62521 Muscle wasting and atrophy, not elsewhere classified, right upper arm: Secondary | ICD-10-CM | POA: Diagnosis not present

## 2024-10-09 DIAGNOSIS — R41841 Cognitive communication deficit: Secondary | ICD-10-CM | POA: Diagnosis not present

## 2024-10-09 DIAGNOSIS — M62562 Muscle wasting and atrophy, not elsewhere classified, left lower leg: Secondary | ICD-10-CM | POA: Diagnosis not present

## 2024-10-10 DIAGNOSIS — M62521 Muscle wasting and atrophy, not elsewhere classified, right upper arm: Secondary | ICD-10-CM | POA: Diagnosis not present

## 2024-10-10 DIAGNOSIS — R4789 Other speech disturbances: Secondary | ICD-10-CM | POA: Diagnosis not present

## 2024-10-10 DIAGNOSIS — R488 Other symbolic dysfunctions: Secondary | ICD-10-CM | POA: Diagnosis not present

## 2024-10-10 DIAGNOSIS — R4185 Anosognosia: Secondary | ICD-10-CM | POA: Diagnosis not present

## 2024-10-10 DIAGNOSIS — M62522 Muscle wasting and atrophy, not elsewhere classified, left upper arm: Secondary | ICD-10-CM | POA: Diagnosis not present

## 2024-10-10 DIAGNOSIS — R2681 Unsteadiness on feet: Secondary | ICD-10-CM | POA: Diagnosis not present

## 2024-10-10 DIAGNOSIS — R498 Other voice and resonance disorders: Secondary | ICD-10-CM | POA: Diagnosis not present

## 2024-10-10 DIAGNOSIS — R41841 Cognitive communication deficit: Secondary | ICD-10-CM | POA: Diagnosis not present

## 2024-10-11 DIAGNOSIS — M62562 Muscle wasting and atrophy, not elsewhere classified, left lower leg: Secondary | ICD-10-CM | POA: Diagnosis not present

## 2024-10-11 DIAGNOSIS — R2681 Unsteadiness on feet: Secondary | ICD-10-CM | POA: Diagnosis not present

## 2024-10-11 DIAGNOSIS — M62561 Muscle wasting and atrophy, not elsewhere classified, right lower leg: Secondary | ICD-10-CM | POA: Diagnosis not present

## 2024-10-14 DIAGNOSIS — R488 Other symbolic dysfunctions: Secondary | ICD-10-CM | POA: Diagnosis not present

## 2024-10-14 DIAGNOSIS — R4185 Anosognosia: Secondary | ICD-10-CM | POA: Diagnosis not present

## 2024-10-14 DIAGNOSIS — R41841 Cognitive communication deficit: Secondary | ICD-10-CM | POA: Diagnosis not present

## 2024-10-14 DIAGNOSIS — R498 Other voice and resonance disorders: Secondary | ICD-10-CM | POA: Diagnosis not present

## 2024-10-14 DIAGNOSIS — R4789 Other speech disturbances: Secondary | ICD-10-CM | POA: Diagnosis not present

## 2024-10-15 DIAGNOSIS — M62521 Muscle wasting and atrophy, not elsewhere classified, right upper arm: Secondary | ICD-10-CM | POA: Diagnosis not present

## 2024-10-15 DIAGNOSIS — M62561 Muscle wasting and atrophy, not elsewhere classified, right lower leg: Secondary | ICD-10-CM | POA: Diagnosis not present

## 2024-10-15 DIAGNOSIS — M62522 Muscle wasting and atrophy, not elsewhere classified, left upper arm: Secondary | ICD-10-CM | POA: Diagnosis not present

## 2024-10-15 DIAGNOSIS — R2681 Unsteadiness on feet: Secondary | ICD-10-CM | POA: Diagnosis not present

## 2024-10-15 DIAGNOSIS — R488 Other symbolic dysfunctions: Secondary | ICD-10-CM | POA: Diagnosis not present

## 2024-10-15 DIAGNOSIS — M62562 Muscle wasting and atrophy, not elsewhere classified, left lower leg: Secondary | ICD-10-CM | POA: Diagnosis not present

## 2024-10-16 DIAGNOSIS — R498 Other voice and resonance disorders: Secondary | ICD-10-CM | POA: Diagnosis not present

## 2024-10-16 DIAGNOSIS — R2681 Unsteadiness on feet: Secondary | ICD-10-CM | POA: Diagnosis not present

## 2024-10-16 DIAGNOSIS — M62522 Muscle wasting and atrophy, not elsewhere classified, left upper arm: Secondary | ICD-10-CM | POA: Diagnosis not present

## 2024-10-16 DIAGNOSIS — R488 Other symbolic dysfunctions: Secondary | ICD-10-CM | POA: Diagnosis not present

## 2024-10-16 DIAGNOSIS — M62561 Muscle wasting and atrophy, not elsewhere classified, right lower leg: Secondary | ICD-10-CM | POA: Diagnosis not present

## 2024-10-16 DIAGNOSIS — R41841 Cognitive communication deficit: Secondary | ICD-10-CM | POA: Diagnosis not present

## 2024-10-16 DIAGNOSIS — R4789 Other speech disturbances: Secondary | ICD-10-CM | POA: Diagnosis not present

## 2024-10-16 DIAGNOSIS — R4185 Anosognosia: Secondary | ICD-10-CM | POA: Diagnosis not present

## 2024-10-16 DIAGNOSIS — M62562 Muscle wasting and atrophy, not elsewhere classified, left lower leg: Secondary | ICD-10-CM | POA: Diagnosis not present

## 2024-10-16 DIAGNOSIS — M62521 Muscle wasting and atrophy, not elsewhere classified, right upper arm: Secondary | ICD-10-CM | POA: Diagnosis not present

## 2024-10-17 DIAGNOSIS — R488 Other symbolic dysfunctions: Secondary | ICD-10-CM | POA: Diagnosis not present

## 2024-10-17 DIAGNOSIS — R4185 Anosognosia: Secondary | ICD-10-CM | POA: Diagnosis not present

## 2024-10-17 DIAGNOSIS — M62522 Muscle wasting and atrophy, not elsewhere classified, left upper arm: Secondary | ICD-10-CM | POA: Diagnosis not present

## 2024-10-17 DIAGNOSIS — R2681 Unsteadiness on feet: Secondary | ICD-10-CM | POA: Diagnosis not present

## 2024-10-17 DIAGNOSIS — R498 Other voice and resonance disorders: Secondary | ICD-10-CM | POA: Diagnosis not present

## 2024-10-17 DIAGNOSIS — M62521 Muscle wasting and atrophy, not elsewhere classified, right upper arm: Secondary | ICD-10-CM | POA: Diagnosis not present

## 2024-10-17 DIAGNOSIS — R41841 Cognitive communication deficit: Secondary | ICD-10-CM | POA: Diagnosis not present

## 2024-10-17 DIAGNOSIS — R4789 Other speech disturbances: Secondary | ICD-10-CM | POA: Diagnosis not present

## 2024-10-20 DIAGNOSIS — R2681 Unsteadiness on feet: Secondary | ICD-10-CM | POA: Diagnosis not present

## 2024-10-20 DIAGNOSIS — M62562 Muscle wasting and atrophy, not elsewhere classified, left lower leg: Secondary | ICD-10-CM | POA: Diagnosis not present

## 2024-10-20 DIAGNOSIS — M62561 Muscle wasting and atrophy, not elsewhere classified, right lower leg: Secondary | ICD-10-CM | POA: Diagnosis not present

## 2024-10-21 ENCOUNTER — Other Ambulatory Visit (HOSPITAL_COMMUNITY): Payer: Self-pay

## 2024-10-21 ENCOUNTER — Other Ambulatory Visit: Payer: Self-pay

## 2024-10-21 DIAGNOSIS — M62521 Muscle wasting and atrophy, not elsewhere classified, right upper arm: Secondary | ICD-10-CM | POA: Diagnosis not present

## 2024-10-21 DIAGNOSIS — M62522 Muscle wasting and atrophy, not elsewhere classified, left upper arm: Secondary | ICD-10-CM | POA: Diagnosis not present

## 2024-10-21 DIAGNOSIS — M62561 Muscle wasting and atrophy, not elsewhere classified, right lower leg: Secondary | ICD-10-CM | POA: Diagnosis not present

## 2024-10-21 DIAGNOSIS — R488 Other symbolic dysfunctions: Secondary | ICD-10-CM | POA: Diagnosis not present

## 2024-10-21 DIAGNOSIS — R2681 Unsteadiness on feet: Secondary | ICD-10-CM | POA: Diagnosis not present

## 2024-10-21 DIAGNOSIS — M62562 Muscle wasting and atrophy, not elsewhere classified, left lower leg: Secondary | ICD-10-CM | POA: Diagnosis not present

## 2024-10-22 DIAGNOSIS — R498 Other voice and resonance disorders: Secondary | ICD-10-CM | POA: Diagnosis not present

## 2024-10-22 DIAGNOSIS — R2681 Unsteadiness on feet: Secondary | ICD-10-CM | POA: Diagnosis not present

## 2024-10-22 DIAGNOSIS — R41841 Cognitive communication deficit: Secondary | ICD-10-CM | POA: Diagnosis not present

## 2024-10-22 DIAGNOSIS — R4789 Other speech disturbances: Secondary | ICD-10-CM | POA: Diagnosis not present

## 2024-10-22 DIAGNOSIS — M62521 Muscle wasting and atrophy, not elsewhere classified, right upper arm: Secondary | ICD-10-CM | POA: Diagnosis not present

## 2024-10-22 DIAGNOSIS — M62522 Muscle wasting and atrophy, not elsewhere classified, left upper arm: Secondary | ICD-10-CM | POA: Diagnosis not present

## 2024-10-22 DIAGNOSIS — R4185 Anosognosia: Secondary | ICD-10-CM | POA: Diagnosis not present

## 2024-10-22 DIAGNOSIS — R488 Other symbolic dysfunctions: Secondary | ICD-10-CM | POA: Diagnosis not present

## 2024-10-23 DIAGNOSIS — M62522 Muscle wasting and atrophy, not elsewhere classified, left upper arm: Secondary | ICD-10-CM | POA: Diagnosis not present

## 2024-10-23 DIAGNOSIS — R41841 Cognitive communication deficit: Secondary | ICD-10-CM | POA: Diagnosis not present

## 2024-10-23 DIAGNOSIS — R488 Other symbolic dysfunctions: Secondary | ICD-10-CM | POA: Diagnosis not present

## 2024-10-23 DIAGNOSIS — M62521 Muscle wasting and atrophy, not elsewhere classified, right upper arm: Secondary | ICD-10-CM | POA: Diagnosis not present

## 2024-10-23 DIAGNOSIS — R498 Other voice and resonance disorders: Secondary | ICD-10-CM | POA: Diagnosis not present

## 2024-10-23 DIAGNOSIS — R4185 Anosognosia: Secondary | ICD-10-CM | POA: Diagnosis not present

## 2024-10-23 DIAGNOSIS — R4789 Other speech disturbances: Secondary | ICD-10-CM | POA: Diagnosis not present

## 2024-10-23 DIAGNOSIS — R2681 Unsteadiness on feet: Secondary | ICD-10-CM | POA: Diagnosis not present

## 2024-10-25 DIAGNOSIS — R488 Other symbolic dysfunctions: Secondary | ICD-10-CM | POA: Diagnosis not present

## 2024-10-25 DIAGNOSIS — R4185 Anosognosia: Secondary | ICD-10-CM | POA: Diagnosis not present

## 2024-10-25 DIAGNOSIS — R41841 Cognitive communication deficit: Secondary | ICD-10-CM | POA: Diagnosis not present

## 2024-10-25 DIAGNOSIS — R4789 Other speech disturbances: Secondary | ICD-10-CM | POA: Diagnosis not present

## 2024-10-25 DIAGNOSIS — R498 Other voice and resonance disorders: Secondary | ICD-10-CM | POA: Diagnosis not present

## 2024-10-28 ENCOUNTER — Other Ambulatory Visit (HOSPITAL_COMMUNITY): Payer: Self-pay

## 2024-12-13 ENCOUNTER — Ambulatory Visit: Attending: Internal Medicine | Admitting: Internal Medicine

## 2024-12-13 ENCOUNTER — Encounter: Payer: Self-pay | Admitting: Internal Medicine

## 2024-12-13 VITALS — BP 130/84 | HR 63 | Ht 63.0 in | Wt 153.2 lb

## 2024-12-13 DIAGNOSIS — I484 Atypical atrial flutter: Secondary | ICD-10-CM | POA: Diagnosis not present

## 2024-12-13 DIAGNOSIS — I10 Essential (primary) hypertension: Secondary | ICD-10-CM | POA: Diagnosis not present

## 2024-12-13 DIAGNOSIS — I480 Paroxysmal atrial fibrillation: Secondary | ICD-10-CM | POA: Diagnosis not present

## 2024-12-13 DIAGNOSIS — Z7901 Long term (current) use of anticoagulants: Secondary | ICD-10-CM

## 2024-12-13 MED ORDER — DRONEDARONE HCL 400 MG PO TABS: 400.0000 mg | ORAL_TABLET | Freq: Two times a day (BID) | ORAL | 3 refills | Status: AC

## 2024-12-13 MED ORDER — DRONEDARONE HCL 400 MG PO TABS: 400.0000 mg | ORAL_TABLET | Freq: Two times a day (BID) | ORAL | Status: DC

## 2024-12-13 NOTE — Patient Instructions (Signed)
 Medication Instructions:  Take Metoprolol  as needed. Start Multaq  400mg  2 times per day. *If you need a refill on your cardiac medications before your next appointment, please call your pharmacy*  Lab Work: None  If you have labs (blood work) drawn today and your tests are completely normal, you will receive your results only by: MyChart Message (if you have MyChart) OR A paper copy in the mail If you have any lab test that is abnormal or we need to change your treatment, we will call you to review the results.  Testing/Procedures: None   Follow-Up: At [facility], you and your health needs are our priority.  As part of our continuing mission to provide you with exceptional heart care, our providers are all part of one team.  This team includes your primary Cardiologist (physician) and Advanced Practice Providers or APPs (Physician Assistants and Nurse Practitioners) who all work together to provide you with the care you need, when you need it.  Your next appointment:   2 week(s)  Provider:   Nurse visit.     We recommend signing up for the patient portal called MyChart.  Sign up information is provided on this After Visit Summary.  MyChart is used to connect with patients for Virtual Visits (Telemedicine).  Patients are able to view lab/test results, encounter notes, upcoming appointments, etc.  Non-urgent messages can be sent to your provider as well.   To learn more about what you can do with MyChart, go to [email or web address].   Other Instructions None
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
## 2024-12-19 ENCOUNTER — Telehealth: Payer: Self-pay | Admitting: Internal Medicine

## 2024-12-19 NOTE — Telephone Encounter (Signed)
 Pt was told to c/b to schedule a nurse visit in 2 weeks for an EKG, please advise if this can scheduled.

## 2024-12-20 NOTE — Telephone Encounter (Signed)
 Called and spoke to pt. She started the new medication (Multaq  400 mg BID) on 12/15/24. She has stopped the metoprolol . She agrees to come in on 12/30/24 at 11:00 am (Lives at [facility]. Living) and will have transportation arranged for this EKG Nurse visit. She is not able to come in on 12/26/24 due to her son is going to be visiting her from [place] . No other concerns.  Buel Ege, RN to do nurse visit after Clinic (QTR day) on 12/30/24.

## 2024-12-21 ENCOUNTER — Telehealth: Payer: Self-pay | Admitting: Physician Assistant

## 2024-12-21 ENCOUNTER — Other Ambulatory Visit: Payer: Self-pay | Admitting: Physician Assistant

## 2024-12-21 MED ORDER — APIXABAN 5 MG PO TABS: 5.0000 mg | ORAL_TABLET | Freq: Two times a day (BID) | ORAL | 3 refills | Status: AC
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
## 2024-12-30 ENCOUNTER — Ambulatory Visit

## 2025-01-01 ENCOUNTER — Telehealth: Payer: Self-pay | Admitting: Internal Medicine

## 2025-01-01 NOTE — Telephone Encounter (Signed)
 Pt needing to reschedule her nurse visit. Please advise.

## 2025-01-01 NOTE — Telephone Encounter (Signed)
 LVMTCB 01/01/25  I [facility]) can be responsible for the nurse visit if pt able to come in on 01/02/25 at 11:00 am.

## 2025-01-06 ENCOUNTER — Ambulatory Visit
